# Patient Record
Sex: Female | Born: 1970 | Race: Black or African American | Hispanic: No | Smoking: Former smoker
Health system: Southern US, Community
[De-identification: ages and names within clinical notes are randomized; demographics above are authoritative.]

## PROBLEM LIST (undated history)

## (undated) DIAGNOSIS — R011 Cardiac murmur, unspecified: Secondary | ICD-10-CM

## (undated) DIAGNOSIS — M1990 Unspecified osteoarthritis, unspecified site: Secondary | ICD-10-CM

## (undated) DIAGNOSIS — Z923 Personal history of irradiation: Secondary | ICD-10-CM

## (undated) DIAGNOSIS — Z973 Presence of spectacles and contact lenses: Secondary | ICD-10-CM

## (undated) DIAGNOSIS — D573 Sickle-cell trait: Secondary | ICD-10-CM

## (undated) DIAGNOSIS — M25511 Pain in right shoulder: Secondary | ICD-10-CM

## (undated) HISTORY — PX: NECK SURGERY: SHX720

## (undated) HISTORY — PX: TUBAL LIGATION: SHX77

## (undated) HISTORY — DX: Sickle-cell trait: D57.3

---

## 2001-07-27 ENCOUNTER — Inpatient Hospital Stay (HOSPITAL_COMMUNITY): Admission: AD | Admit: 2001-07-27 | Discharge: 2001-07-27 | Payer: Self-pay | Admitting: *Deleted

## 2001-10-27 HISTORY — PX: TUBAL LIGATION: SHX77

## 2001-12-23 ENCOUNTER — Ambulatory Visit (HOSPITAL_COMMUNITY): Admission: RE | Admit: 2001-12-23 | Discharge: 2001-12-23 | Payer: Self-pay | Admitting: *Deleted

## 2002-03-29 ENCOUNTER — Inpatient Hospital Stay (HOSPITAL_COMMUNITY): Admission: AD | Admit: 2002-03-29 | Discharge: 2002-03-31 | Payer: Self-pay | Admitting: *Deleted

## 2002-05-07 ENCOUNTER — Inpatient Hospital Stay (HOSPITAL_COMMUNITY): Admission: AD | Admit: 2002-05-07 | Discharge: 2002-05-07 | Payer: Self-pay | Admitting: *Deleted

## 2002-05-08 ENCOUNTER — Inpatient Hospital Stay (HOSPITAL_COMMUNITY): Admission: AD | Admit: 2002-05-08 | Discharge: 2002-05-08 | Payer: Self-pay | Admitting: *Deleted

## 2002-05-19 ENCOUNTER — Encounter (INDEPENDENT_AMBULATORY_CARE_PROVIDER_SITE_OTHER): Payer: Self-pay

## 2002-05-19 ENCOUNTER — Inpatient Hospital Stay (HOSPITAL_COMMUNITY): Admission: AD | Admit: 2002-05-19 | Discharge: 2002-05-23 | Payer: Self-pay | Admitting: *Deleted

## 2002-05-26 ENCOUNTER — Inpatient Hospital Stay (HOSPITAL_COMMUNITY): Admission: AD | Admit: 2002-05-26 | Discharge: 2002-05-26 | Payer: Self-pay | Admitting: *Deleted

## 2002-10-11 ENCOUNTER — Encounter: Payer: Self-pay | Admitting: Emergency Medicine

## 2002-10-11 ENCOUNTER — Emergency Department (HOSPITAL_COMMUNITY): Admission: EM | Admit: 2002-10-11 | Discharge: 2002-10-11 | Payer: Self-pay | Admitting: Emergency Medicine

## 2004-11-19 ENCOUNTER — Other Ambulatory Visit: Admission: RE | Admit: 2004-11-19 | Discharge: 2004-11-19 | Payer: Self-pay | Admitting: Obstetrics and Gynecology

## 2004-11-20 ENCOUNTER — Other Ambulatory Visit: Admission: RE | Admit: 2004-11-20 | Discharge: 2004-11-20 | Payer: Self-pay | Admitting: Obstetrics and Gynecology

## 2008-08-27 ENCOUNTER — Emergency Department (HOSPITAL_COMMUNITY): Admission: EM | Admit: 2008-08-27 | Discharge: 2008-08-27 | Payer: Self-pay | Admitting: Emergency Medicine

## 2008-11-17 ENCOUNTER — Emergency Department (HOSPITAL_COMMUNITY): Admission: EM | Admit: 2008-11-17 | Discharge: 2008-11-17 | Payer: Self-pay | Admitting: Emergency Medicine

## 2011-03-14 NOTE — Op Note (Signed)
[facility]  Patient:    Emily Montoya, Emily Montoya Visit Number: [id number] MRN: [id number]          Service Type: OBS Location: 910A 9128 01 Attending Physician:  Enid Cutter Dictated by:   Mary Sella. Orlene Erm, M.D. Admit Date:  05/19/2002                             Operative Report  NO DICTATION Dictated by:   Mary Sella. Orlene Erm, M.D. Attending Physician:  Enid Cutter DD:  05/20/02 TD:  05/24/02 Job: [id number]

## 2011-03-14 NOTE — Discharge Summary (Signed)
[facility]  Patient:    Emily Montoya, Emily Montoya Visit Number: [id number] MRN: [id number]          Service Type: OBS Location: 910A 9109 01 Attending Physician:  Michaelle Copas Dictated by:   Juanell Fairly, M.D. Admit Date:  03/29/2002 Discharge Date: 03/31/2002                             Discharge Summary  DISCHARGE DIAGNOSIS:          Preterm labor.  DISCHARGE INSTRUCTIONS:       Labor precautions, instructions to refrain for sexual activity or any activity which would cause an organism and a discharge appointment for [facility] on Wednesday, June 11th.  HOSPITAL COURSE:              The patient is a 40 year old gravida 9, P2-0-6-2, who presented at 32-5/7 weeks with contractions for approximately 11 hours.  On admission, the patients tocometer showed contractions every 3 to 4 minutes, baseline heart rate of 140, reactive with good variability and no decelerations, and her cervical exam showed her internal os to be closed, her cervix to be thick and high.  The patient was admitted for preterm labor, placed on Unasyn and terbutaline.  The patient received two days of IV Unasyn and terbutaline overnight.  On the 4th, the patient was given ibuprofen 600 mg q.6h. p.r.n. and on the 5th, having made no cervical change, the patient was discharged home. Dictated by:   Juanell Fairly, M.D. Attending Physician:  Michaelle Copas DD:  04/24/02 TD:  04/26/02 Job: [id number]

## 2011-03-14 NOTE — Discharge Summary (Signed)
NAME:  Emily Montoya, Emily Montoya                       ACCOUNT NO.:  [id number]   MEDICAL RECORD NO.:  [id number]                   PATIENT TYPE:  INP   LOCATION:  9128                                 FACILITY:  [facility]   PHYSICIAN:  Enid Cutter, M.D.                  DATE OF BIRTH:  1971/10/12   DATE OF ADMISSION:  05/19/2002  DATE OF DISCHARGE:  05/23/2002                                 DISCHARGE SUMMARY   ADMISSION DIAGNOSIS:  A 40 year old, G9-P2-0-6-2 at 40 weeks with onset of  labor.   DISCHARGE DIAGNOSIS:  A 40 year old G9-P3-0-6-3, postoperative day #3 status  post a primary cesarean section of a viable boy for failure to progress and  nonreassuring fetal heart rate, BTL.   BRIEF HOSPITAL COURSE:  The patient is a 40 year old G9-P2-0-6-2 presenting  at 40 weeks in labor with contractions and bloody show.  Fetal heart rate  was reassuring but not reactive.  She was in latent labor.  She was admitted  to Labor & Delivery.  At 2115 on 05/19/02, an ARO was performed and found to  have meconium fluid and a large amount of dark blood.  Fetal scalp electrode  was applied and late decelerations were noted with each contraction.  By  2215, the fetal heart rate improved and was reassuring.  However, on  05/20/02 at 0030, the patient's fetal heart rate showed some moderate to  deep variable decelerations and an IUPC was placed with a plan of amnio  infusion.  The patient's cervix was 7-8 cm, abdominus anterior and 75%  effaced.  At 0210, the patient was noted to have a fetal heart rate with  mild to deep variables with late components and intermittent late  decelerations and __________ablity.  At that point, terbutaline 0.25 mg  subcutaneously was given.  At 0414 on 05/20/02, a viable female infant was  delivered by primary cesarean section, performed by Dr. Orlene Erm.  Apgars were  3 at 1 minute, 9 at 3 minutes and 9 at 5 minutes.  The patient also  underwent bilateral tubal sterilization at the  same time.  The procedure was  without complications and a procedure note should be dictated in the record.   Following the cesarean section, ampicillin and clindamycin were initiated  for a fever of 101.3.  The patient will continue on antibiotics until she  was afebrile for 24 hours, which was postoperative day #2.   At discharge, the patient was doing well.  Her incision was clean, dry and  intact with staples, with no sign of infection.  The patient was bottle  feeding and circumcision was performed. There were no signs or symptoms of  endometritis.   DISCHARGE INSTRUCTIONS:  The patient was discharged home with a prescription  for Tylenol with codeine elixir 12/120 per 5 mg.  She is to take 2-4  teaspoons q.4h. as needed, dispensed 50 cc.  This  was given due to the fact  that the patient has difficulty in swallowing large pills.  She was also  given a prescription for ferrous sulfate 325 mg tablets, one p.o. b.i.d. for  anemia of 7.7.  She was also written a prescription for Colace 100 mg p.o.  b.i.d. p.r.n. constipation.  The patient was discharged with staples in  place, mostly due to an overlapping panus.  She was instructed to return to  the MAU in 3 days for removal of the staples.  Otherwise, she is to return  to [facility] for routine follow-up care postpartum.  Wound care  instructions, diet instructions, activity and sexual activity were per  routine.  To call for increased bleeding, passing clots, frequent or painful  urination, or foul-smelling vaginal discharge or fever or chills.     Robbi Spells DICTATOR                          Enid Cutter, M.D.    DD/MEDQ  D:  05/23/2002  T:  05/30/2002  Job:  [id number]

## 2011-03-14 NOTE — Op Note (Signed)
NAME:  Emily Montoya, Emily Montoya                       ACCOUNT NO.:  [id number]   MEDICAL RECORD NO.:  [id number]                   PATIENT TYPE:  INP   LOCATION:  9128                                 FACILITY:  [facility]   PHYSICIAN:  Enid Cutter, M.D.                  DATE OF BIRTH:  16-Jan-1971   DATE OF PROCEDURE:  05/20/2002  DATE OF DISCHARGE:  05/23/2002                                 OPERATIVE REPORT   PREOPERATIVE DIAGNOSIS:  A 40 year old gravida 3, para 2 black female at  term with arrest of labor and nonreassuring fetal tracing, patient with  undesired fertility.   POSTOPERATIVE DIAGNOSIS:  A 40 year old gravida 3, para 2 black female at  term with arrest of labor and nonreassuring fetal tracing, patient with  undesired fertility.   OPERATION:  Primary low transverse C-section followed by tubal ligation.   SURGEON:  Enid Cutter, M.D.   ASSISTANTMarland Kitchen  Wiliam Ke, C.N.M.   ANESTHESIA:  Epidural.   COMPLICATIONS:  None.   SPECIMENS:  Cord gases, cord blood, placenta, and bilateral segments of  fallopian tube.   ESTIMATED BLOOD LOSS:  800 cc.   DISPOSITION:  Recovery room.   FINDINGS:  Viable female infant delivered at 4:14 a.m. with Apgars of 3 at one  minute, 9 at three minutes, and 9 at five minutes, weighing 7 pounds 10  ounces.  Arterial cord pH 7.18.   INDICATIONS FOR PROCEDURE:  The patient is a 40 year old gravida 3, para 2  who presented at term in labor.  She progressed to 8 cm spontaneously.  Her  labor progress slowed after that.  She was internalized with IUPC and was  found to have contractions.  The patient continued to progress to anterior  lip.  She developed a fever.  At this point, there was some fetal  tachycardia and some very variable decelerations.  The patient was given  terbutaline to rest in recovery, and then pushing was attempted to see if  she could push the head past the anterior lip of the cervix.  The patient  did not progress in labor and  continued to have nonreassuring fetal tracing  with repetitive deep variable decelerations.   The decision was made to perform a primary low transverse C-section.  The  patient was counseled on the risk of bleeding, infection, and injury to  internal organs.  In addition, she was counseled on the risk of tubal  ligation including risk of failure 1:200, increased risk of ectopic  gestation, and the permanence of the procedure.  In addition, she was  counseled on alternative forms of birth control.  The patient desired to  proceed.   DESCRIPTION OF PROCEDURE:  The patient was in the operating room.  Her  epidural anesthesia was bolused.  She was prepped and draped in sterile  fashion.  A Pfannenstiel incision was performed with a scalpel and carried  down to underlying fascia.  The fascia was then dissected sharply with Mayo  scissors.  The fascia was then separated from the underlying rectus muscle  bellies, and the peritoneum was entered sharply.  The peritoneum was  extended both superiorly and inferiorly, and the bladder blade was placed.  The bladder flap was created with sharp and blunt dissection.   The bladder blade was replaced, and the uterus was scored with a scalpel.  The uterine incision was performed in the midline, and the infant's head was  delivered.  There was a tight nuchal cord at the time of delivery.  The  infant was DeLee suctioned on the operative field secondary to thick  meconium.  The infant was delivered through the operative incision, and the  cord was clamped and cut.  The infant was handed off to awaiting neonatal  team for resuscitation.  The cord gases and cord blood were drawn.  The  placenta was extracted and sent to pathology.  Uterus was curetted with dry  laps and sponge and externalized.  The uterine incision was repaired with  running locking stitch of 0 chromic.  Several figure-of-eight sutures were  used to obtain final hemostasis.   The  patient's left fallopian tube was identified and grasped with a Babcock  clamp.  A 0 plain gut suture was used to ligate a portion of the tube, and 1  cm segment of tube was excised.  The patient's right fallopian tube was  identified in similar fashion.  It was ligated and segment of tube excised  in similar fashion.  The uterus was returned to the abdominal cavity, and  the tubal sites were inspected and noted to be hemostatic.  The pericolic  gutters were emptied of clot and debris.  The uterine incision was inspected  and noted to be hemostatic.  The fascia was then closed with running stitch  of 0 Vicryl.  The subcutaneous tissues were irrigated with copious amounts  of saline.  The skin edges were reapproximated with staples.  The patient  was taken to the recovery room in stable condition.  Sponge, instrument, and  needle counts were correct at the end of the procedure.                                                Enid Cutter, M.D.    EMH/MEDQ  D:  05/20/2002  T:  05/26/2002  Job:  [id number]   cc:   Wiliam Ke, C.N.M.

## 2018-09-07 ENCOUNTER — Other Ambulatory Visit: Payer: Self-pay

## 2018-09-07 ENCOUNTER — Ambulatory Visit (HOSPITAL_COMMUNITY)
Admission: EM | Admit: 2018-09-07 | Discharge: 2018-09-07 | Disposition: A | Discharge status: Home / Self Care | Payer: Self-pay | Attending: Family Medicine | Admitting: Family Medicine

## 2018-09-07 ENCOUNTER — Encounter (HOSPITAL_COMMUNITY): Payer: Self-pay | Admitting: Emergency Medicine

## 2018-09-07 DIAGNOSIS — R21 Rash and other nonspecific skin eruption: Secondary | ICD-10-CM

## 2018-09-07 MED ORDER — CLOTRIMAZOLE 1 % EX CREA: TOPICAL_CREAM | CUTANEOUS | 0 refills | Status: DC

## 2018-09-07 NOTE — ED Triage Notes (Addendum)
Patient reports noticing pain to lateral right foot, now has a large blister to right foot, lateral.  Denies any new shoes. Symptoms started 2 days ago  Patient also concerned for fine rash to face and few areas of discolored skin for 2 days as well

## 2018-09-07 NOTE — Discharge Instructions (Addendum)
It was nice meeting you!!  We believe this may be some type of fungal infection on the foot We will try clotrimazole cream to treat.  Keep covered and avoid friction with that area of the foot.  You can try some hydrocortisone cream OTC for your face to see if this helps. Zyrtec or benadryl for itching.

## 2018-09-07 NOTE — ED Provider Notes (Signed)
[facility]    CSN: [id number] Arrival date & time: 09/07/18  1336     History   Chief Complaint Chief Complaint  Patient presents with  . Blister    HPI Emily Montoya is a 47 y.o. female.   Patient is a 47 year old female presents for right lateral foot tenderness, peeling of skin with  blister.  This is been going on for the past 3 to 4 days.  The problem initially started with a tenderness to the foot where later a lesion ans then blister formed.  The blister popped and drained clear fluid  and the next day another blister appeared.  The area is very itchy and she has peeling of the foot. She did recently get a Financial controller. She has not been putting anything on the rash. She has not worn any shoes that cause friction   Denies any fever, joint pain. Denies any recent changes in lotions, detergents, foods or other possible irritants. No recent travel. Nobody else at home has the rash. Patient has been outside but denies any contact with plants or insects. No new foods or medications.   ROS per HPI      History reviewed. No pertinent past medical history.  There are no active problems to display for this patient.   History reviewed. No pertinent surgical history.  OB History   None      Home Medications    Prior to Admission medications   Medication Sig Start Date End Date Taking? Authorizing Provider  clotrimazole (LOTRIMIN) 1 % cream Apply to affected area 2 times daily 09/07/18   Orvan July, NP    Family History History reviewed. No pertinent family history.  Social History Social History   Tobacco Use  . Smoking status: Current Every Day Smoker  Substance Use Topics  . Alcohol use: Yes  . Drug use: Never     Allergies   Patient has no known allergies.   Review of Systems Review of Systems   Physical Exam Triage Vital Signs ED Triage Vitals [09/07/18 1432]  Enc Vitals Group     BP 114/61     Pulse Rate 66   Resp 18     Temp 98.4 F (36.9 C)     Temp Source Oral     SpO2 100 %     Weight      Height      Head Circumference      Peak Flow      Pain Score      Pain Loc      Pain Edu?      Excl. in [place]?    No data found.  Updated Vital Signs BP 114/61 (BP Location: Left Arm)   Pulse 66   Temp 98.4 F (36.9 C) (Oral)   Resp 18   LMP 09/07/2018   SpO2 100%   Visual Acuity Right Eye Distance:   Left Eye Distance:   Bilateral Distance:    Right Eye Near:   Left Eye Near:    Bilateral Near:     Physical Exam  Constitutional: She is oriented to person, place, and time. She appears well-developed and well-nourished.  Very pleasant. Non toxic or ill appearing.   HENT:  Head: Normocephalic and atraumatic.  Eyes: Conjunctivae are normal.  Neck: Normal range of motion.  Pulmonary/Chest: Effort normal.  Musculoskeletal: Normal range of motion.  Neurological: She is alert and oriented to person, place, and time.  Skin: Skin  is warm and dry. Rash noted.  See picture for detail for right foot rash   fine papular rash to bilateral cheeks with erythema.   Psychiatric: She has a normal mood and affect.  Nursing note and vitals reviewed.       UC Treatments / Results  Labs (all labs ordered are listed, but only abnormal results are displayed) Labs Reviewed - No data to display  EKG None  Radiology No results found.  Procedures Procedures (including critical care time)  Medications Ordered in UC Medications - No data to display  Initial Impression / Assessment and Plan / UC Course  I have reviewed the triage vital signs and the nursing notes.  Pertinent labs & imaging results that were available during my care of the patient were reviewed by me and considered in my medical decision making (see chart for details).     Pt is a 47 year old female with rash to foot.  Differentials include; bullous  impetigo, fungal, friction blister Contact dermatitis. All of these  can have skin peeling and cause blisters. Will go ahead and treat with fungal cream and steroid to see if this helps Follow up as needed for continued or worsening symptoms  Final Clinical Impressions(s) / UC Diagnoses   Final diagnoses:  Rash     Discharge Instructions     It was nice meeting you!!  We believe this may be some type of fungal infection on the foot We will try clotrimazole cream to treat.  Keep covered and avoid friction with that area of the foot.  You can try some hydrocortisone cream OTC for your face to see if this helps. Zyrtec or benadryl for itching.     ED Prescriptions    Medication Sig Dispense Auth. Provider   clotrimazole (LOTRIMIN) 1 % cream Apply to affected area 2 times daily 15 g Loura Halt A, NP     Controlled Substance Prescriptions [place] Controlled Substance Registry consulted? no   Orvan July, NP 09/07/18 2215

## 2019-06-14 ENCOUNTER — Emergency Department (HOSPITAL_COMMUNITY)
Admission: EM | Admit: 2019-06-14 | Discharge: 2019-06-14 | Disposition: A | Discharge status: Home / Self Care | Payer: Self-pay | Attending: Emergency Medicine | Admitting: Emergency Medicine

## 2019-06-14 ENCOUNTER — Emergency Department (HOSPITAL_COMMUNITY): Payer: Self-pay

## 2019-06-14 ENCOUNTER — Encounter (HOSPITAL_COMMUNITY): Payer: Self-pay | Admitting: Emergency Medicine

## 2019-06-14 ENCOUNTER — Other Ambulatory Visit: Payer: Self-pay

## 2019-06-14 DIAGNOSIS — R109 Unspecified abdominal pain: Secondary | ICD-10-CM | POA: Insufficient documentation

## 2019-06-14 DIAGNOSIS — R1110 Vomiting, unspecified: Secondary | ICD-10-CM | POA: Insufficient documentation

## 2019-06-14 DIAGNOSIS — F17200 Nicotine dependence, unspecified, uncomplicated: Secondary | ICD-10-CM | POA: Insufficient documentation

## 2019-06-14 DIAGNOSIS — R8271 Bacteriuria: Secondary | ICD-10-CM | POA: Insufficient documentation

## 2019-06-14 LAB — CBC WITH DIFFERENTIAL/PLATELET
Abs Immature Granulocytes: 0.12 10*3/uL — ABNORMAL HIGH (ref 0.00–0.07)
Basophils Absolute: 0 10*3/uL (ref 0.0–0.1)
Basophils Relative: 0 %
Eosinophils Absolute: 0 10*3/uL (ref 0.0–0.5)
Eosinophils Relative: 0 %
HCT: 43.3 % (ref 36.0–46.0)
Hemoglobin: 14 g/dL (ref 12.0–15.0)
Immature Granulocytes: 1 %
Lymphocytes Relative: 3 %
Lymphs Abs: 0.3 10*3/uL — ABNORMAL LOW (ref 0.7–4.0)
MCH: 27.6 pg (ref 26.0–34.0)
MCHC: 32.3 g/dL (ref 30.0–36.0)
MCV: 85.2 fL (ref 80.0–100.0)
Monocytes Absolute: 0.3 10*3/uL (ref 0.1–1.0)
Monocytes Relative: 3 %
Neutro Abs: 8.2 10*3/uL — ABNORMAL HIGH (ref 1.7–7.7)
Neutrophils Relative %: 93 %
Platelets: 308 10*3/uL (ref 150–400)
RBC: 5.08 MIL/uL (ref 3.87–5.11)
RDW: 13.7 % (ref 11.5–15.5)
WBC: 8.9 10*3/uL (ref 4.0–10.5)
nRBC: 0 % (ref 0.0–0.2)

## 2019-06-14 LAB — COMPREHENSIVE METABOLIC PANEL
ALT: 15 U/L (ref 0–44)
AST: 22 U/L (ref 15–41)
Albumin: 3.8 g/dL (ref 3.5–5.0)
Alkaline Phosphatase: 53 U/L (ref 38–126)
Anion gap: 12 (ref 5–15)
BUN: 10 mg/dL (ref 6–20)
CO2: 20 mmol/L — ABNORMAL LOW (ref 22–32)
Calcium: 8.8 mg/dL — ABNORMAL LOW (ref 8.9–10.3)
Chloride: 108 mmol/L (ref 98–111)
Creatinine, Ser: 0.72 mg/dL (ref 0.44–1.00)
GFR calc Af Amer: >60 mL/min (ref >60)
GFR calc non Af Amer: >60 mL/min (ref >60)
Glucose, Bld: 92 mg/dL (ref 70–99)
Potassium: 3.3 mmol/L — ABNORMAL LOW (ref 3.5–5.1)
Sodium: 140 mmol/L (ref 135–145)
Total Bilirubin: 0.6 mg/dL (ref 0.3–1.2)
Total Protein: 6.8 g/dL (ref 6.5–8.1)

## 2019-06-14 LAB — URINALYSIS, ROUTINE W REFLEX MICROSCOPIC
Bilirubin Urine: NEGATIVE
Glucose, UA: NEGATIVE mg/dL
Hgb urine dipstick: NEGATIVE
Ketones, ur: NEGATIVE mg/dL
Leukocytes,Ua: NEGATIVE
Nitrite: POSITIVE — AB
Protein, ur: NEGATIVE mg/dL
Specific Gravity, Urine: 1.01 (ref 1.005–1.030)
pH: 6 (ref 5.0–8.0)

## 2019-06-14 LAB — PREGNANCY, URINE: Preg Test, Ur: NEGATIVE

## 2019-06-14 LAB — LIPASE, BLOOD: Lipase: 20 U/L (ref 11–51)

## 2019-06-14 MED ORDER — SODIUM CHLORIDE 0.9 % IV BOLUS
1000.0000 mL | Freq: Once | INTRAVENOUS | Status: AC
  Administered 2019-06-14: 1000 mL via INTRAVENOUS

## 2019-06-14 MED ORDER — MORPHINE SULFATE (PF) 4 MG/ML IV SOLN
4.0000 mg | Freq: Once | INTRAVENOUS | Status: AC
  Administered 2019-06-14: 4 mg via INTRAVENOUS
  Filled 2019-06-14: qty 1

## 2019-06-14 MED ORDER — KETOROLAC TROMETHAMINE 30 MG/ML IJ SOLN
15.0000 mg | Freq: Once | INTRAMUSCULAR | Status: AC
  Administered 2019-06-14: 15 mg via INTRAVENOUS
  Filled 2019-06-14: qty 1

## 2019-06-14 MED ORDER — OXYCODONE HCL 5 MG PO TABS
5.0000 mg | ORAL_TABLET | Freq: Once | ORAL | Status: AC
  Administered 2019-06-14: 5 mg via ORAL
  Filled 2019-06-14: qty 1

## 2019-06-14 MED ORDER — ONDANSETRON HCL 4 MG/2ML IJ SOLN
4.0000 mg | Freq: Once | INTRAMUSCULAR | Status: AC
  Administered 2019-06-14: 4 mg via INTRAVENOUS
  Filled 2019-06-14: qty 2

## 2019-06-14 MED ORDER — CEPHALEXIN 500 MG PO CAPS: 500.0000 mg | ORAL_CAPSULE | Freq: Four times a day (QID) | ORAL | 0 refills | Status: DC

## 2019-06-14 NOTE — ED Provider Notes (Signed)
[facility] DEPT Provider Note   CSN: [id number] Arrival date & time: 06/14/19  0404    History   Chief Complaint Chief Complaint  Patient presents with  . Flank Pain    HPI Emily Montoya is a 48 y.o. female.     48 yo F with a cc R flank pain.  Going on for the past couple hours.  Occurred during a sexual encounter.  Having some emesis.  Pointing to the R abdomen just lateral to the umbilicus.   The history is provided by the patient.  Flank Pain This is a new problem. The current episode started less than 1 hour ago. The problem occurs constantly. The problem has not changed since onset.Pertinent negatives include no chest pain, no abdominal pain, no headaches and no shortness of breath. Nothing aggravates the symptoms. Nothing relieves the symptoms. She has tried nothing for the symptoms. The treatment provided no relief.    History reviewed. No pertinent past medical history.  There are no active problems to display for this patient.   History reviewed. No pertinent surgical history.   OB History   No obstetric history on file.      Home Medications    Prior to Admission medications   Medication Sig Start Date End Date Taking? Authorizing Provider  cephALEXin (KEFLEX) 500 MG capsule Take 1 capsule (500 mg total) by mouth 4 (four) times daily. 06/14/19   Deno Etienne, DO  clotrimazole (LOTRIMIN) 1 % cream Apply to affected area 2 times daily 09/07/18   Orvan July, NP    Family History No family history on file.  Social History Social History   Tobacco Use  . Smoking status: Current Every Day Smoker  . Smokeless tobacco: Never Used  Substance Use Topics  . Alcohol use: Yes  . Drug use: Never     Allergies   Patient has no known allergies.   Review of Systems Review of Systems  Constitutional: Negative for chills and fever.  HENT: Negative for congestion and rhinorrhea.   Eyes: Negative for redness and visual  disturbance.  Respiratory: Negative for shortness of breath and wheezing.   Cardiovascular: Negative for chest pain and palpitations.  Gastrointestinal: Negative for abdominal pain, nausea and vomiting.  Genitourinary: Positive for flank pain. Negative for dysuria and urgency.  Musculoskeletal: Negative for arthralgias and myalgias.  Skin: Negative for pallor and wound.  Neurological: Negative for dizziness and headaches.     Physical Exam Updated Vital Signs BP 110/70 (BP Location: Left Arm)   Pulse (!) 102   Temp 98.8 F (37.1 C) (Oral)   Resp 19   SpO2 100%   Physical Exam Vitals signs and nursing note reviewed.  Constitutional:      General: She is not in acute distress.    Appearance: She is well-developed. She is not diaphoretic.  HENT:     Head: Normocephalic and atraumatic.  Eyes:     Pupils: Pupils are equal, round, and reactive to light.  Neck:     Musculoskeletal: Normal range of motion and neck supple.  Cardiovascular:     Rate and Rhythm: Normal rate and regular rhythm.     Heart sounds: No murmur. No friction rub. No gallop.   Pulmonary:     Effort: Pulmonary effort is normal.     Breath sounds: No wheezing or rales.  Abdominal:     General: There is no distension.     Palpations: Abdomen is soft.  Tenderness: There is abdominal tenderness.     Comments: Mild RLQ tenderness, pain worst just below the R costal margin about the mid axillary line.   Musculoskeletal:        General: No tenderness.  Skin:    General: Skin is warm and dry.  Neurological:     Mental Status: She is alert and oriented to person, place, and time.  Psychiatric:        Behavior: Behavior normal.      ED Treatments / Results  Labs (all labs ordered are listed, but only abnormal results are displayed) Labs Reviewed  URINALYSIS, ROUTINE W REFLEX MICROSCOPIC - Abnormal; Notable for the following components:      Result Value   Nitrite POSITIVE (*)    Bacteria, UA MANY (*)     All other components within normal limits  CBC WITH DIFFERENTIAL/PLATELET - Abnormal; Notable for the following components:   Neutro Abs 8.2 (*)    Lymphs Abs 0.3 (*)    Abs Immature Granulocytes 0.12 (*)    All other components within normal limits  COMPREHENSIVE METABOLIC PANEL - Abnormal; Notable for the following components:   Potassium 3.3 (*)    CO2 20 (*)    Calcium 8.8 (*)    All other components within normal limits  PREGNANCY, URINE  LIPASE, BLOOD    EKG None  Radiology Ct Renal Stone Study  Result Date: 06/14/2019 CLINICAL DATA:  Right flank pain EXAM: CT ABDOMEN AND PELVIS WITHOUT CONTRAST TECHNIQUE: Multidetector CT imaging of the abdomen and pelvis was performed following the standard protocol without IV contrast. COMPARISON:  None. FINDINGS: Lower chest:  Negative Hepatobiliary: No focal liver abnormality.No evidence of biliary obstruction or stone. Pancreas: Unremarkable. Spleen: Unremarkable. Adrenals/Urinary Tract: Negative adrenals. Right hydronephrosis, renal expansion, and perinephric edema. No ureteral calculus is seen - right pelvic and flank calcifications are extra ureteral and attributed to phleboliths. There is a stone within the dependent bladder which measures 4 mm, presumably recently passed. Three right renal calculi measuring up to 5 mm in the lower pole. Negative left kidney. Right upper pole cyst. Stomach/Bowel:  No obstruction. No appendicitis. Vascular/Lymphatic: No acute vascular abnormality. No mass or adenopathy. Reproductive:No pathologic findings. Other: No ascites or pneumoperitoneum. Musculoskeletal: No acute abnormalities. IMPRESSION: 1. Right hydroureteronephrosis likely from recently passed calculus that is layering in the urinary bladder. No ureteral calculus currently. 2. Right renal calculi measuring up to 5 mm. Electronically Signed   By: Monte Fantasia M.D.   On: 06/14/2019 06:55    Procedures Procedures (including critical care time)   Medications Ordered in ED Medications  ketorolac (TORADOL) 30 MG/ML injection 15 mg (has no administration in time range)  oxyCODONE (Oxy IR/ROXICODONE) immediate release tablet 5 mg (has no administration in time range)  morphine 4 MG/ML injection 4 mg (4 mg Intravenous Given 06/14/19 0617)  ondansetron (ZOFRAN) injection 4 mg (4 mg Intravenous Given 06/14/19 0617)  sodium chloride 0.9 % bolus 1,000 mL (1,000 mLs Intravenous New Bag/Given 06/14/19 0616)     Initial Impression / Assessment and Plan / ED Course  I have reviewed the triage vital signs and the nursing notes.  Pertinent labs & imaging results that were available during my care of the patient were reviewed by me and considered in my medical decision making (see chart for details).        48 yo F with a chief complaint of sudden onset right flank pain.  This occurred while she was having  a sexual encounter this evening.  On my exam the patient points to the right of the umbilicus that her pain is more to the mid axillary line.  I wonder if this is a muscular strain.  With her significant pain complaints and vomiting and tachycardia will obtain a CT scan to further evaluate.  UA shows too numerous to count bacteria and nitrite positive.  She has no significant urinary symptoms.  CT scan shows hydroureter on the right.  No obvious stone.  She has a stone in the bladder that is more consistent with possible recently passed stone.  On reassessment the patient continues to state that she has having severe pain.  Still reproducible on the right side.  I suspect that this is a muscular strain, though we will treat her with antibiotics for possible pyelonephritis.  We will have her take Tylenol and ibuprofen at home.  PCP follow-up.  7:05 AM:  I have discussed the diagnosis/risks/treatment options with the patient and believe the pt to be eligible for discharge home to follow-up with PCP. We also discussed returning to the ED immediately if  new or worsening sx occur. We discussed the sx which are most concerning (e.g., sudden worsening pain, fever, inability to tolerate by mouth) that necessitate immediate return. Medications administered to the patient during their visit and any new prescriptions provided to the patient are listed below.  Medications given during this visit Medications  ketorolac (TORADOL) 30 MG/ML injection 15 mg (has no administration in time range)  oxyCODONE (Oxy IR/ROXICODONE) immediate release tablet 5 mg (has no administration in time range)  morphine 4 MG/ML injection 4 mg (4 mg Intravenous Given 06/14/19 0617)  ondansetron (ZOFRAN) injection 4 mg (4 mg Intravenous Given 06/14/19 0617)  sodium chloride 0.9 % bolus 1,000 mL (1,000 mLs Intravenous New Bag/Given 06/14/19 0277)     The patient appears reasonably screen and/or stabilized for discharge and I doubt any other medical condition or other [facility] requiring further screening, evaluation, or treatment in the ED at this time prior to discharge.    Final Clinical Impressions(s) / ED Diagnoses   Final diagnoses:  Right flank pain  Bacteriuria    ED Discharge Orders         Ordered    cephALEXin (KEFLEX) 500 MG capsule  4 times daily     06/14/19 0626           Deno Etienne, DO 06/14/19 [phone]

## 2019-06-14 NOTE — Discharge Instructions (Signed)
Take 4 over the counter ibuprofen tablets 3 times a day or 2 over-the-counter naproxen tablets twice a day for pain. Also take tylenol 1000mg(2 extra strength) four times a day.    

## 2019-06-14 NOTE — ED Triage Notes (Signed)
Patient here via EMS with complaints of right upper flank pain after drinking and having sex with friend. Ambulatory. Patient screaming in triage.

## 2019-06-14 NOTE — ED Notes (Signed)
Pt refused blood work and urine specimen "due to pain". Pt informed refusing labs would delay pt care. Pt requesting pain medicine.

## 2020-10-27 DIAGNOSIS — Z87442 Personal history of urinary calculi: Secondary | ICD-10-CM

## 2020-10-27 HISTORY — DX: Personal history of urinary calculi: Z87.442

## 2021-02-27 ENCOUNTER — Emergency Department (HOSPITAL_COMMUNITY): Payer: 59

## 2021-02-27 ENCOUNTER — Other Ambulatory Visit: Payer: Self-pay

## 2021-02-27 ENCOUNTER — Emergency Department (HOSPITAL_COMMUNITY)
Admission: EM | Admit: 2021-02-27 | Discharge: 2021-02-28 | Disposition: A | Discharge status: Home / Self Care | Payer: 59 | Attending: Emergency Medicine | Admitting: Emergency Medicine

## 2021-02-27 ENCOUNTER — Encounter (HOSPITAL_COMMUNITY): Payer: Self-pay

## 2021-02-27 DIAGNOSIS — M25551 Pain in right hip: Secondary | ICD-10-CM

## 2021-02-27 DIAGNOSIS — R109 Unspecified abdominal pain: Secondary | ICD-10-CM | POA: Insufficient documentation

## 2021-02-27 DIAGNOSIS — F17200 Nicotine dependence, unspecified, uncomplicated: Secondary | ICD-10-CM | POA: Insufficient documentation

## 2021-02-27 LAB — URINALYSIS, ROUTINE W REFLEX MICROSCOPIC
Bilirubin Urine: NEGATIVE
Glucose, UA: NEGATIVE mg/dL
Ketones, ur: NEGATIVE mg/dL
Nitrite: NEGATIVE
Protein, ur: NEGATIVE mg/dL
Specific Gravity, Urine: 1.009 (ref 1.005–1.030)
pH: 6 (ref 5.0–8.0)

## 2021-02-27 LAB — BASIC METABOLIC PANEL
Anion gap: 6 (ref 5–15)
BUN: 10 mg/dL (ref 6–20)
CO2: 23 mmol/L (ref 22–32)
Calcium: 8.6 mg/dL — ABNORMAL LOW (ref 8.9–10.3)
Chloride: 110 mmol/L (ref 98–111)
Creatinine, Ser: 0.82 mg/dL (ref 0.44–1.00)
GFR, Estimated: >60 mL/min (ref >60)
Glucose, Bld: 88 mg/dL (ref 70–99)
Potassium: 3.6 mmol/L (ref 3.5–5.1)
Sodium: 139 mmol/L (ref 135–145)

## 2021-02-27 LAB — CBC WITH DIFFERENTIAL/PLATELET
Abs Immature Granulocytes: 0.03 10*3/uL (ref 0.00–0.07)
Basophils Absolute: 0 10*3/uL (ref 0.0–0.1)
Basophils Relative: 0 %
Eosinophils Absolute: 0.1 10*3/uL (ref 0.0–0.5)
Eosinophils Relative: 1 %
HCT: 36.1 % (ref 36.0–46.0)
Hemoglobin: 11.4 g/dL — ABNORMAL LOW (ref 12.0–15.0)
Immature Granulocytes: 0 %
Lymphocytes Relative: 18 %
Lymphs Abs: 1.7 10*3/uL (ref 0.7–4.0)
MCH: 27.2 pg (ref 26.0–34.0)
MCHC: 31.6 g/dL (ref 30.0–36.0)
MCV: 86.2 fL (ref 80.0–100.0)
Monocytes Absolute: 1 10*3/uL (ref 0.1–1.0)
Monocytes Relative: 10 %
Neutro Abs: 6.4 10*3/uL (ref 1.7–7.7)
Neutrophils Relative %: 71 %
Platelets: 319 10*3/uL (ref 150–400)
RBC: 4.19 MIL/uL (ref 3.87–5.11)
RDW: 13.4 % (ref 11.5–15.5)
WBC: 9.2 10*3/uL (ref 4.0–10.5)
nRBC: 0 % (ref 0.0–0.2)

## 2021-02-27 LAB — I-STAT BETA HCG BLOOD, ED (MC, WL, AP ONLY): I-stat hCG, quantitative: <5 m[IU]/mL (ref <5)

## 2021-02-27 MED ORDER — MORPHINE SULFATE (PF) 4 MG/ML IV SOLN
8.0000 mg | Freq: Once | INTRAVENOUS | Status: AC
  Administered 2021-02-27: 8 mg via INTRAMUSCULAR
  Filled 2021-02-27: qty 2

## 2021-02-27 MED ORDER — KETOROLAC TROMETHAMINE 30 MG/ML IJ SOLN
15.0000 mg | Freq: Once | INTRAMUSCULAR | Status: AC
  Administered 2021-02-27: 15 mg via INTRAVENOUS
  Filled 2021-02-27: qty 1

## 2021-02-27 MED ORDER — METHOCARBAMOL 1000 MG/10ML IJ SOLN
500.0000 mg | Freq: Once | INTRAVENOUS | Status: AC
  Administered 2021-02-27: 500 mg via INTRAVENOUS
  Filled 2021-02-27: qty 500

## 2021-02-27 MED ORDER — HYDROCODONE-ACETAMINOPHEN 5-325 MG PO TABS: 2.0000 | ORAL_TABLET | Freq: Four times a day (QID) | ORAL | 0 refills | Status: DC | PRN

## 2021-02-27 MED ORDER — ONDANSETRON 8 MG PO TBDP
8.0000 mg | ORAL_TABLET | Freq: Once | ORAL | Status: AC
  Administered 2021-02-27: 8 mg via ORAL
  Filled 2021-02-27: qty 1

## 2021-02-27 MED ORDER — SODIUM CHLORIDE 0.9 % IV SOLN: INTRAVENOUS | Status: DC

## 2021-02-27 MED ORDER — CEPHALEXIN 500 MG PO CAPS: 500.0000 mg | ORAL_CAPSULE | Freq: Two times a day (BID) | ORAL | 0 refills | Status: DC

## 2021-02-27 NOTE — ED Provider Notes (Signed)
[facility] DEPT Provider Note   CSN: [id number] Arrival date & time: 02/27/21  1549     History Chief Complaint  Patient presents with  . Right Hip Pain    Emily Montoya is a 50 y.o. female.  50 year old female presents with several days of right-sided pain localized to her inguinal region.  States that pain is sharp and worse with applying pressure.  Denies any direct trauma.  No abdominal discomfort.  No fever or chills.  No back pain.  No bowel or bladder dysfunction.  Pain radiates over to her right gluteal region.  Has been using ibuprofen at home without relief.  Denies any foot drop        History reviewed. No pertinent past medical history.  There are no problems to display for this patient.   History reviewed. No pertinent surgical history.   OB History   No obstetric history on file.     History reviewed. No pertinent family history.  Social History   Tobacco Use  . Smoking status: Current Every Day Smoker  . Smokeless tobacco: Never Used  Substance Use Topics  . Alcohol use: Yes  . Drug use: Never    Home Medications Prior to Admission medications   Medication Sig Start Date End Date Taking? Authorizing Provider  cephALEXin (KEFLEX) 500 MG capsule Take 1 capsule (500 mg total) by mouth 4 (four) times daily. 06/14/19   Deno Etienne, DO  clotrimazole (LOTRIMIN) 1 % cream Apply to affected area 2 times daily 09/07/18   Orvan July, NP    Allergies    Patient has no known allergies.  Review of Systems   Review of Systems  All other systems reviewed and are negative.   Physical Exam Updated Vital Signs BP 121/80   Pulse 81   Temp 98.9 F (37.2 C) (Oral)   Resp 18   SpO2 99%   Physical Exam Vitals and nursing note reviewed.  Constitutional:      General: She is not in acute distress.    Appearance: Normal appearance. She is well-developed. She is not toxic-appearing.  HENT:     Head: Normocephalic and  atraumatic.  Eyes:     General: Lids are normal.     Conjunctiva/sclera: Conjunctivae normal.     Pupils: Pupils are equal, round, and reactive to light.  Neck:     Thyroid: No thyroid mass.     Trachea: No tracheal deviation.  Cardiovascular:     Rate and Rhythm: Normal rate and regular rhythm.     Heart sounds: Normal heart sounds. No murmur heard. No gallop.   Pulmonary:     Effort: Pulmonary effort is normal. No respiratory distress.     Breath sounds: Normal breath sounds. No stridor. No decreased breath sounds, wheezing, rhonchi or rales.  Abdominal:     General: Bowel sounds are normal. There is no distension.     Palpations: Abdomen is soft.     Tenderness: There is no abdominal tenderness. There is no rebound.    Musculoskeletal:        General: No tenderness. Normal range of motion.     Cervical back: Normal range of motion and neck supple.  Skin:    General: Skin is warm and dry.     Findings: No abrasion or rash.  Neurological:     Mental Status: She is alert and oriented to person, place, and time.     GCS: GCS eye subscore is  4. GCS verbal subscore is 5. GCS motor subscore is 6.     Cranial Nerves: No cranial nerve deficit.     Sensory: No sensory deficit.     Comments: Neurovascular intact at right foot.  No joint swelling of the right knee or right ankle.  No rashes to the skin  Psychiatric:        Speech: Speech normal.        Behavior: Behavior normal.     ED Results / Procedures / Treatments   Labs (all labs ordered are listed, but only abnormal results are displayed) Labs Reviewed - No data to display  EKG None  Radiology No results found.  Procedures Procedures   Medications Ordered in ED Medications  morphine 4 MG/ML injection 8 mg (has no administration in time range)  ondansetron (ZOFRAN-ODT) disintegrating tablet 8 mg (has no administration in time range)    ED Course  I have reviewed the triage vital signs and the nursing  notes.  Pertinent labs & imaging results that were available during my care of the patient were reviewed by me and considered in my medical decision making (see chart for details).    MDM Rules/Calculators/A&P                          Patient medicated for pain here.  Plain films negative.  Will order CT of hip.  Signed out to Dr. Tamera Punt Final Clinical Impression(s) / ED Diagnoses Final diagnoses:  None    Rx / DC Orders ED Discharge Orders    None       Lacretia Leigh, MD 02/27/21 1907

## 2021-02-27 NOTE — ED Provider Notes (Addendum)
Patient care was taken over from Dr. Zenia Resides.  Patient presented with right hip pain.  It is worse with movement of the hip and ambulation.  She had a CT of her hip which shows no acute abnormality.  She is tender on palpation over the inguinal area and a little bit in the lower right pelvic area.  Given this, pelvic ultrasound was performed to rule out torsion.  This was unremarkable other than some fibroids.  She says she has had a history of kidney stones.  Her urine was evaluated which showed some hematuria.  Given her significant pain, I will go ahead and perform a CT to make sure that there is no kidney stone.   CT scan shows no acute abnormality.  Her urine has some suggestions of infection.  We will treat with Keflex.  She was discharged home in good condition.  She was given a prescription for a short course of pain medication.  She was encouraged to follow-up with her PCP.  Return precautions were given.   Malvin Johns, MD 02/27/21 [id number]    Malvin Johns, MD 02/27/21 2351

## 2021-02-27 NOTE — ED Triage Notes (Signed)
Pt BIB EMS home. Pt c/o right hip and right leg pain since Monday night. Pt asking for pain meds upon arrival.  HR 88 152/90 98% RA

## 2021-06-06 ENCOUNTER — Other Ambulatory Visit: Payer: Self-pay | Admitting: Internal Medicine

## 2021-06-06 ENCOUNTER — Ambulatory Visit
Admission: RE | Admit: 2021-06-06 | Discharge: 2021-06-06 | Disposition: A | Discharge status: Home / Self Care | Payer: 59 | Source: Ambulatory Visit | Attending: Internal Medicine | Admitting: Internal Medicine

## 2021-06-06 ENCOUNTER — Other Ambulatory Visit: Payer: Self-pay

## 2021-06-06 DIAGNOSIS — M549 Dorsalgia, unspecified: Secondary | ICD-10-CM

## 2021-07-25 ENCOUNTER — Other Ambulatory Visit: Payer: Self-pay | Admitting: Obstetrics and Gynecology

## 2021-07-25 DIAGNOSIS — N63 Unspecified lump in unspecified breast: Secondary | ICD-10-CM

## 2021-07-27 HISTORY — PX: BREAST BIOPSY: SHX20

## 2021-08-14 ENCOUNTER — Ambulatory Visit
Admission: RE | Admit: 2021-08-14 | Discharge: 2021-08-14 | Disposition: A | Discharge status: Home / Self Care | Payer: 59 | Source: Ambulatory Visit | Attending: Obstetrics and Gynecology | Admitting: Obstetrics and Gynecology

## 2021-08-14 ENCOUNTER — Other Ambulatory Visit: Payer: Self-pay

## 2021-08-14 ENCOUNTER — Other Ambulatory Visit: Payer: Self-pay | Admitting: Obstetrics and Gynecology

## 2021-08-14 DIAGNOSIS — N63 Unspecified lump in unspecified breast: Secondary | ICD-10-CM

## 2021-08-20 ENCOUNTER — Other Ambulatory Visit: Payer: Self-pay

## 2021-08-20 ENCOUNTER — Ambulatory Visit
Admission: RE | Admit: 2021-08-20 | Discharge: 2021-08-20 | Disposition: A | Discharge status: Home / Self Care | Payer: 59 | Source: Ambulatory Visit | Attending: Obstetrics and Gynecology | Admitting: Obstetrics and Gynecology

## 2021-08-20 DIAGNOSIS — N63 Unspecified lump in unspecified breast: Secondary | ICD-10-CM

## 2021-08-20 DIAGNOSIS — C50919 Malignant neoplasm of unspecified site of unspecified female breast: Secondary | ICD-10-CM

## 2021-08-20 HISTORY — DX: Malignant neoplasm of unspecified site of unspecified female breast: C50.919

## 2021-08-29 ENCOUNTER — Telehealth: Payer: Self-pay | Admitting: Hematology

## 2021-08-29 ENCOUNTER — Other Ambulatory Visit: Payer: Self-pay

## 2021-08-29 ENCOUNTER — Ambulatory Visit
Admission: RE | Admit: 2021-08-29 | Discharge: 2021-08-29 | Disposition: A | Discharge status: Home / Self Care | Payer: 59 | Source: Ambulatory Visit | Attending: Radiation Oncology | Admitting: Radiation Oncology

## 2021-08-29 ENCOUNTER — Encounter: Payer: Self-pay | Admitting: Radiation Oncology

## 2021-08-29 DIAGNOSIS — Z170 Estrogen receptor positive status [ER+]: Secondary | ICD-10-CM

## 2021-08-29 DIAGNOSIS — C50211 Malignant neoplasm of upper-inner quadrant of right female breast: Secondary | ICD-10-CM

## 2021-08-29 NOTE — Telephone Encounter (Signed)
Scheduled appt per 1/12 referral. Pt is aware of appt date and time.  °

## 2021-08-29 NOTE — Progress Notes (Signed)
New Breast Cancer Diagnosis: Right Breast UIQ  Did patient present with symptoms (if so, please note symptoms) or screening mammography?:Palpable mass noted for about 6 months or so.  She notes her breast was feeling really heavy, thought it was in association to her period.  Location and Extent of disease :right breast. Located at the 1 o'clock position, measured  1.2 cm in greatest dimension. Adenopathy no.  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 Marlou Starks 08/27/2021 -I have discussed with her in detail the different options for treatment and at this point she has not completely decided between breast conservation and mastectomy. -She will let us know what she decides and then we will move forward with scheduling. -I will refer her to medical and radiation oncology to discuss adjuvant therapy. -I will also refer her to physical therapy for preoperative lymphedema testing.    Medical oncologist, treatment if any:   Dr. Burr Medico 08/30/2021   Family History of Breast/Ovarian/Prostate Cancer: None  Lymphedema issues, if any: No     Pain issues, if any: None    SAFETY ISSUES: Prior radiation? no Pacemaker/ICD? No Possible current pregnancy? Having cycles, No birth control Is the patient on methotrexate? No  Current Complaints / other details:   Patient has decided to have lumpectomy, will reach out to Dr. Ethlyn Gallery office with update.

## 2021-08-29 NOTE — Progress Notes (Addendum)
Radiation Oncology         [phone] ________________________________  Initial Outpatient Consultation - Conducted via telephone due to current COVID-19 concerns for limiting patient exposure  I spoke with the patient to conduct this consult visit via telephone to spare the patient unnecessary potential exposure in the healthcare setting during the current COVID-19 pandemic. The patient was notified in advance and was offered a [facility] meeting to allow for face to face communication but unfortunately reported that they did not have the appropriate resources/technology to support such a visit and instead preferred to proceed with a telephone consult.     Name: Emily Montoya        MRN: [id number]  Date of Service: 08/29/2021 DOB: 1971-09-10  WF:UXNA, Emily Flattery, MD  Emily Kussmaul, MD     REFERRING PHYSICIAN: [name], MD   DIAGNOSIS: The encounter diagnosis was Malignant neoplasm of upper-inner quadrant of right breast in female, estrogen receptor positive ([place]).   HISTORY OF PRESENT ILLNESS: Emily Montoya is a 50 y.o. female seen at the request of Dr. Marlou Montoya for a new diagnosis of right breast cancer. The patient was noted to have a   palpable mass of the right diagnostic imaging identified a mass in the 1 o'clock position of the right breast measuring up to 1.2 cm in greatest dimension, no evidence of axillary adenopathy was noted.  She underwent a biopsy on 08/20/2021 that showed a grade 2 invasive ductal carcinoma that was ER/PR positive, HER2 negative with a Ki 67 of 15%. She met with Dr. Marlou Montoya and has been offered breast conservation.  She was also considering mastectomy.  She is still considering her options and is seen today to discuss adjuvant radiotherapy.  She is to meet with Dr. Burr Montoya tomorrow.    PREVIOUS RADIATION THERAPY: No   PAST MEDICAL HISTORY:  Past Medical History:  Diagnosis Date   Breast cancer ([place]) 08/20/2021       PAST SURGICAL HISTORY: Past  Surgical History:  Procedure Laterality Date   TUBAL LIGATION       FAMILY HISTORY: History reviewed. No pertinent family history.   SOCIAL HISTORY:  reports that she quit smoking 7 days ago. Her smoking use included cigarettes. She has never used smokeless tobacco. She reports current alcohol use. She reports that she does not use drugs. The patient is single  and lives in [place]. She works for [employer] remotely from home. She has three children ages 19-32.    ALLERGIES: Meloxicam   MEDICATIONS:  Current Outpatient Medications  Medication Sig Dispense Refill   clotrimazole (LOTRIMIN) 1 % cream Apply to affected area 2 times daily (Patient not taking: No sig reported) 15 g 0   HYDROcodone-acetaminophen (NORCO/VICODIN) 5-325 MG tablet Take 2 tablets by mouth every 6 (six) hours as needed. (Patient not taking: Reported on 08/29/2021) 12 tablet 0   No current facility-administered medications for this encounter.     REVIEW OF SYSTEMS: On review of systems, the patient reports that she is doing okay in processing her new diagnosis. No breast specific complaints are noted.     PHYSICAL EXAM:  Unable to assess due to encounter type    ECOG = 0  0 - Asymptomatic (Fully active, able to carry on all predisease activities without restriction)  1 - Symptomatic but completely ambulatory (Restricted in physically strenuous activity but ambulatory and able to carry out work of a light or sedentary nature. For example, light housework, office  work)  2 - Symptomatic, <50% in bed during the day (Ambulatory and capable of all self care but unable to carry out any work activities. Up and about more than 50% of waking hours)  3 - Symptomatic, >50% in bed, but not bedbound (Capable of only limited self-care, confined to bed or chair 50% or more of waking hours)  4 - Bedbound (Completely disabled. Cannot carry on any self-care. Totally confined to bed or chair)  5 -  Death   Eustace Pen MM, Creech RH, Tormey DC, et al. [phone]). "Toxicity and response criteria of the [facility] Group". [place] Oncol. 5 (6): 649-55    LABORATORY DATA:  Lab Results  Component Value Date   WBC 9.2 02/27/2021   HGB 11.4 (L) 02/27/2021   HCT 36.1 02/27/2021   MCV 86.2 02/27/2021   PLT 319 02/27/2021   Lab Results  Component Value Date   NA 139 02/27/2021   K 3.6 02/27/2021   CL 110 02/27/2021   CO2 23 02/27/2021   Lab Results  Component Value Date   ALT 15 06/14/2019   AST 22 06/14/2019   ALKPHOS 53 06/14/2019   BILITOT 0.6 06/14/2019      RADIOGRAPHY: US BREAST LTD UNI RIGHT INC AXILLA  Result Date: 08/14/2021 CLINICAL DATA:  50 year old female with a palpable right breast lump for approximately 6 months. EXAM: DIGITAL DIAGNOSTIC BILATERAL MAMMOGRAM WITH TOMOSYNTHESIS AND CAD; ULTRASOUND RIGHT BREAST LIMITED TECHNIQUE: Bilateral digital diagnostic mammography and breast tomosynthesis was performed. The images were evaluated with computer-aided detection.; Targeted ultrasound examination of the right breast was performed COMPARISON:  None. ACR Breast Density Category c: The breast tissue is heterogeneously dense, which may obscure small masses. FINDINGS: A radiopaque BB was placed at the site of the patient's palpable lump in the far posterior upper inner right breast. There is a partially visualized spiculated, hyperdense mass deep to the radiopaque BB. Otherwise no suspicious findings in the remainder of either breast. Targeted ultrasound is performed, showing an irregular, hypoechoic mass at the 1 o'clock position 15 cm from the nipple on the right. It measures 1.1 x 1.3 x 0.9 cm. There is associated vascularity. Evaluation of the right axilla demonstrates no suspicious lymphadenopathy. IMPRESSION: 1. Suspicious right breast mass. Recommendation is for ultrasound-guided biopsy. 2. No suspicious right axillary lymphadenopathy. 3. No mammographic  evidence of malignancy on the left. RECOMMENDATION: Ultrasound-guided biopsy of the right breast. I have discussed the findings and recommendations with the patient. If applicable, a reminder letter will be sent to the patient regarding the next appointment. BI-RADS CATEGORY  4: Suspicious. Electronically Signed   By: Kristopher Oppenheim M.D.   On: 08/14/2021 14:23  MM DIAG BREAST TOMO BILATERAL  Result Date: 08/14/2021 CLINICAL DATA:  50 year old female with a palpable right breast lump for approximately 6 months. EXAM: DIGITAL DIAGNOSTIC BILATERAL MAMMOGRAM WITH TOMOSYNTHESIS AND CAD; ULTRASOUND RIGHT BREAST LIMITED TECHNIQUE: Bilateral digital diagnostic mammography and breast tomosynthesis was performed. The images were evaluated with computer-aided detection.; Targeted ultrasound examination of the right breast was performed COMPARISON:  None. ACR Breast Density Category c: The breast tissue is heterogeneously dense, which may obscure small masses. FINDINGS: A radiopaque BB was placed at the site of the patient's palpable lump in the far posterior upper inner right breast. There is a partially visualized spiculated, hyperdense mass deep to the radiopaque BB. Otherwise no suspicious findings in the remainder of either breast. Targeted ultrasound is performed, showing an irregular, hypoechoic mass at the  1 o'clock position 15 cm from the nipple on the right. It measures 1.1 x 1.3 x 0.9 cm. There is associated vascularity. Evaluation of the right axilla demonstrates no suspicious lymphadenopathy. IMPRESSION: 1. Suspicious right breast mass. Recommendation is for ultrasound-guided biopsy. 2. No suspicious right axillary lymphadenopathy. 3. No mammographic evidence of malignancy on the left. RECOMMENDATION: Ultrasound-guided biopsy of the right breast. I have discussed the findings and recommendations with the patient. If applicable, a reminder letter will be sent to the patient regarding the next appointment.  BI-RADS CATEGORY  4: Suspicious. Electronically Signed   By: Kristopher Oppenheim M.D.   On: 08/14/2021 14:23  MM CLIP PLACEMENT RIGHT  Result Date: 08/20/2021 CLINICAL DATA:  Post biopsy mammogram of the right breast for clip placement. EXAM: 3D DIAGNOSTIC RIGHT MAMMOGRAM POST ULTRASOUND BIOPSY COMPARISON:  Previous exam(s). FINDINGS: 3D Mammographic images were obtained following ultrasound guided biopsy of a right breast mass at 1 o'clock. The biopsy marking clip is in expected position at the site of biopsy. Due to the far superior posterior location of the mass, it can only be seen on the MLO view. IMPRESSION: Appropriate positioning of the ribbon shaped biopsy marking clip at the site of biopsy in the upper inner right breast. Final Assessment: Post Procedure Mammograms for Marker Placement Electronically Signed   By: Ammie Ferrier M.D.   On: 08/20/2021 13:51  [name] RT BREAST BX W LOC DEV 1ST LESION IMG BX SPEC US GUIDE  Addendum Date: 08/22/2021   ADDENDUM REPORT: 08/22/2021 11:14 ADDENDUM: Pathology revealed GRADE II INVASIVE DUCTAL CARCINOMA of the RIGHT breast, 1:00 o'clock, 15cmfn, ribbon clip. This was found to be concordant by Dr. Ammie Ferrier. Pathology results were discussed with the patient by telephone. The patient reported doing well after the biopsy with tenderness at the site. Post biopsy instructions and care were reviewed and questions were answered. The patient was encouraged to call [facility] for any additional concerns. Surgical consultation has been arranged with Dr. Autumn Messing at [facility] on August 27, 2021. Pathology results reported by Stacie Acres RN on 08/22/2021. Electronically Signed   By: Ammie Ferrier M.D.   On: 08/22/2021 11:14   Result Date: 08/22/2021 CLINICAL DATA:  50 year old female presenting for ultrasound-guided biopsy of a right breast mass. EXAM: ULTRASOUND GUIDED RIGHT BREAST CORE NEEDLE BIOPSY COMPARISON:   Previous exam(s). PROCEDURE: I met with the patient and we discussed the procedure of ultrasound-guided biopsy, including benefits and alternatives. We discussed the high likelihood of a successful procedure. We discussed the risks of the procedure, including infection, bleeding, tissue injury, clip migration, and inadequate sampling. Informed written consent was given. The usual time-out protocol was performed immediately prior to the procedure. Lesion quadrant: Upper inner quadrant Using sterile technique and 1% Lidocaine as local anesthetic, under direct ultrasound visualization, a 14 gauge spring-loaded device was used to perform biopsy of a mass in the right breast at 1 o'clock, 15 cm from the nipple using an inferior approach. At the conclusion of the procedure a ribbon shaped tissue marker clip was deployed into the biopsy cavity. Follow up 2 view mammogram was performed and dictated separately. IMPRESSION: Ultrasound guided biopsy of a right breast mass at 1 o'clock. No apparent complications. Electronically Signed: By: Ammie Ferrier M.D. On: 08/20/2021 13:30      IMPRESSION/PLAN: 1. Stage IA, [id number] grade 2, ER/PR positive invasive ductal carcinoma of the right breast. Dr. Lisbeth Renshaw discusses the pathology findings and reviews the  nature of early stage breast disease.  She is a good candidate for breast conservation with lumpectomy with sentinel node biopsy. She is also considering her options for mastectomy but is leaning more toward lumpectomy. Depending on the size of the final tumor measurements rendered by pathology, the tumor may be tested for Oncotype Dx score to determine a role for systemic therapy. Provided that chemotherapy is not indicated, the patient's course would then be followed by external radiotherapy to the breast  to reduce risks of local recurrence followed by antiestrogen therapy. We discussed the risks, benefits, short, and long term effects of radiotherapy, as well as the  curative intent, and the patient is interested in proceeding. Dr. Lisbeth Renshaw discusses the delivery and logistics of radiotherapy and anticipates a course of 6 1/2 weeks of radiotherapy to the right breast. We will see her back a few weeks after surgery to discuss the simulation process and anticipate we starting radiotherapy about 4-6 weeks after surgery.  2. Contraceptive Counseling. The patient is having cycles but has had a tubal ligation after her last pregnancy 19 years ago. No pregnancy testing is needed prior to proceeding.   3. Possible genetic predisposition to malignancy. The patient is a candidate for genetic testing given her personal history. She was offered referral and she will be referred urgently as her genetic testing may help determine her surgical decision making.   Given current concerns for patient exposure during the COVID-19 pandemic, this encounter was conducted via telephone.  The patient has provided two factor identification and has given verbal consent for this type of encounter and has been advised to only accept a meeting of this type in a secure network environment. The time spent during this encounter was 60 minutes including preparation, discussion, and coordination of the patient's care. The attendants for this meeting include Blenda Nicely, RN, Dr. Lisbeth Renshaw, Hayden Pedro  and Altamese Iago.  During the encounter,  Blenda Nicely, RN, Dr. Lisbeth Renshaw, and Hayden Pedro were located at [facility] Radiation Oncology Department.  Emily Montoya was located at home.    The above documentation reflects my direct findings during this shared patient visit. Please see the separate note by Dr. Lisbeth Renshaw on this date for the remainder of the patient's plan of care.    Carola Rhine, [facility]    **Disclaimer: This note was dictated with voice recognition software. Similar sounding words can inadvertently be transcribed and this note may contain  transcription errors which may not have been corrected upon publication of note.**

## 2021-08-30 ENCOUNTER — Ambulatory Visit: Payer: 59 | Admitting: Hematology

## 2021-08-30 ENCOUNTER — Telehealth: Payer: Self-pay

## 2021-08-30 NOTE — Telephone Encounter (Signed)
Spoke w/pt regarding appt scheduled for today.  Dr. Burr Medico would like to reschedule the pt's appt for today at 0900 or Monday at 1540.  Dr Burr Medico can also see pt on Tuesday at 1200.  Pt stated she could not come today at 0900 and would prefer to come in on Monday @1540 .  Rescheduled pt's appt to Monday.

## 2021-09-02 ENCOUNTER — Inpatient Hospital Stay: Payer: 59 | Attending: Hematology | Admitting: Hematology

## 2021-09-02 ENCOUNTER — Encounter: Payer: Self-pay | Admitting: Hematology

## 2021-09-02 ENCOUNTER — Other Ambulatory Visit: Payer: Self-pay

## 2021-09-02 VITALS — BP 117/71 | HR 63 | Temp 99.4°F | Resp 17 | Ht 62.5 in | Wt 162.3 lb

## 2021-09-02 DIAGNOSIS — M549 Dorsalgia, unspecified: Secondary | ICD-10-CM | POA: Insufficient documentation

## 2021-09-02 DIAGNOSIS — Z170 Estrogen receptor positive status [ER+]: Secondary | ICD-10-CM | POA: Diagnosis not present

## 2021-09-02 DIAGNOSIS — Z87891 Personal history of nicotine dependence: Secondary | ICD-10-CM | POA: Insufficient documentation

## 2021-09-02 DIAGNOSIS — Z79899 Other long term (current) drug therapy: Secondary | ICD-10-CM | POA: Diagnosis not present

## 2021-09-02 DIAGNOSIS — C50211 Malignant neoplasm of upper-inner quadrant of right female breast: Secondary | ICD-10-CM | POA: Diagnosis present

## 2021-09-02 NOTE — Progress Notes (Addendum)
Emily Montoya   [phone] Fax:([phone]) [place] Note   Patient Care Team: Christophe Louis, MD as PCP - General (Obstetrics and Gynecology)  Date of Service:  09/02/2021   CHIEF COMPLAINTS/PURPOSE OF CONSULTATION:  Right Breast Cancer, ER+  REFERRING PHYSICIAN:  Dr. Marlou Starks   ASSESSMENT & PLAN:  Emily Montoya is a 50 y.o. premenopausal female with   1. Malignant neoplasm of upper-inner quadrant of right breast, Stage IA, c(T1c, N0), ER+/PR+/HER2-, Grade 2  -presented with palpable right breast lump x6 months. B/l diagnostic MM and right breast US 08/14/21 showed 1.3 cm right breast mass at 1 o'clock. No lymphadenopathy or left breast malignancy. Biopsy 08/20/21 showed IDC, grade 2, Ki67 of 15%. --We discussed her imaging findings and the biopsy results in great details. -Given the low grade disease, she likely need a lumpectomy. She is agreeable with that. She was seen by Dr. Marlou Starks on 08/27/21 and likely will proceed with surgery soon.  -I recommend a Oncotype Dx test on the surgical sample and we'll make a decision about adjuvant chemotherapy based on the Oncotype result. Written material of this test was given to her. She is young and fit, would be a good candidate for chemotherapy if her Oncotype recurrence score is high. -If she has positive node, will do Mammaprint  -Giving the strong ER and PR expression in her premenopausal status, I recommend adjuvant endocrine therapy with Tamoxifen for a total of 10 years to reduce the risk of cancer recurrence. Potential benefits and side effects were discussed with patient in detail today and she is interested. -We also discussed the benefit of ovarian suppression and aromatase inhibitor after surgery, especially if she has high risk disease.  Potential side effects were reviewed with her today. -She was also seen by radiation oncologist Dr. Lisbeth Renshaw on 08/29/21. She will likely benefit from breast radiation  if she undergo lumpectomy to decrease the risk of breast cancer.  -We also discussed the breast cancer surveillance after her surgery. She will continue annual screening mammogram, self exam, and a routine office visit with lab and exam with [name]. -I encouraged her to have healthy diet and exercise regularly.   2. Genetic testing -given her young age, she qualifies for genetic testing. She is scheduled for counseling on 09/05/21.  3. Right hip/Back pain -she has had pain since 02/2021. She has undergone CT's and x-rays with no explanation. -I discussed that breast cancer does spread to bones. I will order MRI to rule out metastatic disease. -If negative, I will refer her to orthopedics for further work up.   PLAN:  -proceed with lumpectomy and SLN biopsy per Dr. Marlou Starks soon, to be scheduled -will order Oncotype Dx on her surgical sample -I ordered lumbar MRI to be done soon due to her back pain. Will call her with results  -I will see her 2-3 weeks after surgery to discuss her pathology and oncotype results if needed, or after her RT  -will check lab CBC, CMP and iron study when she has lab draw for genetics    Oncology History  Malignant neoplasm of upper-inner quadrant of right breast in female, estrogen receptor positive ([place])  08/14/2021 Mammogram   EXAM: DIGITAL DIAGNOSTIC BILATERAL MAMMOGRAM WITH TOMOSYNTHESIS AND CAD; ULTRASOUND RIGHT BREAST LIMITED  IMPRESSION: 1. Suspicious right breast mass at the 1 o'clock position 15 cm from the nipple on the right. It measures 1.1 x 1.3 x 0.9 cm. Recommendation is for ultrasound-guided  biopsy. 2. No suspicious right axillary lymphadenopathy. 3. No mammographic evidence of malignancy on the left.   08/20/2021 Pathology Results   Diagnosis Breast, right, needle core biopsy, 1 o'clock, 15 cmfn, ribbon clip - INVASIVE DUCTAL CARCINOMA - SEE COMMENT  Microscopic Comment Based on the biopsy, the carcinoma appears Nottingham grade 2 of 3 and  measures 1.2 cm in greatest linear extent.  PROGNOSTIC INDICATORS Results: The tumor cells are NEGATIVE for Her2 (1+). Estrogen Receptor: 90%, POSITIVE, MODERATE STAINNG INTENSITY Progesterone Receptor: 90%, POSITIVE, STRONG STAINING INTENSITY Proliferation Marker Ki67: 15%   08/20/2021 Cancer Staging   Staging form: Breast, AJCC 8th Edition - Clinical stage from 08/20/2021: Stage IA (cT1c, cN0, cM0, G2, ER+, PR+, HER2-) - Signed by Truitt Merle, MD on 09/02/2021 Method of lymph node assessment: Clinical Histologic grading system: 3 grade system    08/29/2021 Initial Diagnosis   Malignant neoplasm of upper-inner quadrant of right breast in female, estrogen receptor positive ([place])      HISTORY OF PRESENTING ILLNESS:  Emily Montoya 50 y.o. female is a here because of breast cancer. The patient was referred by Dr. Marlou Starks. The patient presents to the clinic today accompanied by her sister and her daughter.   She presented with palpable right breast lump. She underwent bilateral diagnostic mammography and right breast ultrasonography on 08/14/21 showing: suspicious 1.3 cm right breast mass at 1 o'clock; no lymphadenopathy or left breast malignancy. She notes this was her first mammogram.  Biopsy on 08/20/21 showed: invasive ductal carcinoma, grade 2. Prognostic indicators significant for: estrogen receptor, 90% positive and progesterone receptor, 90% positive. Proliferation marker Ki67 at 15%. HER2 negative.   Today the patient notes they felt/feeling prior/after... -she initially noticed the right breast lump about 8 months ago (~12/2020). She reports she brought it to her doctor's attention when she developed pain and tenderness to the area. -she reports she still has some post-biopsy bruising and pain. She notes she is still using ice for pain.   She has a PMHx of.... -sickle cell trait -heart murmur -pain in her right hip/low back for 6 months. She reports the pain is constant,  rated 5-6/10. She initially presented to ED 02/27/21 with intense pain. She notes she was told it's arthritis.   Socially... -she lives at home with her daughter, her son, and several grandchildren. -she works from home -no family history of cancer to her knowledge -she began smoking cigarettes around age [age], ~1/3 ppd. She stopped with her cancer diagnosis.   GYN HISTORY  Menarchal: 42-56 years old LMP: having regular, heavy periods* Contraceptive: s/p tubal ligation, discussed IUD with GYN for periods HRT: n/a GP: 3 *she was supposed to undergo hysterectomy, but this has been postponed due to cancer diagnosis.   REVIEW OF SYSTEMS:    Constitutional: Denies fevers, chills or abnormal night sweats Eyes: Denies blurriness of vision, double vision or watery eyes Ears, nose, mouth, throat, and face: Denies mucositis or sore throat Respiratory: Denies cough, dyspnea or wheezes Cardiovascular: Denies palpitation, chest discomfort or lower extremity swelling Gastrointestinal:  Denies nausea, heartburn or change in bowel habits Skin: Denies abnormal skin rashes Lymphatics: Denies new lymphadenopathy or easy bruising Neurological:Denies numbness, tingling or new weaknesses Behavioral/Psych: Mood is stable, no new changes  All other systems were reviewed with the patient and are negative.   MEDICAL HISTORY:  Past Medical History:  Diagnosis Date   Breast cancer ([place]) 08/20/2021   Sickle cell trait ([place])     SURGICAL HISTORY: Past  Surgical History:  Procedure Laterality Date   TUBAL LIGATION      SOCIAL HISTORY: Social History   Socioeconomic History   Marital status: Legally Separated    Spouse name: Not on file   Number of children: 3   Years of education: Not on file   Highest education level: Not on file  Occupational History   Not on file  Tobacco Use   Smoking status: Former    Packs/day: 0.25    Years: 30.00    Pack years: 7.50    Types: Cigarettes    Quit  date: 08/22/2021    Years since quitting: 0.0   Smokeless tobacco: Never  Substance and Sexual Activity   Alcohol use: Yes    Comment: Occasional Beers   Drug use: Never   Sexual activity: Not on file    Comment: s/p tubal ligation  Other Topics Concern   Not on file  Social History Narrative   Not on file   Social Determinants of Health   Financial Resource Strain: Not on file  Food Insecurity: Not on file  Transportation Needs: Not on file  Physical Activity: Not on file  Stress: Not on file  Social Connections: Not on file  Intimate Partner Violence: Not At Risk   Fear of Current or Ex-Partner: No   Emotionally Abused: No   Physically Abused: No   Sexually Abused: No    FAMILY HISTORY: History reviewed. No pertinent family history.  ALLERGIES:  is allergic to meloxicam.  MEDICATIONS:  Current Outpatient Medications  Medication Sig Dispense Refill   clotrimazole (LOTRIMIN) 1 % cream Apply to affected area 2 times daily (Patient not taking: No sig reported) 15 g 0   No current facility-administered medications for this visit.    PHYSICAL EXAMINATION: ECOG PERFORMANCE STATUS: 0 - Asymptomatic  Vitals:   09/02/21 1559  BP: 117/71  Pulse: 63  Resp: 17  Temp: 99.4 F (37.4 C)  SpO2: 100%   Filed Weights   09/02/21 1559  Weight: 162 lb 4.8 oz (73.6 kg)    GENERAL:alert, no distress and comfortable SKIN: skin color, texture, turgor are normal, no rashes or significant lesions EYES: normal, Conjunctiva are pink and non-injected, sclera clear  NECK: supple, thyroid normal size, non-tender, without nodularity LYMPH:  no palpable lymphadenopathy in the cervical, axillary  LUNGS: clear to auscultation and percussion with normal breathing effort HEART: regular rate & rhythm and no murmurs and no lower extremity edema ABDOMEN:abdomen soft, non-tender and normal bowel sounds Musculoskeletal:no cyanosis of digits and no clubbing  NEURO: alert & oriented x 3 with  fluent speech, no focal motor/sensory deficits BREAST: 1 cm lump in right breast. No palpable adenopathy bilaterally.  LABORATORY DATA:  I have reviewed the data as listed CBC Latest Ref Rng & Units 02/27/2021 06/14/2019  WBC 4.0 - 10.5 K/uL 9.2 8.9  Hemoglobin 12.0 - 15.0 g/dL 11.4(L) 14.0  Hematocrit 36.0 - 46.0 % 36.1 43.3  Platelets 150 - 400 K/uL 319 308    CMP Latest Ref Rng & Units 02/27/2021 06/14/2019  Glucose 70 - 99 mg/dL 88 92  BUN 6 - 20 mg/dL 10 10  Creatinine 0.44 - 1.00 mg/dL 0.82 0.72  Sodium 135 - 145 mmol/L 139 140  Potassium 3.5 - 5.1 mmol/L 3.6 3.3(L)  Chloride 98 - 111 mmol/L 110 108  CO2 22 - 32 mmol/L 23 20(L)  Calcium 8.9 - 10.3 mg/dL 8.6(L) 8.8(L)  Total Protein 6.5 - 8.1 g/dL - 6.8  Total Bilirubin 0.3 - 1.2 mg/dL - 0.6  Alkaline Phos 38 - 126 U/L - 53  AST 15 - 41 U/L - 22  ALT 0 - 44 U/L - 15     RADIOGRAPHIC STUDIES: I have personally reviewed the radiological images as listed and agreed with the findings in the report. US BREAST LTD UNI RIGHT INC AXILLA  Result Date: 08/14/2021 CLINICAL DATA:  50 year old female with a palpable right breast lump for approximately 6 months. EXAM: DIGITAL DIAGNOSTIC BILATERAL MAMMOGRAM WITH TOMOSYNTHESIS AND CAD; ULTRASOUND RIGHT BREAST LIMITED TECHNIQUE: Bilateral digital diagnostic mammography and breast tomosynthesis was performed. The images were evaluated with computer-aided detection.; Targeted ultrasound examination of the right breast was performed COMPARISON:  None. ACR Breast Density Category c: The breast tissue is heterogeneously dense, which may obscure small masses. FINDINGS: A radiopaque BB was placed at the site of the patient's palpable lump in the far posterior upper inner right breast. There is a partially visualized spiculated, hyperdense mass deep to the radiopaque BB. Otherwise no suspicious findings in the remainder of either breast. Targeted ultrasound is performed, showing an irregular, hypoechoic mass  at the 1 o'clock position 15 cm from the nipple on the right. It measures 1.1 x 1.3 x 0.9 cm. There is associated vascularity. Evaluation of the right axilla demonstrates no suspicious lymphadenopathy. IMPRESSION: 1. Suspicious right breast mass. Recommendation is for ultrasound-guided biopsy. 2. No suspicious right axillary lymphadenopathy. 3. No mammographic evidence of malignancy on the left. RECOMMENDATION: Ultrasound-guided biopsy of the right breast. I have discussed the findings and recommendations with the patient. If applicable, a reminder letter will be sent to the patient regarding the next appointment. BI-RADS CATEGORY  4: Suspicious. Electronically Signed   By: Kristopher Oppenheim M.D.   On: 08/14/2021 14:23  MM DIAG BREAST TOMO BILATERAL  Result Date: 08/14/2021 CLINICAL DATA:  50 year old female with a palpable right breast lump for approximately 6 months. EXAM: DIGITAL DIAGNOSTIC BILATERAL MAMMOGRAM WITH TOMOSYNTHESIS AND CAD; ULTRASOUND RIGHT BREAST LIMITED TECHNIQUE: Bilateral digital diagnostic mammography and breast tomosynthesis was performed. The images were evaluated with computer-aided detection.; Targeted ultrasound examination of the right breast was performed COMPARISON:  None. ACR Breast Density Category c: The breast tissue is heterogeneously dense, which may obscure small masses. FINDINGS: A radiopaque BB was placed at the site of the patient's palpable lump in the far posterior upper inner right breast. There is a partially visualized spiculated, hyperdense mass deep to the radiopaque BB. Otherwise no suspicious findings in the remainder of either breast. Targeted ultrasound is performed, showing an irregular, hypoechoic mass at the 1 o'clock position 15 cm from the nipple on the right. It measures 1.1 x 1.3 x 0.9 cm. There is associated vascularity. Evaluation of the right axilla demonstrates no suspicious lymphadenopathy. IMPRESSION: 1. Suspicious right breast mass. Recommendation is  for ultrasound-guided biopsy. 2. No suspicious right axillary lymphadenopathy. 3. No mammographic evidence of malignancy on the left. RECOMMENDATION: Ultrasound-guided biopsy of the right breast. I have discussed the findings and recommendations with the patient. If applicable, a reminder letter will be sent to the patient regarding the next appointment. BI-RADS CATEGORY  4: Suspicious. Electronically Signed   By: Kristopher Oppenheim M.D.   On: 08/14/2021 14:23  MM CLIP PLACEMENT RIGHT  Result Date: 08/20/2021 CLINICAL DATA:  Post biopsy mammogram of the right breast for clip placement. EXAM: 3D DIAGNOSTIC RIGHT MAMMOGRAM POST ULTRASOUND BIOPSY COMPARISON:  Previous exam(s). FINDINGS: 3D Mammographic images were obtained following ultrasound guided biopsy of  a right breast mass at 1 o'clock. The biopsy marking clip is in expected position at the site of biopsy. Due to the far superior posterior location of the mass, it can only be seen on the MLO view. IMPRESSION: Appropriate positioning of the ribbon shaped biopsy marking clip at the site of biopsy in the upper inner right breast. Final Assessment: Post Procedure Mammograms for Marker Placement Electronically Signed   By: Ammie Ferrier M.D.   On: 08/20/2021 13:51  [name] RT BREAST BX W LOC DEV 1ST LESION IMG BX SPEC US GUIDE  Addendum Date: 08/22/2021   ADDENDUM REPORT: 08/22/2021 11:14 ADDENDUM: Pathology revealed GRADE II INVASIVE DUCTAL CARCINOMA of the RIGHT breast, 1:00 o'clock, 15cmfn, ribbon clip. This was found to be concordant by Dr. Ammie Ferrier. Pathology results were discussed with the patient by telephone. The patient reported doing well after the biopsy with tenderness at the site. Post biopsy instructions and care were reviewed and questions were answered. The patient was encouraged to call [facility] for any additional concerns. Surgical consultation has been arranged with Dr. Autumn Messing at [facility] on August 27, 2021. Pathology results reported by Stacie Acres RN on 08/22/2021. Electronically Signed   By: Ammie Ferrier M.D.   On: 08/22/2021 11:14   Result Date: 08/22/2021 CLINICAL DATA:  50 year old female presenting for ultrasound-guided biopsy of a right breast mass. EXAM: ULTRASOUND GUIDED RIGHT BREAST CORE NEEDLE BIOPSY COMPARISON:  Previous exam(s). PROCEDURE: I met with the patient and we discussed the procedure of ultrasound-guided biopsy, including benefits and alternatives. We discussed the high likelihood of a successful procedure. We discussed the risks of the procedure, including infection, bleeding, tissue injury, clip migration, and inadequate sampling. Informed written consent was given. The usual time-out protocol was performed immediately prior to the procedure. Lesion quadrant: Upper inner quadrant Using sterile technique and 1% Lidocaine as local anesthetic, under direct ultrasound visualization, a 14 gauge spring-loaded device was used to perform biopsy of a mass in the right breast at 1 o'clock, 15 cm from the nipple using an inferior approach. At the conclusion of the procedure a ribbon shaped tissue marker clip was deployed into the biopsy cavity. Follow up 2 view mammogram was performed and dictated separately. IMPRESSION: Ultrasound guided biopsy of a right breast mass at 1 o'clock. No apparent complications. Electronically Signed: By: Ammie Ferrier M.D. On: 08/20/2021 13:30    Orders Placed This Encounter  Procedures   MR Lumbar Spine W Wo Contrast    Standing Status:   Future    Standing Expiration Date:   09/02/2022    Order Specific Question:   If indicated for the ordered procedure, I authorize the administration of contrast media per Radiology protocol    Answer:   Yes    Order Specific Question:   What is the patient's sedation requirement?    Answer:   No Sedation    Order Specific Question:   Does the patient have a pacemaker or implanted devices?     Answer:   No    Order Specific Question:   Use SRS Protocol?    Answer:   No    Order Specific Question:   Preferred imaging location?    Answer:   [facility] (table limit - 550 lbs)     All questions were answered. The patient knows to call the clinic with any problems, questions or concerns. The total time spent in the appointment was 47  minutes.     Truitt Merle, MD 09/02/2021 11:15 PM  I, Wilburn Mylar, am acting as scribe for Truitt Merle, MD.   I have reviewed the above documentation for accuracy and completeness, and I agree with the above.

## 2021-09-03 ENCOUNTER — Encounter: Payer: Self-pay | Admitting: *Deleted

## 2021-09-04 ENCOUNTER — Telehealth: Payer: Self-pay | Admitting: *Deleted

## 2021-09-04 NOTE — Telephone Encounter (Signed)
Spoke to pt regarding dx and treatment care plan. Denies questions or concerns.  Pt did ask about starting Tamoxifen prior to sx d/t heavy menses and cramps. Dr. Burr Medico notified. Provided navigation resources and contact information.

## 2021-09-05 ENCOUNTER — Other Ambulatory Visit: Payer: Self-pay

## 2021-09-05 ENCOUNTER — Encounter: Payer: Self-pay | Admitting: Genetic Counselor

## 2021-09-05 ENCOUNTER — Inpatient Hospital Stay (HOSPITAL_BASED_OUTPATIENT_CLINIC_OR_DEPARTMENT_OTHER): Payer: 59 | Admitting: Genetic Counselor

## 2021-09-05 ENCOUNTER — Other Ambulatory Visit: Payer: Self-pay | Admitting: Genetic Counselor

## 2021-09-05 ENCOUNTER — Inpatient Hospital Stay: Payer: 59

## 2021-09-05 DIAGNOSIS — C50211 Malignant neoplasm of upper-inner quadrant of right female breast: Secondary | ICD-10-CM | POA: Diagnosis not present

## 2021-09-05 DIAGNOSIS — Z170 Estrogen receptor positive status [ER+]: Secondary | ICD-10-CM

## 2021-09-05 LAB — GENETIC SCREENING ORDER

## 2021-09-05 NOTE — Progress Notes (Signed)
REFERRING PROVIDER: Truitt Merle, MD [address] [place],  [place] [address]  PRIMARY PROVIDER:  Christophe Louis, MD  PRIMARY REASON FOR VISIT:  1. Malignant neoplasm of upper-inner quadrant of right breast in female, estrogen receptor positive ([place])      HISTORY OF PRESENT ILLNESS:   Emily Montoya, a 50 y.o. female, was seen for a [place] cancer genetics consultation at the request of Dr. Burr Medico due to a personal history of breast cancer.  Emily Montoya presents to clinic today to discuss the possibility of a hereditary predisposition to cancer, genetic testing, and to further clarify her future cancer risks, as well as potential cancer risks for family members.   In October 2022, at the age of 72, Emily Montoya was diagnosed with ductal carcinoma of the right breast. The treatment plan includes lumpectomy, radiation and tamoxifen.     CANCER HISTORY:  Oncology History  Malignant neoplasm of upper-inner quadrant of right breast in female, estrogen receptor positive ([place])  08/14/2021 Mammogram   EXAM: DIGITAL DIAGNOSTIC BILATERAL MAMMOGRAM WITH TOMOSYNTHESIS AND CAD; ULTRASOUND RIGHT BREAST LIMITED  IMPRESSION: 1. Suspicious right breast mass at the 1 o'clock position 15 cm from the nipple on the right. It measures 1.1 x 1.3 x 0.9 cm. Recommendation is for ultrasound-guided biopsy. 2. No suspicious right axillary lymphadenopathy. 3. No mammographic evidence of malignancy on the left.   08/20/2021 Pathology Results   Diagnosis Breast, right, needle core biopsy, 1 o'clock, 15 cmfn, ribbon clip - INVASIVE DUCTAL CARCINOMA - SEE COMMENT  Microscopic Comment Based on the biopsy, the carcinoma appears Nottingham grade 2 of 3 and measures 1.2 cm in greatest linear extent.  PROGNOSTIC INDICATORS Results: The tumor cells are NEGATIVE for Her2 (1+). Estrogen Receptor: 90%, POSITIVE, MODERATE STAINNG INTENSITY Progesterone Receptor: 90%, POSITIVE, STRONG STAINING  INTENSITY Proliferation Marker Ki67: 15%   08/20/2021 Cancer Staging   Staging form: Breast, AJCC 8th Edition - Clinical stage from 08/20/2021: Stage IA (cT1c, cN0, cM0, G2, ER+, PR+, HER2-) - Signed by Truitt Merle, MD on 09/02/2021 Method of lymph node assessment: Clinical Histologic grading system: 3 grade system    08/29/2021 Initial Diagnosis   Malignant neoplasm of upper-inner quadrant of right breast in female, estrogen receptor positive ([place])      RISK FACTORS:  Menarche was at age 50.  First live birth at age 47.  OCP use for approximately  <1  years.  Ovaries intact: yes.  Hysterectomy: no.  Menopausal status: premenopausal.  HRT use: 0 years. Colonoscopy: no; not examined. Mammogram within the last year: yes. Number of breast biopsies: 1. Up to date with pelvic exams: yes. Any excessive radiation exposure in the past: no  Past Medical History:  Diagnosis Date   Breast cancer ([place]) 08/20/2021   Sickle cell trait ([place])     Past Surgical History:  Procedure Laterality Date   TUBAL LIGATION      Social History   Socioeconomic History   Marital status: Legally Separated    Spouse name: Not on file   Number of children: 3   Years of education: Not on file   Highest education level: Not on file  Occupational History   Not on file  Tobacco Use   Smoking status: Former    Packs/day: 0.25    Years: 30.00    Pack years: 7.50    Types: Cigarettes    Quit date: 08/22/2021    Years since quitting: 0.0   Smokeless tobacco: Never  Substance and Sexual  Activity   Alcohol use: Yes    Comment: Occasional Beers   Drug use: Never   Sexual activity: Not on file    Comment: s/p tubal ligation  Other Topics Concern   Not on file  Social History Narrative   Not on file   Social Determinants of Health   Financial Resource Strain: Not on file  Food Insecurity: Not on file  Transportation Needs: Not on file  Physical Activity: Not on file  Stress: Not on file   Social Connections: Not on file     FAMILY HISTORY:  We obtained a detailed, 4-generation family history.  Significant diagnoses are listed below: Family History  Problem Relation Age of Onset   HIV/AIDS Mother      The patient has two boys and a girl who are cancer free.  She has four maternal half brothers and a maternal half sister who are cancer free.  Both parents are deceased.  The patient does not have any information on her paternal side of the family.  The patient's mother died of AIDS at 5.  She has three brothers and two sisters who are cancer free.  Her parents are deceased.  Ms. Salatino is unaware of previous family history of genetic testing for hereditary cancer risks. Patient's maternal ancestors are of African American descent, and paternal ancestors are of African American descent. There is no reported Ashkenazi Jewish ancestry. There is no known consanguinity.  GENETIC COUNSELING ASSESSMENT: Emily Montoya is a 50 y.o. female with a personal history of breast cancer which is somewhat suggestive of a hereditary cancer syndrome and predisposition to cancer given her age of onset and the limited paternal history. We, therefore, discussed and recommended the following at today's visit.   DISCUSSION: We discussed that, in general, most cancer is not inherited in families, but instead is sporadic or familial. Sporadic cancers occur by chance and typically happen at older ages (>50 years) as this type of cancer is caused by genetic changes acquired during an individual's lifetime. Some families have more cancers than would be expected by chance; however, the ages or types of cancer are not consistent with a known genetic mutation or known genetic mutations have been ruled out. This type of familial cancer is thought to be due to a combination of multiple genetic, environmental, hormonal, and lifestyle factors. While this combination of factors likely increases the risk of cancer, the  exact source of this risk is not currently identifiable or testable.  We discussed that 5 - 10% of breast cancer is hereditary, with most cases associated with BRCA mutations.  There are other genes that can be associated with hereditary breast cancer syndromes.  These include ATM, CHEK2 and PALB2.  We discussed that testing is beneficial for several reasons including knowing how to follow individuals after completing their treatment, identifying whether potential treatment options such as PARP inhibitors would be beneficial, and understand if other family members could be at risk for cancer and allow them to undergo genetic testing.   We reviewed the characteristics, features and inheritance patterns of hereditary cancer syndromes. We also discussed genetic testing, including the appropriate family members to test, the process of testing, insurance coverage and turn-around-time for results. We discussed the implications of a negative, positive, carrier and/or variant of uncertain significant result. We recommended Emily Montoya pursue genetic testing for the CancerNext-Expanded+RNAinsight gene panel.   The CancerNext-Expanded gene panel offered by [facility] and includes sequencing and rearrangement analysis for the following  77 genes: AIP, ALK, APC*, ATM*, AXIN2, BAP1, BARD1, BLM, BMPR1A, BRCA1*, BRCA2*, BRIP1*, CDC73, CDH1*, CDK4, CDKN1B, CDKN2A, CHEK2*, CTNNA1, DICER1, FANCC, FH, FLCN, GALNT12, KIF1B, LZTR1, MAX, MEN1, MET, MLH1*, MSH2*, MSH3, MSH6*, MUTYH*, NBN, NF1*, NF2, NTHL1, PALB2*, PHOX2B, PMS2*, POT1, PRKAR1A, PTCH1, PTEN*, RAD51C*, RAD51D*, RB1, RECQL, RET, SDHA, SDHAF2, SDHB, SDHC, SDHD, SMAD4, SMARCA4, SMARCB1, SMARCE1, STK11, SUFU, TMEM127, TP53*, TSC1, TSC2, VHL and XRCC2 (sequencing and deletion/duplication); EGFR, EGLN1, HOXB13, KIT, MITF, PDGFRA, POLD1, and POLE (sequencing only); EPCAM and GREM1 (deletion/duplication only). DNA and RNA analyses performed for * genes.   Based on Ms.  Montoya personal history of cancer, she meets medical criteria for genetic testing. Despite that she meets criteria, she may still have an out of pocket cost. We discussed that if her out of pocket cost for testing is over $100, the laboratory will call and confirm whether she wants to proceed with testing.  If the out of pocket cost of testing is less than $100 she will be billed by the genetic testing laboratory.   PLAN: After considering the risks, benefits, and limitations, Emily Montoya provided informed consent to pursue genetic testing and the blood sample was sent to [facility] for analysis of the CancerNext-Expanded+RNAinsight. Results should be available within approximately 2-3 weeks' time, at which point they will be disclosed by telephone to Emily Montoya, as will any additional recommendations warranted by these results. Emily Montoya will receive a summary of her genetic counseling visit and a copy of her results once available. This information will also be available in Epic.   Lastly, we encouraged Ms. Solan to remain in contact with cancer genetics annually so that we can continuously update the family history and inform her of any changes in cancer genetics and testing that may be of benefit for this family.   Ms. Crotwell questions were answered to her satisfaction today. Our contact information was provided should additional questions or concerns arise. Thank you for the referral and allowing [name] to share in the care of your patient.   Gil Ingwersen P. Florene Glen, [place], [facility] Licensed, Insurance risk surveyor [email or web address] phone: [phone]  The patient was seen for a total of 35 minutes in face-to-face genetic counseling.  The patient brought her sister in law. This patient was discussed with Drs. Magrinat, Lindi Adie and/or Burr Medico who agrees with the above.    _______________________________________________________________________ For Office Staff:  Number of  people involved in session: 2 Was an Intern/ student involved with case: no

## 2021-09-09 ENCOUNTER — Other Ambulatory Visit: Payer: Self-pay | Admitting: *Deleted

## 2021-09-09 ENCOUNTER — Ambulatory Visit (HOSPITAL_COMMUNITY)
Admission: RE | Admit: 2021-09-09 | Discharge: 2021-09-09 | Disposition: A | Discharge status: Home / Self Care | Payer: 59 | Source: Ambulatory Visit | Attending: Hematology | Admitting: Hematology

## 2021-09-09 ENCOUNTER — Telehealth: Payer: Self-pay | Admitting: *Deleted

## 2021-09-09 DIAGNOSIS — C50211 Malignant neoplasm of upper-inner quadrant of right female breast: Secondary | ICD-10-CM | POA: Insufficient documentation

## 2021-09-09 DIAGNOSIS — Z170 Estrogen receptor positive status [ER+]: Secondary | ICD-10-CM | POA: Diagnosis present

## 2021-09-09 MED ORDER — GADOBUTROL 1 MMOL/ML IV SOLN
8.0000 mL | Freq: Once | INTRAVENOUS | Status: AC | PRN
  Administered 2021-09-09: 8 mL via INTRAVENOUS

## 2021-09-09 MED ORDER — TAMOXIFEN CITRATE 20 MG PO TABS: 20.0000 mg | ORAL_TABLET | Freq: Every day | ORAL | 0 refills | Status: DC

## 2021-09-09 NOTE — Telephone Encounter (Signed)
Pt called regarding tamoxifen prior to surgery d/t heavy menses. Pt was scheduled to have a hysterectomy but was cx d/t dx. Per Dr. Burr Medico prescription sent for Tamoxifen. Pt to stop taking 1 wk prior to sx. Called pt to provide instructions. No answer unable to leave vm. Mychart msg sent with instructions.

## 2021-09-11 ENCOUNTER — Ambulatory Visit: Payer: Self-pay | Admitting: General Surgery

## 2021-09-11 ENCOUNTER — Encounter: Payer: Self-pay | Admitting: *Deleted

## 2021-09-11 DIAGNOSIS — Z170 Estrogen receptor positive status [ER+]: Secondary | ICD-10-CM

## 2021-09-11 DIAGNOSIS — C50211 Malignant neoplasm of upper-inner quadrant of right female breast: Secondary | ICD-10-CM

## 2021-09-12 ENCOUNTER — Other Ambulatory Visit: Payer: Self-pay | Admitting: General Surgery

## 2021-09-12 ENCOUNTER — Encounter: Payer: Self-pay | Admitting: *Deleted

## 2021-09-12 DIAGNOSIS — C50211 Malignant neoplasm of upper-inner quadrant of right female breast: Secondary | ICD-10-CM

## 2021-09-16 ENCOUNTER — Telehealth: Payer: Self-pay | Admitting: *Deleted

## 2021-09-16 ENCOUNTER — Ambulatory Visit: Payer: 59 | Admitting: Physical Therapy

## 2021-09-16 NOTE — Telephone Encounter (Signed)
-----   Message from Truitt Merle, MD sent at 09/15/2021 12:13 PM EST ----- Please let pt know her MRI results, it looks good except mild degenerative arthritis. She can f/u with her PCP for her back pain if needed.   Truitt Merle  09/15/2021

## 2021-09-16 NOTE — Telephone Encounter (Signed)
Per Dr.Feng, called pt with message below. Pt verbalized understanding. Advised to call office with concerns if needed.

## 2021-09-16 NOTE — Telephone Encounter (Signed)
Awaiting Leave form from patient.  Connected to request type of leave, start date of two part leave application and form received blank for either FMLA or Non-FMLA.  Provided fax number [phone]) and e-mail [email or web address].  "Leave for all medical appointments I've had and pending surgery.  Dr. Burr Medico said to bring form to her.  Plan to work through 10/09/2021 and do not know how long I'll need to be out of work."

## 2021-09-23 ENCOUNTER — Telehealth: Payer: Self-pay | Admitting: Genetic Counselor

## 2021-09-23 ENCOUNTER — Encounter: Payer: Self-pay | Admitting: Genetic Counselor

## 2021-09-23 ENCOUNTER — Ambulatory Visit: Payer: Self-pay | Admitting: Genetic Counselor

## 2021-09-23 DIAGNOSIS — Z1379 Encounter for other screening for genetic and chromosomal anomalies: Secondary | ICD-10-CM

## 2021-09-23 NOTE — Progress Notes (Signed)
HPI:  Ms. Shannon was previously seen in the [place] clinic due to a personal history of breast cancer and concerns regarding a hereditary predisposition to cancer. Please refer to our prior cancer genetics clinic note for more information regarding our discussion, assessment and recommendations, at the time. Ms. Litaker recent genetic test results were disclosed to her, as were recommendations warranted by these results. These results and recommendations are discussed in more detail below.  CANCER HISTORY:  Oncology History  Malignant neoplasm of upper-inner quadrant of right breast in female, estrogen receptor positive ([place])  08/14/2021 Mammogram   EXAM: DIGITAL DIAGNOSTIC BILATERAL MAMMOGRAM WITH TOMOSYNTHESIS AND CAD; ULTRASOUND RIGHT BREAST LIMITED  IMPRESSION: 1. Suspicious right breast mass at the 1 o'clock position 15 cm from the nipple on the right. It measures 1.1 x 1.3 x 0.9 cm. Recommendation is for ultrasound-guided biopsy. 2. No suspicious right axillary lymphadenopathy. 3. No mammographic evidence of malignancy on the left.   08/20/2021 Pathology Results   Diagnosis Breast, right, needle core biopsy, 1 o'clock, 15 cmfn, ribbon clip - INVASIVE DUCTAL CARCINOMA - SEE COMMENT  Microscopic Comment Based on the biopsy, the carcinoma appears Nottingham grade 2 of 3 and measures 1.2 cm in greatest linear extent.  PROGNOSTIC INDICATORS Results: The tumor cells are NEGATIVE for Her2 (1+). Estrogen Receptor: 90%, POSITIVE, MODERATE STAINNG INTENSITY Progesterone Receptor: 90%, POSITIVE, STRONG STAINING INTENSITY Proliferation Marker Ki67: 15%   08/20/2021 Cancer Staging   Staging form: Breast, AJCC 8th Edition - Clinical stage from 08/20/2021: Stage IA (cT1c, cN0, cM0, G2, ER+, PR+, HER2-) - Signed by Truitt Merle, MD on 09/02/2021 Method of lymph node assessment: Clinical Histologic grading system: 3 grade system    08/29/2021 Initial Diagnosis    Malignant neoplasm of upper-inner quadrant of right breast in female, estrogen receptor positive ([place])   09/22/2021 Genetic Testing   Negative genetic testing on the CancerNext-Expanded+RNAinsight panel.  The report date is September 20, 2021.  The CancerNext-Expanded gene panel offered by [facility] and includes sequencing and rearrangement analysis for the following 77 genes: AIP, ALK, APC*, ATM*, AXIN2, BAP1, BARD1, BLM, BMPR1A, BRCA1*, BRCA2*, BRIP1*, CDC73, CDH1*, CDK4, CDKN1B, CDKN2A, CHEK2*, CTNNA1, DICER1, FANCC, FH, FLCN, GALNT12, KIF1B, LZTR1, MAX, MEN1, MET, MLH1*, MSH2*, MSH3, MSH6*, MUTYH*, NBN, NF1*, NF2, NTHL1, PALB2*, PHOX2B, PMS2*, POT1, PRKAR1A, PTCH1, PTEN*, RAD51C*, RAD51D*, RB1, RECQL, RET, SDHA, SDHAF2, SDHB, SDHC, SDHD, SMAD4, SMARCA4, SMARCB1, SMARCE1, STK11, SUFU, TMEM127, TP53*, TSC1, TSC2, VHL and XRCC2 (sequencing and deletion/duplication); EGFR, EGLN1, HOXB13, KIT, MITF, PDGFRA, POLD1, and POLE (sequencing only); EPCAM and GREM1 (deletion/duplication only). DNA and RNA analyses performed for * genes.     FAMILY HISTORY:  We obtained a detailed, 4-generation family history.  Significant diagnoses are listed below: Family History  Problem Relation Age of Onset   HIV/AIDS Mother       The patient has two boys and a girl who are cancer free.  She has four maternal half brothers and a maternal half sister who are cancer free.  Both parents are deceased.   The patient does not have any information on her paternal side of the family.   The patient's mother died of AIDS at 36.  She has three brothers and two sisters who are cancer free.  Her parents are deceased.   Ms. Bunker is unaware of previous family history of genetic testing for hereditary cancer risks. Patient's maternal ancestors are of African American descent, and paternal ancestors are of African American descent. There is  no reported Ashkenazi Jewish ancestry. There is no known consanguinity.  GENETIC  TEST RESULTS: Genetic testing reported out on September 22, 2021 through the CancerNext-Expanded+RNAinsight cancer panel found no pathogenic mutations. The CancerNext-Expanded gene panel offered by [facility] and includes sequencing and rearrangement analysis for the following 77 genes: AIP, ALK, APC*, ATM*, AXIN2, BAP1, BARD1, BLM, BMPR1A, BRCA1*, BRCA2*, BRIP1*, CDC73, CDH1*, CDK4, CDKN1B, CDKN2A, CHEK2*, CTNNA1, DICER1, FANCC, FH, FLCN, GALNT12, KIF1B, LZTR1, MAX, MEN1, MET, MLH1*, MSH2*, MSH3, MSH6*, MUTYH*, NBN, NF1*, NF2, NTHL1, PALB2*, PHOX2B, PMS2*, POT1, PRKAR1A, PTCH1, PTEN*, RAD51C*, RAD51D*, RB1, RECQL, RET, SDHA, SDHAF2, SDHB, SDHC, SDHD, SMAD4, SMARCA4, SMARCB1, SMARCE1, STK11, SUFU, TMEM127, TP53*, TSC1, TSC2, VHL and XRCC2 (sequencing and deletion/duplication); EGFR, EGLN1, HOXB13, KIT, MITF, PDGFRA, POLD1, and POLE (sequencing only); EPCAM and GREM1 (deletion/duplication only). DNA and RNA analyses performed for * genes. The test report has been scanned into EPIC and is located under the Molecular Pathology section of the Results Review tab.  A portion of the result report is included below for reference.     We discussed with Ms. Tuckerman that because current genetic testing is not perfect, it is possible there may be a gene mutation in one of these genes that current testing cannot detect, but that chance is small.  We also discussed, that there could be another gene that has not yet been discovered, or that we have not yet tested, that is responsible for the cancer diagnoses in the family. It is also possible there is a hereditary cause for the cancer in the family that Ms. Kauffman did not inherit and therefore was not identified in her testing.  Therefore, it is important to remain in touch with cancer genetics in the future so that we can continue to offer Ms. Mcferran the most up to date genetic testing.   ADDITIONAL GENETIC TESTING: We discussed with Ms. Spragg that her genetic  testing was fairly extensive.  If there are genes identified to increase cancer risk that can be analyzed in the future, we would be happy to discuss and coordinate this testing at that time.    CANCER SCREENING RECOMMENDATIONS: Ms. Nordmann test result is considered negative (normal).  This means that we have not identified a hereditary cause for her personal history of breast cancer at this time. Most cancers happen by chance and this negative test suggests that her cancer may fall into this category.    While reassuring, this does not definitively rule out a hereditary predisposition to cancer. It is still possible that there could be genetic mutations that are undetectable by current technology. There could be genetic mutations in genes that have not been tested or identified to increase cancer risk.  Therefore, it is recommended she continue to follow the cancer management and screening guidelines provided by her oncology and primary healthcare provider.   An individual's cancer risk and medical management are not determined by genetic test results alone. Overall cancer risk assessment incorporates additional factors, including personal medical history, family history, and any available genetic information that may result in a personalized plan for cancer prevention and surveillance  RECOMMENDATIONS FOR FAMILY MEMBERS:  Individuals in this family might be at some increased risk of developing cancer, over the general population risk, simply due to the family history of cancer.  We recommended women in this family have a yearly mammogram beginning at age 18, or 28 years younger than the earliest onset of cancer, an annual clinical breast exam, and perform monthly breast  self-exams. Women in this family should also have a gynecological exam as recommended by their primary provider. All family members should be referred for colonoscopy starting at age 32.  FOLLOW-UP: Lastly, we discussed with Ms. Lomba  that cancer genetics is a rapidly advancing field and it is possible that new genetic tests will be appropriate for her and/or her family members in the future. We encouraged her to remain in contact with cancer genetics on an annual basis so we can update her personal and family histories and let her know of advances in cancer genetics that may benefit this family.   Our contact number was provided. Ms. Rader questions were answered to her satisfaction, and she knows she is welcome to call us at anytime with additional questions or concerns.   Roma Kayser, [place], [facility] Licensed, Certified Genetic Counselor [email or web address]

## 2021-09-23 NOTE — Telephone Encounter (Signed)
Revealed negative genetic testing.  Discussed that we do not know why she has breast cancer. It could be due to a different gene that we are not testing, or maybe our current technology may not be able to pick something up.  It will be important for her to keep in contact with genetics to keep up with whether additional testing may be needed.    

## 2021-09-26 DIAGNOSIS — D649 Anemia, unspecified: Secondary | ICD-10-CM

## 2021-09-26 HISTORY — DX: Anemia, unspecified: D64.9

## 2021-09-26 HISTORY — PX: BREAST LUMPECTOMY: SHX2

## 2021-10-01 ENCOUNTER — Encounter: Payer: Self-pay | Admitting: Physical Therapy

## 2021-10-01 ENCOUNTER — Ambulatory Visit: Payer: 59 | Attending: General Surgery | Admitting: Physical Therapy

## 2021-10-01 ENCOUNTER — Other Ambulatory Visit: Payer: Self-pay

## 2021-10-01 DIAGNOSIS — R293 Abnormal posture: Secondary | ICD-10-CM | POA: Diagnosis not present

## 2021-10-01 DIAGNOSIS — M79604 Pain in right leg: Secondary | ICD-10-CM | POA: Diagnosis present

## 2021-10-01 DIAGNOSIS — C50211 Malignant neoplasm of upper-inner quadrant of right female breast: Secondary | ICD-10-CM | POA: Diagnosis present

## 2021-10-01 DIAGNOSIS — Z170 Estrogen receptor positive status [ER+]: Secondary | ICD-10-CM | POA: Insufficient documentation

## 2021-10-01 NOTE — Therapy (Signed)
[facility] @ [facility] [place], [place], [address] Phone: [phone]   Fax:  [phone]  Physical Therapy Evaluation  Patient Details  Name: Emily Montoya MRN: [id number] Date of Birth: 1971/10/03 Referring Provider (PT): Joaquin Courts Date: 10/01/2021   PT End of Session - 10/01/21 1000     Visit Number 1    Number of Visits 2    Date for PT Re-Evaluation 10/29/21    PT Start Time 0906    PT Stop Time 1000    PT Time Calculation (min) 54 min    Activity Tolerance Patient tolerated treatment well    Behavior During Therapy [facility] for tasks assessed/performed             Past Medical History:  Diagnosis Date   Breast cancer ([place]) 08/20/2021   Sickle cell trait ([place])     Past Surgical History:  Procedure Laterality Date   TUBAL LIGATION      There were no vitals filed for this visit.    Subjective Assessment - 10/01/21 0907     Subjective I feel ok. I don't have any shoulder issues. I have been having pain in my right side that began last year in May. I couldn't walk. I could not get out of the chair and I had to call the ambulance. Lab work and x rays were clear. I have pain in my lower back and down my right leg. It feels like it is in the front of my leg. I have numbness and tingling in my right leg and foot.    Pertinent History R breast cancer ER+PR+, plan is to undergo a R breast lumpectomy and SLNB on 10/11/21    Patient Stated Goals to gain info from provider    Currently in Pain? No/denies                [facility] PT Assessment - 10/01/21 0001       Assessment   Medical Diagnosis R breast cancer    Referring Provider (PT) Marlou Starks    Onset Date/Surgical Date 10/11/21    Hand Dominance Right    Prior Therapy none      Precautions   Precautions Other (comment)    Precaution Comments active cancer      Balance Screen   Has the patient fallen in the past 6 months No    Has the patient  had a decrease in activity level because of a fear of falling?  No    Is the patient reluctant to leave their home because of a fear of falling?  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, art rep    Leisure pt does not exercise      Cognition   Overall Cognitive Status Within Functional Limits for tasks assessed      Functional Tests   Functional tests Sit to Stand      Sit to Stand   Comments 30 sec sit to stand: 15 reps with no use of UEs      Posture/Postural Control   Posture/Postural Control Postural limitations    Postural  Limitations Rounded Shoulders;Forward head      ROM / Strength   AROM / PROM / Strength AROM      AROM   AROM Assessment Site Shoulder    Right Shoulder Extension 58 Degrees    Right Shoulder Flexion 160 Degrees    Right Shoulder ABduction 177 Degrees    Right Shoulder Internal Rotation 68 Degrees    Right Shoulder External Rotation 87 Degrees    Left Shoulder Extension 68 Degrees    Left Shoulder Flexion 165 Degrees    Left Shoulder ABduction 175 Degrees    Left Shoulder Internal Rotation 51 Degrees    Left Shoulder External Rotation 90 Degrees               LYMPHEDEMA/ONCOLOGY QUESTIONNAIRE - 10/01/21 0001       Type   Cancer Type R breast cancer      Lymphedema Assessments   Lymphedema Assessments Upper extremities      Right Upper Extremity Lymphedema   15 cm Proximal to Olecranon Process 30.2 cm    Olecranon Process 26 cm    15 cm Proximal to Ulnar Styloid Process 24.2 cm    Just Proximal to Ulnar Styloid Process 15.4 cm    Across Hand at [employer] 19 cm    At [place] of 2nd Digit 5.5 cm      Left Upper Extremity Lymphedema   15 cm Proximal to Olecranon  Process 30.5 cm    Olecranon Process 25.5 cm    15 cm Proximal to Ulnar Styloid Process 23.6 cm    Just Proximal to Ulnar Styloid Process 15.2 cm    Across Hand at [employer] 19 cm    At [place] of 2nd Digit 5.5 cm             L-DEX FLOWSHEETS - 10/01/21 1000       L-DEX LYMPHEDEMA SCREENING   Measurement Type Unilateral    L-DEX MEASUREMENT EXTREMITY Upper Extremity    POSITION  Standing    DOMINANT SIDE Right    At Risk Side Right    BASELINE SCORE (UNILATERAL) -0.3             The patient was assessed using the L-Dex machine today to produce a lymphedema index baseline score. The patient will be reassessed on a regular basis (typically every 3 months) to obtain new L-Dex scores. If the score is > 6.5 points away from his/her baseline score indicating onset of subclinical lymphedema, it will be recommended to wear a compression garment for 4 weeks, 12 hours per day and then be reassessed. If the score continues to be > 6.5 points from baseline at reassessment, we will initiate lymphedema treatment. Assessing in this manner has a 95% rate of preventing clinically significant lymphedema.        Objective measurements completed on examination: See above findings.                PT Education - 10/01/21 1003     Education Details ABC class, post op shoulder ROM exercises, anatomy and physiology of lymphatic system, signs and symptoms of lymphedema, SOZO, lymphedema risk reduction practices    Person(s) Educated Patient;Other (comment)   sister   Methods Explanation;Handout    Comprehension Verbalized understanding                 PT Long Term Goals - 10/01/21 1002       PT LONG TERM GOAL #1  Title Pt will demonstrate a return to baseline shoulder ROM and not demonstrate any signs or symptoms of lymphedema.    Time 4    Period Weeks    Status New    Target Date 10/29/21             Breast Clinic Goals - 10/01/21 1002       Patient  will be able to verbalize understanding of pertinent lymphedema risk reduction practices relevant to her diagnosis specifically related to skin care.   Time 1    Period Days    Status Achieved      Patient will be able to return demonstrate and/or verbalize understanding of the post-op home exercise program related to regaining shoulder range of motion.   Time 1    Period Days    Status Achieved      Patient will be able to verbalize understanding of the importance of attending the postoperative After Breast Cancer Class for further lymphedema risk reduction education and therapeutic exercise.   Time 1    Period Days    Status Achieved                   Plan - 10/01/21 0941     Clinical Impression Statement Pt was diagnosed with R breast cancer that is ER+, PR-. She is beginning radiation and should be having a lumpectomy and SLNB on 10/11/21  and then will undergo radiation. Pt was instructed in post op exercises and educated that if she has a mastectomy then not to begin exercises until a week after the last drain is removed. Baseline ROM and SOZO measurements taken today. Pt reports she is having R LE pain and R low back pain. She has parathesias in her R leg and foot. She reports she had to call an ambulance because she was unable to walk one day and they did not find anything wrong. Educated pt to follow up with her oncologist regarding this. Will try some piriformis stretching when she returns to see if that helps. She will benefit from a post op PT reassessment to determine needs and in every 3 months for additional L dex screening to detect subclinical lymphedema.    Stability/Clinical Decision Making Stable/Uncomplicated    Clinical Decision Making Low    Rehab Potential Good    PT Frequency --   eval and 1 f/u visit   PT Duration 4 weeks    PT Treatment/Interventions ADLs/Self Care Home Management;Patient/family education;Therapeutic exercise    PT Next Visit Plan give  piriformis stretches, hamstring stretches, ITB stretches and see if that helps back pain, reassess baselines    PT Home Exercise Plan post op breast exercises    Consulted and Agree with Plan of Care Patient;Family member/caregiver    Family Member Consulted sister             Patient will benefit from skilled therapeutic intervention in order to improve the following deficits and impairments:  Pain, Postural dysfunction, Decreased knowledge of precautions  Visit Diagnosis: Abnormal posture  Pain in right leg  Malignant neoplasm of upper-inner quadrant of right breast in female, estrogen receptor positive ([place])   Patient will follow up at outpatient cancer rehab 3-4 weeks following surgery.  If the patient requires physical therapy at that time, a specific plan will be dictated and sent to the referring physician for approval. The patient was educated today on appropriate basic range of motion exercises to begin post operatively and the  importance of attending the After Breast Cancer class following surgery.  Patient was educated today on lymphedema risk reduction practices as it pertains to recommendations that will benefit the patient immediately following surgery.  She verbalized good understanding.     Problem List Patient Active Problem List   Diagnosis Date Noted   Genetic testing 09/23/2021   Malignant neoplasm of upper-inner quadrant of right breast in female, estrogen receptor positive ([place]) 08/29/2021    Allyson Sabal [place], PT 10/01/2021, 11:13 AM  [facility] @ [facility] [place], [place], [address] Phone: [phone]   Fax:  [phone]  Name: Emily Montoya MRN: [id number] Date of Birth: [birth year]

## 2021-10-02 NOTE — Telephone Encounter (Signed)
Connected with [employer] MCDONALD [phone]) to check status of leave form.  Lumpectomy scheduled 10/10/2021. "Delivered form to Dr. Ethlyn Gallery office, paid $25.00 waiting for return.  Not sure why my employer instructed me to 'back date' form, I have not missed work days.  Worked different or less hours for appointments.  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  Leave time is reported per hours not work days.  Provided [email or web address] to resend form [place] if needed.  All appointment information is usually requested with these forms helps employer code time away as FMLA.

## 2021-10-03 ENCOUNTER — Encounter (HOSPITAL_BASED_OUTPATIENT_CLINIC_OR_DEPARTMENT_OTHER): Payer: Self-pay | Admitting: General Surgery

## 2021-10-03 ENCOUNTER — Other Ambulatory Visit: Payer: Self-pay

## 2021-10-06 ENCOUNTER — Other Ambulatory Visit: Payer: Self-pay | Admitting: Hematology

## 2021-10-10 ENCOUNTER — Ambulatory Visit
Admission: RE | Admit: 2021-10-10 | Discharge: 2021-10-10 | Disposition: A | Discharge status: Home / Self Care | Payer: 59 | Source: Ambulatory Visit | Attending: General Surgery | Admitting: General Surgery

## 2021-10-10 ENCOUNTER — Other Ambulatory Visit: Payer: Self-pay | Admitting: General Surgery

## 2021-10-10 DIAGNOSIS — C50211 Malignant neoplasm of upper-inner quadrant of right female breast: Secondary | ICD-10-CM

## 2021-10-10 DIAGNOSIS — Z170 Estrogen receptor positive status [ER+]: Secondary | ICD-10-CM

## 2021-10-10 NOTE — Progress Notes (Signed)
° ° ° ° °  Enhanced Recovery after Surgery for Orthopedics Enhanced Recovery after Surgery is a protocol used to improve the stress on your body and your recovery after surgery.  Patient Instructions  The night before surgery:  No food after midnight. ONLY clear liquids after midnight  The day of surgery (if you do NOT have diabetes):  Drink ONE (1) Pre-Surgery Clear Ensure as directed.   This drink was given to you during your hospital    pre-op appointment visit. The pre-op nurse will instruct you on the time to drink the  Pre-Surgery Ensure depending on your surgery time. Finish the drink at the designated time by the pre-op nurse.  Nothing else to drink after completing the  Pre-Surgery Clear Ensure.  The day of surgery (if you have diabetes): Drink ONE (1) Gatorade 2 (G2) as directed. This drink was given to you during your hospital  pre-op appointment visit.  The pre-op nurse will instruct you on the time to drink the   Gatorade 2 (G2) depending on your surgery time. Color of the Gatorade may vary. Red is not allowed. Nothing else to drink after completing the  Gatorade 2 (G2).         If you have questions, please contact your surgeons office.  Surgical soap given with instructions. Patient verbalized understanding.

## 2021-10-10 NOTE — Progress Notes (Signed)
Text sent reminding to come to [facility] to pick up a drink/soap today before surgery.

## 2021-10-11 ENCOUNTER — Other Ambulatory Visit: Payer: Self-pay

## 2021-10-11 ENCOUNTER — Ambulatory Visit
Admission: RE | Admit: 2021-10-11 | Discharge: 2021-10-11 | Disposition: A | Discharge status: Home / Self Care | Payer: 59 | Source: Ambulatory Visit | Attending: General Surgery | Admitting: General Surgery

## 2021-10-11 ENCOUNTER — Ambulatory Visit (HOSPITAL_BASED_OUTPATIENT_CLINIC_OR_DEPARTMENT_OTHER)
Admission: RE | Admit: 2021-10-11 | Discharge: 2021-10-11 | Disposition: A | Discharge status: Home / Self Care | Payer: 59 | Attending: General Surgery | Admitting: General Surgery

## 2021-10-11 ENCOUNTER — Ambulatory Visit (HOSPITAL_BASED_OUTPATIENT_CLINIC_OR_DEPARTMENT_OTHER): Payer: 59 | Admitting: Anesthesiology

## 2021-10-11 ENCOUNTER — Encounter (HOSPITAL_BASED_OUTPATIENT_CLINIC_OR_DEPARTMENT_OTHER): Payer: Self-pay | Admitting: General Surgery

## 2021-10-11 ENCOUNTER — Encounter (HOSPITAL_BASED_OUTPATIENT_CLINIC_OR_DEPARTMENT_OTHER)
Admission: RE | Disposition: A | Discharge status: Home / Self Care | Payer: Self-pay | Source: Home / Self Care | Attending: General Surgery

## 2021-10-11 DIAGNOSIS — Z170 Estrogen receptor positive status [ER+]: Secondary | ICD-10-CM

## 2021-10-11 DIAGNOSIS — C50211 Malignant neoplasm of upper-inner quadrant of right female breast: Secondary | ICD-10-CM | POA: Diagnosis not present

## 2021-10-11 DIAGNOSIS — F17290 Nicotine dependence, other tobacco product, uncomplicated: Secondary | ICD-10-CM | POA: Insufficient documentation

## 2021-10-11 DIAGNOSIS — D573 Sickle-cell trait: Secondary | ICD-10-CM | POA: Diagnosis not present

## 2021-10-11 HISTORY — PX: BREAST LUMPECTOMY WITH RADIOACTIVE SEED AND SENTINEL LYMPH NODE BIOPSY: SHX6550

## 2021-10-11 HISTORY — PX: BREAST LUMPECTOMY: SHX2

## 2021-10-11 LAB — POCT PREGNANCY, URINE: Preg Test, Ur: NEGATIVE

## 2021-10-11 SURGERY — BREAST LUMPECTOMY WITH RADIOACTIVE SEED AND SENTINEL LYMPH NODE BIOPSY
Anesthesia: General | Site: Breast | Laterality: Right

## 2021-10-11 MED ORDER — PHENYLEPHRINE 40 MCG/ML (10ML) SYRINGE FOR IV PUSH (FOR BLOOD PRESSURE SUPPORT)
PREFILLED_SYRINGE | INTRAVENOUS | Status: AC
  Filled 2021-10-11: qty 10

## 2021-10-11 MED ORDER — PROPOFOL 10 MG/ML IV BOLUS
INTRAVENOUS | Status: AC
  Filled 2021-10-11: qty 20

## 2021-10-11 MED ORDER — MEPERIDINE HCL 25 MG/ML IJ SOLN: 6.2500 mg | INTRAMUSCULAR | Status: DC | PRN

## 2021-10-11 MED ORDER — FENTANYL CITRATE (PF) 100 MCG/2ML IJ SOLN
INTRAMUSCULAR | Status: AC
  Filled 2021-10-11: qty 2

## 2021-10-11 MED ORDER — DEXAMETHASONE SODIUM PHOSPHATE 10 MG/ML IJ SOLN
INTRAMUSCULAR | Status: AC
  Filled 2021-10-11: qty 1

## 2021-10-11 MED ORDER — HYDROMORPHONE HCL 1 MG/ML IJ SOLN
INTRAMUSCULAR | Status: AC
  Filled 2021-10-11: qty 0.5

## 2021-10-11 MED ORDER — FENTANYL CITRATE (PF) 100 MCG/2ML IJ SOLN
100.0000 ug | Freq: Once | INTRAMUSCULAR | Status: AC
  Administered 2021-10-11: 100 ug via INTRAVENOUS

## 2021-10-11 MED ORDER — MAGTRACE LYMPHATIC TRACER
INTRAMUSCULAR | Status: DC | PRN
  Administered 2021-10-11: 2 mL via INTRAMUSCULAR

## 2021-10-11 MED ORDER — CHLORHEXIDINE GLUCONATE CLOTH 2 % EX PADS: 6.0000 | MEDICATED_PAD | Freq: Once | CUTANEOUS | Status: DC

## 2021-10-11 MED ORDER — OXYCODONE HCL 5 MG PO TABS: 5.0000 mg | ORAL_TABLET | Freq: Once | ORAL | Status: DC | PRN

## 2021-10-11 MED ORDER — FENTANYL CITRATE (PF) 100 MCG/2ML IJ SOLN
INTRAMUSCULAR | Status: DC | PRN
  Administered 2021-10-11 (×4): 25 ug via INTRAVENOUS

## 2021-10-11 MED ORDER — ACETAMINOPHEN 500 MG PO TABS
1000.0000 mg | ORAL_TABLET | ORAL | Status: AC
  Administered 2021-10-11: 1000 mg via ORAL

## 2021-10-11 MED ORDER — EPHEDRINE 5 MG/ML INJ
INTRAVENOUS | Status: AC
  Filled 2021-10-11: qty 5

## 2021-10-11 MED ORDER — AMISULPRIDE (ANTIEMETIC) 5 MG/2ML IV SOLN
INTRAVENOUS | Status: AC
  Filled 2021-10-11: qty 2

## 2021-10-11 MED ORDER — AMISULPRIDE (ANTIEMETIC) 5 MG/2ML IV SOLN
10.0000 mg | Freq: Once | INTRAVENOUS | Status: AC | PRN
  Administered 2021-10-11: 10 mg via INTRAVENOUS

## 2021-10-11 MED ORDER — MIDAZOLAM HCL 2 MG/2ML IJ SOLN
INTRAMUSCULAR | Status: AC
  Filled 2021-10-11: qty 2

## 2021-10-11 MED ORDER — PROPOFOL 10 MG/ML IV BOLUS
INTRAVENOUS | Status: DC | PRN
  Administered 2021-10-11: 200 mg via INTRAVENOUS

## 2021-10-11 MED ORDER — BUPIVACAINE-EPINEPHRINE (PF) 0.25% -1:200000 IJ SOLN
INTRAMUSCULAR | Status: DC | PRN
  Administered 2021-10-11: 24 mL

## 2021-10-11 MED ORDER — MIDAZOLAM HCL 2 MG/2ML IJ SOLN
2.0000 mg | Freq: Once | INTRAMUSCULAR | Status: AC
  Administered 2021-10-11: 2 mg via INTRAVENOUS

## 2021-10-11 MED ORDER — LIDOCAINE HCL (CARDIAC) PF 100 MG/5ML IV SOSY
PREFILLED_SYRINGE | INTRAVENOUS | Status: DC | PRN
  Administered 2021-10-11: 40 mg via INTRAVENOUS

## 2021-10-11 MED ORDER — ONDANSETRON HCL 4 MG/2ML IJ SOLN
INTRAMUSCULAR | Status: DC | PRN
  Administered 2021-10-11: 4 mg via INTRAVENOUS

## 2021-10-11 MED ORDER — OXYCODONE HCL 5 MG/5ML PO SOLN: 5.0000 mg | Freq: Once | ORAL | Status: DC | PRN

## 2021-10-11 MED ORDER — LIDOCAINE 2% (20 MG/ML) 5 ML SYRINGE
INTRAMUSCULAR | Status: AC
  Filled 2021-10-11: qty 5

## 2021-10-11 MED ORDER — TRAMADOL HCL 50 MG PO TABS: 50.0000 mg | ORAL_TABLET | Freq: Four times a day (QID) | ORAL | 0 refills | Status: DC | PRN

## 2021-10-11 MED ORDER — LACTATED RINGERS IV SOLN: INTRAVENOUS | Status: DC

## 2021-10-11 MED ORDER — DEXAMETHASONE SODIUM PHOSPHATE 10 MG/ML IJ SOLN
INTRAMUSCULAR | Status: DC | PRN
  Administered 2021-10-11: 10 mg via INTRAVENOUS

## 2021-10-11 MED ORDER — GABAPENTIN 300 MG PO CAPS
300.0000 mg | ORAL_CAPSULE | ORAL | Status: AC
  Administered 2021-10-11: 300 mg via ORAL

## 2021-10-11 MED ORDER — ONDANSETRON HCL 4 MG/2ML IJ SOLN
INTRAMUSCULAR | Status: AC
  Filled 2021-10-11: qty 2

## 2021-10-11 MED ORDER — ACETAMINOPHEN 500 MG PO TABS
ORAL_TABLET | ORAL | Status: AC
  Filled 2021-10-11: qty 2

## 2021-10-11 MED ORDER — PROMETHAZINE HCL 25 MG/ML IJ SOLN: 6.2500 mg | INTRAMUSCULAR | Status: DC | PRN

## 2021-10-11 MED ORDER — GABAPENTIN 300 MG PO CAPS
ORAL_CAPSULE | ORAL | Status: AC
  Filled 2021-10-11: qty 1

## 2021-10-11 MED ORDER — ROPIVACAINE HCL 5 MG/ML IJ SOLN
INTRAMUSCULAR | Status: DC | PRN
  Administered 2021-10-11: 30 mL via PERINEURAL

## 2021-10-11 MED ORDER — HYDROMORPHONE HCL 1 MG/ML IJ SOLN
0.2500 mg | INTRAMUSCULAR | Status: DC | PRN
  Administered 2021-10-11 (×2): 0.5 mg via INTRAVENOUS

## 2021-10-11 MED ORDER — PHENYLEPHRINE HCL (PRESSORS) 10 MG/ML IV SOLN
INTRAVENOUS | Status: DC | PRN
  Administered 2021-10-11: 80 ug via INTRAVENOUS
  Administered 2021-10-11: 40 ug via INTRAVENOUS

## 2021-10-11 MED ORDER — CEFAZOLIN SODIUM-DEXTROSE 2-4 GM/100ML-% IV SOLN
2.0000 g | INTRAVENOUS | Status: AC
  Administered 2021-10-11: 2 g via INTRAVENOUS

## 2021-10-11 MED ORDER — EPHEDRINE SULFATE 50 MG/ML IJ SOLN
INTRAMUSCULAR | Status: DC | PRN
Start: 2021-10-11 — End: 2021-10-11
  Administered 2021-10-11 (×2): 10 mg via INTRAVENOUS

## 2021-10-11 MED ORDER — CEFAZOLIN SODIUM-DEXTROSE 2-4 GM/100ML-% IV SOLN
INTRAVENOUS | Status: AC
  Filled 2021-10-11: qty 100

## 2021-10-11 SURGICAL SUPPLY — 47 items
ADH SKN CLS APL DERMABOND .7 (GAUZE/BANDAGES/DRESSINGS) ×2
APL PRP STRL LF DISP 70% ISPRP (MISCELLANEOUS) ×2
APPLIER CLIP 9.375 MED OPEN (MISCELLANEOUS) ×8
APR CLP MED 9.3 20 MLT OPN (MISCELLANEOUS) ×4
BLADE SURG 15 STRL LF DISP TIS (BLADE) ×2 IMPLANT
BLADE SURG 15 STRL SS (BLADE) ×4
CANISTER SUC SOCK COL 7IN (MISCELLANEOUS) ×1 IMPLANT
CANISTER SUCT 1200ML W/VALVE (MISCELLANEOUS) ×4 IMPLANT
CHLORAPREP W/TINT 26 (MISCELLANEOUS) ×4 IMPLANT
CLIP APPLIE 9.375 MED OPEN (MISCELLANEOUS) ×2 IMPLANT
COVER BACK TABLE 60X90IN (DRAPES) ×4 IMPLANT
COVER MAYO STAND STRL (DRAPES) ×4 IMPLANT
COVER PROBE W GEL 5X96 (DRAPES) ×4 IMPLANT
DECANTER SPIKE VIAL GLASS SM (MISCELLANEOUS) IMPLANT
DERMABOND ADVANCED (GAUZE/BANDAGES/DRESSINGS) ×2
DERMABOND ADVANCED .7 DNX12 (GAUZE/BANDAGES/DRESSINGS) ×2 IMPLANT
DRAPE LAPAROSCOPIC ABDOMINAL (DRAPES) ×4 IMPLANT
DRAPE UTILITY XL STRL (DRAPES) ×4 IMPLANT
ELECT COATED BLADE 2.86 ST (ELECTRODE) ×4 IMPLANT
ELECT REM PT RETURN 9FT ADLT (ELECTROSURGICAL) ×4
ELECTRODE REM PT RTRN 9FT ADLT (ELECTROSURGICAL) ×2 IMPLANT
GLOVE SURG ENC MOIS LTX SZ7.5 (GLOVE) ×8 IMPLANT
GLOVE SURG POLYISO LF SZ6.5 (GLOVE) ×3 IMPLANT
GLOVE SURG POLYISO LF SZ7 (GLOVE) ×3 IMPLANT
GLOVE SURG UNDER POLY LF SZ7 (GLOVE) ×6 IMPLANT
GOWN STRL REUS W/ TWL LRG LVL3 (GOWN DISPOSABLE) ×5 IMPLANT
GOWN STRL REUS W/TWL LRG LVL3 (GOWN DISPOSABLE) ×12
ILLUMINATOR WAVEGUIDE N/F (MISCELLANEOUS) IMPLANT
KIT MARKER MARGIN INK (KITS) ×4 IMPLANT
LIGHT WAVEGUIDE WIDE FLAT (MISCELLANEOUS) IMPLANT
NDL HYPO 25X1 1.5 SAFETY (NEEDLE) IMPLANT
NEEDLE HYPO 25X1 1.5 SAFETY (NEEDLE) ×4 IMPLANT
NS IRRIG 1000ML POUR BTL (IV SOLUTION) IMPLANT
PACK BASIN DAY SURGERY FS (CUSTOM PROCEDURE TRAY) ×4 IMPLANT
PENCIL SMOKE EVACUATOR (MISCELLANEOUS) ×4 IMPLANT
SLEEVE SCD COMPRESS KNEE MED (STOCKING) ×4 IMPLANT
SPONGE T-LAP 18X18 ~~LOC~~+RFID (SPONGE) ×4 IMPLANT
SUT MON AB 4-0 PC3 18 (SUTURE) ×7 IMPLANT
SUT SILK 2 0 SH (SUTURE) IMPLANT
SUT VICRYL 3-0 CR8 SH (SUTURE) ×4 IMPLANT
SYR CONTROL 10ML LL (SYRINGE) ×3 IMPLANT
TOWEL GREEN STERILE FF (TOWEL DISPOSABLE) ×4 IMPLANT
TRACER MAGTRACE VIAL (MISCELLANEOUS) ×3 IMPLANT
TRAY FAXITRON CT DISP (TRAY / TRAY PROCEDURE) ×4 IMPLANT
TUBE CONNECTING 20'X1/4 (TUBING) ×1
TUBE CONNECTING 20X1/4 (TUBING) ×3 IMPLANT
YANKAUER SUCT BULB TIP NO VENT (SUCTIONS) ×3 IMPLANT

## 2021-10-11 NOTE — Op Note (Signed)
10/11/2021  1:01 PM  PATIENT:  Emily Montoya   50 y.o. female  PRE-OPERATIVE DIAGNOSIS:  RIGHT BREAST CANCER  POST-OPERATIVE DIAGNOSIS:  RIGHT BREAST CANCER  PROCEDURE:  Procedure(s): RIGHT BREAST LUMPECTOMY WITH RADIOACTIVE SEED LOCALIZATION AND DEEP RIGHT AXILLARY SENTINEL LYMPH NODE BIOPSY (Right)  SURGEON:  Surgeon(s) and Role:    * [name], MD - Primary  PHYSICIAN ASSISTANT:   ASSISTANTS: none   ANESTHESIA:   local and general  EBL:  10 mL   BLOOD ADMINISTERED:none  DRAINS: none   LOCAL MEDICATIONS USED:  MARCAINE     SPECIMEN:  Source of Specimen:  RIGHT BREAST TISSUE AND SENTINEL NODES X 2  DISPOSITION OF SPECIMEN:  PATHOLOGY  COUNTS:  YES  TOURNIQUET:  * No tourniquets in log *  DICTATION: .Dragon Dictation  After informed consent was obtained the patient was brought to the operating room and placed in the supine position on the operating table.  After adequate induction of general anesthesia the patient's right chest, breast, and axillary area were prepped with ChloraPrep, allowed to dry, and draped in usual sterile manner.  An appropriate timeout was performed.  At this point 2 cc of iron oxide were injected in the subareolar plexus on the right.  The breast was massaged for 5 minutes.  Previously an I-125 seed was placed in the far upper inner quadrant of the right breast to mark an area of invasive breast cancer.  Attention was first turned to the right axilla.  The mag trace was used to identify a signal in the right axilla.  The area overlying this was infiltrated with quarter percent Marcaine.  A small transversely oriented incision was made with a 15 blade knife overlying the area of signal.  The incision was carried through the skin and subcutaneous tissue sharply with electrocautery until the deep right axillary space was entered.  Using the mag trace I was able to identify 2 areas of signal.  These areas were excised sharply with the  electrocautery and the surrounding small vessels and lymphatics were controlled with clips.  I suspect that there was 2 or 3 nodes potentially in each section.  No other hot or palpable nodes were identified in the right axilla at this point.  Hemostasis was achieved using the Bovie electrocautery.  The deep layer of the incision was then closed with interrupted 3-0 Vicryl stitches.  The skin was then closed with a running 4-0 Monocryl subcuticular stitch.  Attention was then turned to the right breast.  The neoprobe was set to I-125 in the area of radioactivity was readily identified at the very top of the breast tissue in the upper inner quadrant of the right breast.  Because of its location I elected to make an incision overlying this area with a 15 blade knife.  The incision was carried through the skin and subcutaneous tissue sharply with the electrocautery.  Dissection was then carried out superiorly between the breast tissue and the subcutaneous fat and skin until we were well beyond the area of the cancer.  I then removed a circular portion of breast tissue sharply with the electrocautery around the radioactive seed while checking the area of radioactivity frequently.  This dissection was carried all the way to the muscle of the chest wall.  Once the specimen was removed it was oriented with the appropriate paint colors.  A specimen radiograph was obtained that showed the clip and seed to be near the center of the specimen.  The specimen was then sent to pathology for further evaluation.  Hemostasis was achieved using the Bovie electrocautery.  The wound was irrigated with saline and infiltrated with more quarter percent Marcaine.  The deep layer of the wound was closed with interrupted 3-0 Vicryl stitches.  The skin was then closed with a running 4-0 Monocryl subcuticular stitch.  Dermabond dressings were applied.  The patient tolerated the procedure well.  At the end of the case all needle sponge and  instrument counts were correct.  The patient was then awakened and taken recovery in stable condition.  PLAN OF CARE: Discharge to home after PACU  PATIENT DISPOSITION:  PACU - hemodynamically stable.   Delay start of Pharmacological VTE agent (>24hrs) due to surgical blood loss or risk of bleeding: not applicable

## 2021-10-11 NOTE — Interval H&P Note (Signed)
History and Physical Interval Note:  10/11/2021 11:21 AM  Emily Montoya  has presented today for surgery, with the diagnosis of RIGHT BREAST CANCER.  The various methods of treatment have been discussed with the patient and family. After consideration of risks, benefits and other options for treatment, the patient has consented to  Procedure(s): RIGHT BREAST LUMPECTOMY WITH RADIOACTIVE SEED LOCALIZATION AND SENTINEL LYMPH NODE BIOPSY (Right) as a surgical intervention.  The patient's history has been reviewed, patient examined, no change in status, stable for surgery.  I have reviewed the patient's chart and labs.  Questions were answered to the patient's satisfaction.     [name]

## 2021-10-11 NOTE — Anesthesia Procedure Notes (Signed)
Procedure Name: LMA Insertion Date/Time: 10/11/2021 11:42 AM Performed by: Lavonia Dana, CRNA Pre-anesthesia Checklist: Patient identified, Emergency Drugs available, Suction available and Patient being monitored Patient Re-evaluated:Patient Re-evaluated prior to induction Oxygen Delivery Method: Circle system utilized Preoxygenation: Pre-oxygenation with 100% oxygen Induction Type: IV induction Ventilation: Mask ventilation without difficulty LMA: LMA inserted LMA Size: 4.0 Number of attempts: 1 Airway Equipment and Method: Bite block Placement Confirmation: positive ETCO2 Tube secured with: Tape Dental Injury: Teeth and Oropharynx as per pre-operative assessment

## 2021-10-11 NOTE — Anesthesia Preprocedure Evaluation (Signed)
Anesthesia Evaluation  Patient identified by MRN, date of birth, ID band Patient awake    Reviewed: Allergy & Precautions, NPO status , Patient's Chart, lab work & pertinent test results  Airway Mallampati: II  TM Distance: >3 FB Neck ROM: Full    Dental no notable dental hx.    Pulmonary neg pulmonary ROS, former smoker,    Pulmonary exam normal breath sounds clear to auscultation       Cardiovascular negative cardio ROS Normal cardiovascular exam Rhythm:Regular Rate:Normal     Neuro/Psych negative neurological ROS  negative psych ROS   GI/Hepatic negative GI ROS, Neg liver ROS,   Endo/Other  negative endocrine ROS  Renal/GU negative Renal ROS  negative genitourinary   Musculoskeletal negative musculoskeletal ROS (+)   Abdominal   Peds negative pediatric ROS (+)  Hematology negative hematology ROS (+)   Anesthesia Other Findings Breast Cancer  Reproductive/Obstetrics negative OB ROS                             Anesthesia Physical Anesthesia Plan  ASA: 3  Anesthesia Plan: General   Post-op Pain Management: Regional block   Induction: Intravenous  PONV Risk Score and Plan: 3 and Ondansetron, Dexamethasone, Midazolam and Treatment may vary due to age or medical condition  Airway Management Planned: LMA  Additional Equipment:   Intra-op Plan:   Post-operative Plan: Extubation in OR  Informed Consent: I have reviewed the patients History and Physical, chart, labs and discussed the procedure including the risks, benefits and alternatives for the proposed anesthesia with the patient or authorized representative who has indicated his/her understanding and acceptance.     Dental advisory given  Plan Discussed with: CRNA  Anesthesia Plan Comments:         Anesthesia Quick Evaluation

## 2021-10-11 NOTE — H&P (Signed)
REFERRING PHYSICIAN: Jarome Matin, MD  PROVIDER: Landry Corporal, MD  MRN: [id number] DOB: 04/12/1971 Subjective  Chief Complaint: No chief complaint on file.   History of Present Illness: Emily Montoya is a 50 y.o. female who is seen today as an office consultation at the request of Dr. Landry Mellow for evaluation of No chief complaint on file. .   We are asked to see the patient in consultation by Dr. Christophe Louis to evaluate her for a new right breast cancer. The patient is a 50 year old black female who has felt a mass in the upper portion of the right breast for about 6 months or so. She finally underwent evaluation with mammogram and ultrasound. She was found to have a 1.1 cm mass in the upper inner quadrant of the right breast with clinically normal looking lymph nodes. The mass was biopsied and came back as an invasive ductal cancer that was ER and PR positive and HER2 negative with a Ki-67 of 15%. She has been smoking 3-4 black and milds a day but quit after hearing about the cancer. She otherwise has some sickle cell trait. All of this work-up started when she had an episode of pain all over that she could not define  Review of Systems: A complete review of systems was obtained from the patient. I have reviewed this information and discussed as appropriate with the patient. See HPI as well for other ROS.  ROS   Medical History: Past Medical History:  Diagnosis Date   Anemia   Patient Active Problem List  Diagnosis   Malignant neoplasm of upper-inner quadrant of right breast in female, estrogen receptor positive (CMS-HCC)   Past Surgical History:  Procedure Laterality Date   INCISIONAL BIOPSY BREAST    No Known Allergies  No current outpatient medications on file prior to visit.   No current facility-administered medications on file prior to visit.   History reviewed. No pertinent family history.   Social History   Tobacco Use  Smoking Status Never Smoker   Smokeless Tobacco Never Used    Social History   Socioeconomic History   Marital status: Legally Separated  Tobacco Use   Smoking status: Never Smoker   Smokeless tobacco: Never Used  Scientific laboratory technician Use: Never used  Substance and Sexual Activity   Alcohol use: Yes   Drug use: Never   Objective:   Vitals:  BP: 110/80  Pulse: 52  Temp: 37.1 C (98.7 F)  SpO2: 97%  Weight: 72.9 kg (160 lb 12.8 oz)  Height: 158.8 cm (5' 2.5")   Body mass index is 28.94 kg/m.  Physical Exam Vitals reviewed.  Constitutional:  General: She is not in acute distress. Appearance: Normal appearance.  HENT:  Head: Normocephalic and atraumatic.  Right Ear: External ear normal.  Left Ear: External ear normal.  Nose: Nose normal.  Mouth/Throat:  Mouth: Mucous membranes are moist.  Pharynx: Oropharynx is clear.  Eyes:  General: No scleral icterus. Extraocular Movements: Extraocular movements intact.  Conjunctiva/sclera: Conjunctivae normal.  Pupils: Pupils are equal, round, and reactive to light.  Cardiovascular:  Rate and Rhythm: Normal rate and regular rhythm.  Pulses: Normal pulses.  Heart sounds: Normal heart sounds.  Pulmonary:  Effort: Pulmonary effort is normal. No respiratory distress.  Breath sounds: Normal breath sounds.  Abdominal:  General: Bowel sounds are normal.  Palpations: Abdomen is soft.  Tenderness: There is no abdominal tenderness.  Musculoskeletal:  General: No swelling, tenderness or deformity. Normal range of  motion.  Cervical back: Normal range of motion and neck supple.  Skin: General: Skin is warm and dry.  Coloration: Skin is not jaundiced.  Neurological:  General: No focal deficit present.  Mental Status: She is alert and oriented to person, place, and time.  Psychiatric:  Mood and Affect: Mood normal.  Behavior: Behavior normal.     Breast: There is no definite palpable mass in either breast. There is no palpable axillary, supraclavicular,  or cervical lymphadenopathy. She does have accessory nipples on each side both above and below the breast  Labs, Imaging and Diagnostic Testing:  Assessment and Plan:  Diagnoses and all orders for this visit:  Malignant neoplasm of upper-inner quadrant of right breast in female, estrogen receptor positive (CMS-HCC) - Ambulatory Referral to Oncology-Medical - Ambulatory Referral to Physical Therapy - Ambulatory Referral to Radiation Oncology - [facility] Case Posting Request; Future    The patient appears to have a small stage I cancer in the upper inner quadrant of the right breast with clinically negative nodes. I have discussed with her in detail the different options for treatment and at this point she has not completely decided between breast conservation and mastectomy. She will let us know what she decides and then we will move forward with scheduling. I will refer her to medical and radiation oncology to discuss adjuvant therapy. I will also refer her to physical therapy for preoperative lymphedema testing   She has decided on breast conservation with sentinel node biopsy.

## 2021-10-11 NOTE — Transfer of Care (Signed)
Immediate Anesthesia Transfer of Care Note  Patient: Emily Montoya  Procedure(s) Performed: RIGHT BREAST LUMPECTOMY WITH RADIOACTIVE SEED LOCALIZATION AND SENTINEL LYMPH NODE BIOPSY (Right: Breast)  Patient Location: PACU  Anesthesia Type:GA combined with regional for post-op pain  Level of Consciousness: drowsy  Airway & Oxygen Therapy: Patient Spontanous Breathing and Patient connected to face mask oxygen  Post-op Assessment: Report given to RN and Post -op Vital signs reviewed and stable  Post vital signs: Reviewed and stable  Last Vitals:  Vitals Value Taken Time  BP 112/72 10/11/21 1310  Temp    Pulse 77 10/11/21 1312  Resp 15 10/11/21 1312  SpO2 99 % 10/11/21 1312  Vitals shown include unvalidated device data.  Last Pain:  Vitals:   10/11/21 1049  TempSrc: Oral  PainSc: 3          Complications: No notable events documented.

## 2021-10-11 NOTE — Anesthesia Procedure Notes (Signed)
Anesthesia Regional Block: Pectoralis block   Pre-Anesthetic Checklist: , timeout performed,  Correct Patient, Correct Site, Correct Laterality,  Correct Procedure, Correct Position, site marked,  Risks and benefits discussed,  Surgical consent,  Pre-op evaluation,  At surgeon's request and post-op pain management  Laterality: Right  Prep: chloraprep       Needles:   Needle Type: Stimiplex     Needle Length: 9cm  Needle Gauge: 21     Additional Needles:   Procedures:,,,, ultrasound used (permanent image in chart),,    Narrative:  Start time: 10/11/2021 11:27 AM End time: 10/11/2021 11:32 AM Injection made incrementally with aspirations every 5 mL.  Performed by: Personally  Anesthesiologist: Lynda Rainwater, MD

## 2021-10-11 NOTE — Discharge Instructions (Addendum)
°  Post Anesthesia Home Care Instructions  Activity: Get plenty of rest for the remainder of the day. A responsible individual must stay with you for 24 hours following the procedure.  For the next 24 hours, DO NOT: -Drive a car -Paediatric nurse -Drink alcoholic beverages -Take any medication unless instructed by your physician -Make any legal decisions or sign important papers.  Meals: Start with liquid foods such as gelatin or soup. Progress to regular foods as tolerated. Avoid greasy, spicy, heavy foods. If nausea and/or vomiting occur, drink only clear liquids until the nausea and/or vomiting subsides. Call your physician if vomiting continues.  Special Instructions/Symptoms: Your throat may feel dry or sore from the anesthesia or the breathing tube placed in your throat during surgery. If this causes discomfort, gargle with warm salt water. The discomfort should disappear within 24 hours.  If you had a scopolamine patch placed behind your ear for the management of post- operative nausea and/or vomiting:  1. The medication in the patch is effective for 72 hours, after which it should be removed.  Wrap patch in a tissue and discard in the trash. Wash hands thoroughly with soap and water. 2. You may remove the patch earlier than 72 hours if you experience unpleasant side effects which may include dry mouth, dizziness or visual disturbances. 3. Avoid touching the patch. Wash your hands with soap and water after contact with the patch.      No tylenol until after 5:00pm if needed.

## 2021-10-11 NOTE — Progress Notes (Signed)
Assisted Dr. Miller with right, ultrasound guided, pectoralis block. Side rails up, monitors on throughout procedure. See vital signs in flow sheet. Tolerated Procedure well. 

## 2021-10-14 ENCOUNTER — Encounter (HOSPITAL_BASED_OUTPATIENT_CLINIC_OR_DEPARTMENT_OTHER): Payer: Self-pay | Admitting: General Surgery

## 2021-10-14 LAB — SURGICAL PATHOLOGY

## 2021-10-20 NOTE — Anesthesia Postprocedure Evaluation (Signed)
Anesthesia Post Note  Patient: LOREE SHEHATA  Procedure(s) Performed: RIGHT BREAST LUMPECTOMY WITH RADIOACTIVE SEED LOCALIZATION AND SENTINEL LYMPH NODE BIOPSY (Right: Breast)     Patient location during evaluation: PACU Anesthesia Type: General and Regional Level of consciousness: awake and alert Pain management: pain level controlled Vital Signs Assessment: post-procedure vital signs reviewed and stable Respiratory status: spontaneous breathing, nonlabored ventilation, respiratory function stable and patient connected to nasal cannula oxygen Cardiovascular status: blood pressure returned to baseline and stable Postop Assessment: no apparent nausea or vomiting Anesthetic complications: no   No notable events documented.  Last Vitals:  Vitals:   10/11/21 1415 10/11/21 1430  BP: 108/64 113/73  Pulse: 71 66  Resp: 17 18  Temp:  36.8 C  SpO2: 95% 94%    Last Pain:  Vitals:   10/11/21 1049  TempSrc: Oral                 Belenda Cruise P Adyen Bifulco

## 2021-10-25 ENCOUNTER — Encounter: Payer: Self-pay | Admitting: Hematology

## 2021-10-27 DIAGNOSIS — I890 Lymphedema, not elsewhere classified: Secondary | ICD-10-CM

## 2021-10-27 DIAGNOSIS — F419 Anxiety disorder, unspecified: Secondary | ICD-10-CM

## 2021-10-27 HISTORY — DX: Anxiety disorder, unspecified: F41.9

## 2021-10-27 HISTORY — DX: Lymphedema, not elsewhere classified: I89.0

## 2021-10-29 ENCOUNTER — Telehealth: Payer: Self-pay | Admitting: *Deleted

## 2021-10-29 ENCOUNTER — Encounter: Payer: Self-pay | Admitting: *Deleted

## 2021-10-29 NOTE — Telephone Encounter (Signed)
Ordered oncotype per Dr. Feng.  Faxed requisition to pathology.   

## 2021-10-30 ENCOUNTER — Encounter: Payer: Self-pay | Admitting: Physical Therapy

## 2021-10-30 ENCOUNTER — Telehealth: Payer: Self-pay

## 2021-10-30 ENCOUNTER — Ambulatory Visit: Payer: BC Managed Care – PPO | Attending: General Surgery | Admitting: Physical Therapy

## 2021-10-30 ENCOUNTER — Other Ambulatory Visit: Payer: Self-pay

## 2021-10-30 DIAGNOSIS — C50211 Malignant neoplasm of upper-inner quadrant of right female breast: Secondary | ICD-10-CM

## 2021-10-30 DIAGNOSIS — R293 Abnormal posture: Secondary | ICD-10-CM | POA: Diagnosis not present

## 2021-10-30 DIAGNOSIS — M25511 Pain in right shoulder: Secondary | ICD-10-CM

## 2021-10-30 DIAGNOSIS — M62838 Other muscle spasm: Secondary | ICD-10-CM

## 2021-10-30 DIAGNOSIS — M25611 Stiffness of right shoulder, not elsewhere classified: Secondary | ICD-10-CM

## 2021-10-30 DIAGNOSIS — Z170 Estrogen receptor positive status [ER+]: Secondary | ICD-10-CM | POA: Insufficient documentation

## 2021-10-30 DIAGNOSIS — M79604 Pain in right leg: Secondary | ICD-10-CM

## 2021-10-30 DIAGNOSIS — M25621 Stiffness of right elbow, not elsewhere classified: Secondary | ICD-10-CM

## 2021-10-30 NOTE — Therapy (Signed)
[facility] @ [address] [place], [place], [address] Phone: [phone]   Fax:  [phone]  Physical Therapy Treatment  Patient Details  Name: Emily Montoya MRN: [id number] Date of Birth: May 21, 1971 Referring Provider (PT): Joaquin Courts Date: 10/30/2021   PT End of Session - 10/30/21 1058     Visit Number 2    Number of Visits 10    Date for PT Re-Evaluation 11/27/21    PT Start Time 1006    PT Stop Time 1050    PT Time Calculation (min) 44 min    Activity Tolerance Patient tolerated treatment well    Behavior During Therapy [facility] for tasks assessed/performed             Past Medical History:  Diagnosis Date   Breast cancer ([place]) 08/20/2021   Sickle cell trait ([place])     Past Surgical History:  Procedure Laterality Date   BREAST LUMPECTOMY WITH RADIOACTIVE SEED AND SENTINEL LYMPH NODE BIOPSY Right 10/11/2021   Procedure: RIGHT BREAST LUMPECTOMY WITH RADIOACTIVE SEED LOCALIZATION AND SENTINEL LYMPH NODE BIOPSY;  Surgeon: [name], MD;  Location: [facility];  Service: General;  Laterality: Right;   TUBAL LIGATION      There were no vitals filed for this visit.   Subjective Assessment - 10/30/21 1006     Subjective I started gabapentin yesterday. I have been holding my arm close to me like its in a sling.    Pertinent History R breast cancer ER+PR+, R breast lumpectomy and SLNB on 10/11/21    Patient Stated Goals to gain info from provider    Currently in Pain? Yes    Pain Score 6     Pain Location Arm    Pain Orientation Right    Pain Descriptors / Indicators Tightness    Pain Type Surgical pain    Pain Onset 1 to 4 weeks ago    Pain Frequency Constant    Aggravating Factors  touching    Pain Relieving Factors putting a pillow under arm    Effect of Pain on Daily Activities difficult to move arm                [facility] PT Assessment - 10/30/21 0001       Balance Screen    Has the patient fallen in the past 6 months No    Has the patient had a decrease in activity level because of a fear of falling?  No    Is the patient reluctant to leave their home because of a fear of falling?  No      AROM   Right Shoulder Flexion 61 Degrees    Right Shoulder ABduction 65 Degrees    Right Shoulder Internal Rotation --   unable to measure due to pain   Right Shoulder External Rotation --   unable to measure due to pain                          [facility] Adult PT Treatment/Exercise - 10/30/21 0001       Manual Therapy   Manual Therapy Passive ROM;Soft tissue mobilization    Soft tissue mobilization gently to R upper arm to help decrease guarding and to briefly to R pec with numerous areas of trigger points noted    Passive ROM attempted to R shoulder in to flexion and abduction and R elbow into  extension but pt extremely guarded and unable to relax to allow for PROM                          PT Long Term Goals - 10/30/21 1209       PT LONG TERM GOAL #1   Title Pt will demonstrate a return to baseline shoulder ROM and not demonstrate any signs or symptoms of lymphedema.    Time 4    Period Weeks    Status On-going      PT LONG TERM GOAL #2   Title Pt will demonstrate 160 degrees of R shoulder flexion to allow her to reach overhead.    Baseline 61    Time 4    Period Weeks    Status New    Target Date 11/27/21      PT LONG TERM GOAL #3   Title Pt will demonstrate 165 degrees of R shoulder abduction to allow her to reach out to the side.    Baseline 65    Time 4    Period Weeks    Status New    Target Date 11/27/21      PT LONG TERM GOAL #4   Title Pt will be able to stand without guarded positioning and with elbow extended.    Baseline Pt stands with elbow flexed to 120 and arm held across chest    Time 4    Period Weeks    Status New    Target Date 11/27/21      PT LONG TERM GOAL #5   Title Pt will report a 50%  improvement in pain throughout R upper quadrant to allow improved comfort    Time 4    Period Weeks    Status New    Target Date 11/27/21      Additional Long Term Goals   Additional Long Term Goals Yes      PT LONG TERM GOAL #6   Title Pt will be independent with self MLD to decrease swelling in R breast.    Time 4    Period Weeks    Status New    Target Date 11/27/21      PT LONG TERM GOAL #7   Title Pt will be independent in a home exercise program for continued stretching and strengthening    Time 4    Period Weeks    Status New    Target Date 11/27/21                   Plan - 10/30/21 1059     Clinical Impression Statement Pt returns to PT after underoing a R lumpectomy and SLNB (0/5) on 10/11/21. She will require radiation. Pt's ROM on the R side is extrememely limited. She reports after surgery she was unable to move her fingers on her R hand but that has improved. She recently saw her surgeon who prescribed her gabapentin and she just started taking this yesterday. She is very guarded and was unable to tolerate any PROM today. All of her scapular muscles and her pecs are tight with trigger points noted. Her elbow is bent to about 120 degrees and pt is unable to straighten it and she was unable to tolerate PROM to straighten it. Pt's surgeon told her she may need to hold off a little bit for PT until her pain is more controlled. She has extreme sensistivity in her axilla to touch  due to nerve sensitivity. Educated pt and sister about importance of nerve desensitization techniques. Pt also has some swelling present in her R breast and feels best with her compression bra on. Pt would benefit from skilled PT services to improve R shoulder and elbow ROM, decrease pain and muscle spasms, decrease R breast lymphedema and educated pt in a home exercise program for contiued stretching and strengthening.    Rehab Potential Good    PT Frequency 2x / week    PT Duration 4 weeks     PT Treatment/Interventions ADLs/Self Care Home Management;Patient/family education;Therapeutic exercise;Manual techniques;Manual lymph drainage;Compression bandaging;Orthotic Fit/Training;Scar mobilization;Passive range of motion;Taping;Vasopneumatic Device    PT Next Visit Plan gentle PROM to R shoulder and elbow, cording? give piriformis stretches, hamstring stretches, ITB stretches and see if that helps back pain (if pt is still having back pain), reassess baselines    PT Home Exercise Plan post op breast exercises    Consulted and Agree with Plan of Care Patient;Family member/caregiver    Family Member Consulted sister             Patient will benefit from skilled therapeutic intervention in order to improve the following deficits and impairments:  Pain, Postural dysfunction, Decreased knowledge of precautions, Impaired UE functional use, Impaired flexibility, Increased fascial restricitons, Decreased strength, Decreased range of motion, Decreased scar mobility, Increased edema  Visit Diagnosis: Stiffness of right shoulder, not elsewhere classified  Acute pain of right shoulder  Other muscle spasm  Stiffness of right elbow, not elsewhere classified  Pain in right leg  Abnormal posture  Malignant neoplasm of upper-inner quadrant of right breast in female, estrogen receptor positive [facility])     Problem List Patient Active Problem List   Diagnosis Date Noted   Genetic testing 09/23/2021   Malignant neoplasm of upper-inner quadrant of right breast in female, estrogen receptor positive ([place]) 08/29/2021    Allyson Sabal [place], PT 10/30/2021, 12:14 PM  [facility] @ [address] [place], [place], [address] Phone: [phone]   Fax:  [phone]  Name: MONTINA DORRANCE MRN: [id number] Date of Birth: 04-10-1971

## 2021-10-30 NOTE — Telephone Encounter (Signed)
Pt called asking when she can restart her Tamoxifen.  Pt stated she had her surgery on 10/11/2021.  Notified Dr. Burr Medico to further direct pt on when to restart Tamoxifen.  Spoke with Dr. Burr Medico regarding when to restart Tamoxifen.  Dr. Burr Medico stated that can restart as long as the pt has been cleared by the surgeon to restart everyday activities.

## 2021-11-07 ENCOUNTER — Ambulatory Visit: Payer: BC Managed Care – PPO | Admitting: Physical Therapy

## 2021-11-10 ENCOUNTER — Encounter: Payer: Self-pay | Admitting: Hematology

## 2021-11-11 ENCOUNTER — Emergency Department (HOSPITAL_COMMUNITY)
Admission: EM | Admit: 2021-11-11 | Discharge: 2021-11-11 | Disposition: A | Discharge status: Home / Self Care | Payer: BC Managed Care – PPO | Attending: Emergency Medicine | Admitting: Emergency Medicine

## 2021-11-11 ENCOUNTER — Encounter (HOSPITAL_COMMUNITY): Payer: Self-pay

## 2021-11-11 ENCOUNTER — Other Ambulatory Visit: Payer: Self-pay

## 2021-11-11 ENCOUNTER — Emergency Department (HOSPITAL_BASED_OUTPATIENT_CLINIC_OR_DEPARTMENT_OTHER): Payer: BC Managed Care – PPO

## 2021-11-11 ENCOUNTER — Emergency Department (HOSPITAL_COMMUNITY): Payer: BC Managed Care – PPO

## 2021-11-11 DIAGNOSIS — R200 Anesthesia of skin: Secondary | ICD-10-CM | POA: Diagnosis not present

## 2021-11-11 DIAGNOSIS — Z853 Personal history of malignant neoplasm of breast: Secondary | ICD-10-CM | POA: Insufficient documentation

## 2021-11-11 DIAGNOSIS — M5412 Radiculopathy, cervical region: Secondary | ICD-10-CM

## 2021-11-11 DIAGNOSIS — M79602 Pain in left arm: Secondary | ICD-10-CM

## 2021-11-11 DIAGNOSIS — M47812 Spondylosis without myelopathy or radiculopathy, cervical region: Secondary | ICD-10-CM | POA: Diagnosis not present

## 2021-11-11 DIAGNOSIS — M4602 Spinal enthesopathy, cervical region: Secondary | ICD-10-CM | POA: Diagnosis not present

## 2021-11-11 DIAGNOSIS — M2578 Osteophyte, vertebrae: Secondary | ICD-10-CM | POA: Diagnosis not present

## 2021-11-11 HISTORY — DX: Radiculopathy, cervical region: M54.12

## 2021-11-11 LAB — CBC WITH DIFFERENTIAL/PLATELET
Abs Immature Granulocytes: 0.02 10*3/uL (ref 0.00–0.07)
Basophils Absolute: 0 10*3/uL (ref 0.0–0.1)
Basophils Relative: 0 %
Eosinophils Absolute: 0.2 10*3/uL (ref 0.0–0.5)
Eosinophils Relative: 3 %
HCT: 36.7 % (ref 36.0–46.0)
Hemoglobin: 11.6 g/dL — ABNORMAL LOW (ref 12.0–15.0)
Immature Granulocytes: 0 %
Lymphocytes Relative: 35 %
Lymphs Abs: 2.3 10*3/uL (ref 0.7–4.0)
MCH: 26.1 pg (ref 26.0–34.0)
MCHC: 31.6 g/dL (ref 30.0–36.0)
MCV: 82.5 fL (ref 80.0–100.0)
Monocytes Absolute: 0.5 10*3/uL (ref 0.1–1.0)
Monocytes Relative: 7 %
Neutro Abs: 3.6 10*3/uL (ref 1.7–7.7)
Neutrophils Relative %: 55 %
Platelets: 344 10*3/uL (ref 150–400)
RBC: 4.45 MIL/uL (ref 3.87–5.11)
RDW: 13.6 % (ref 11.5–15.5)
WBC: 6.5 10*3/uL (ref 4.0–10.5)
nRBC: 0 % (ref 0.0–0.2)

## 2021-11-11 LAB — BASIC METABOLIC PANEL
Anion gap: 8 (ref 5–15)
BUN: 10 mg/dL (ref 6–20)
CO2: 22 mmol/L (ref 22–32)
Calcium: 9.2 mg/dL (ref 8.9–10.3)
Chloride: 111 mmol/L (ref 98–111)
Creatinine, Ser: 0.84 mg/dL (ref 0.44–1.00)
GFR, Estimated: >60 mL/min (ref >60)
Glucose, Bld: 94 mg/dL (ref 70–99)
Potassium: 3.8 mmol/L (ref 3.5–5.1)
Sodium: 141 mmol/L (ref 135–145)

## 2021-11-11 LAB — PROTIME-INR
INR: 1 (ref 0.8–1.2)
Prothrombin Time: 13.3 seconds (ref 11.4–15.2)

## 2021-11-11 LAB — APTT: aPTT: 27 seconds (ref 24–36)

## 2021-11-11 MED ORDER — PREDNISONE 50 MG PO TABS: 50.0000 mg | ORAL_TABLET | Freq: Every day | ORAL | 0 refills | Status: DC

## 2021-11-11 MED ORDER — TRAMADOL HCL 50 MG PO TABS: 50.0000 mg | ORAL_TABLET | Freq: Four times a day (QID) | ORAL | 0 refills | Status: DC | PRN

## 2021-11-11 MED ORDER — HYDROCODONE-ACETAMINOPHEN 5-325 MG PO TABS
1.0000 | ORAL_TABLET | Freq: Once | ORAL | Status: AC
Start: 2021-11-11 — End: 2021-11-11
  Administered 2021-11-11: 1 via ORAL
  Filled 2021-11-11: qty 1

## 2021-11-11 NOTE — Discharge Instructions (Signed)
Take the medications as prescribed.  Follow-up with a primary care doctor to be rechecked.  Return as needed for worsening symptoms`

## 2021-11-11 NOTE — ED Provider Notes (Signed)
[facility] EMERGENCY DEPARTMENT Provider Note   CSN: [id number] Arrival date & time: 11/11/21  1616     History  Chief Complaint  Patient presents with   Numbness    Emily Montoya is a 51 y.o. female.  HPI Does have a history of breast cancer.  She had surgery last month. Patient presents to the ED with complaints of left arm pain.  Patient states she has been having pain in the left arm and left neck for about a week.  Patient also is having tingling in that left arm.  She feels like her arm is asleep.  She denies any recent injuries.  She is not having any trouble with weakness.  No difficulty with speech or balance.  Home Medications Prior to Admission medications   Medication Sig Start Date End Date Taking? Authorizing Provider  predniSONE (DELTASONE) 50 MG tablet Take 1 tablet (50 mg total) by mouth daily. 11/11/21  Yes Dorie Rank, MD  methocarbamol (ROBAXIN) 500 MG tablet Take 500 mg by mouth 4 (four) times daily.    [provider]  tamoxifen (NOLVADEX) 20 MG tablet TAKE 1 TABLET(20 MG) BY MOUTH DAILY. STOP TAKING 1 WEEK BEFORE SURGERY 10/07/21   Truitt Merle, MD  traMADol (ULTRAM) 50 MG tablet Take 1 tablet (50 mg total) by mouth every 6 (six) hours as needed. 11/11/21 11/11/22  Dorie Rank, MD      Allergies    Meloxicam    Review of Systems   Review of Systems  All other systems reviewed and are negative.  Physical Exam Updated Vital Signs BP 123/78    Pulse 71    Temp 98.7 F (37.1 C) (Oral)    Resp 20    Ht 1.575 m (5\' 2" )    Wt 72.6 kg    SpO2 100%    BMI 29.26 kg/m  Physical Exam Vitals and nursing note reviewed.  Constitutional:      General: She is not in acute distress.    Appearance: She is well-developed.  HENT:     Head: Normocephalic and atraumatic.     Right Ear: External ear normal.     Left Ear: External ear normal.  Eyes:     General: No scleral icterus.       Right eye: No discharge.        Left eye: No  discharge.     Conjunctiva/sclera: Conjunctivae normal.  Neck:     Trachea: No tracheal deviation.     Comments: Tenderness palpation left paraspinal muscles, strong radial pulse, equal grip strength bilaterally, sensation intact left upper extremity although objectively altered compared to right Cardiovascular:     Rate and Rhythm: Normal rate and regular rhythm.  Pulmonary:     Effort: Pulmonary effort is normal. No respiratory distress.     Breath sounds: Normal breath sounds. No stridor. No wheezing or rales.  Abdominal:     General: Bowel sounds are normal. There is no distension.     Palpations: Abdomen is soft.     Tenderness: There is no abdominal tenderness. There is no guarding or rebound.  Musculoskeletal:        General: No tenderness or deformity.     Cervical back: Neck supple. No rigidity. Pain with movement and muscular tenderness present.  Skin:    General: Skin is warm and dry.     Findings: No rash.  Neurological:     General: No focal deficit present.  Mental Status: She is alert.     Cranial Nerves: No cranial nerve deficit (no facial droop, extraocular movements intact, no slurred speech).     Sensory: No sensory deficit.     Motor: No abnormal muscle tone or seizure activity.     Coordination: Coordination normal.  Psychiatric:        Mood and Affect: Mood normal.    ED Results / Procedures / Treatments   Labs (all labs ordered are listed, but only abnormal results are displayed) Labs Reviewed  CBC WITH DIFFERENTIAL/PLATELET - Abnormal; Notable for the following components:      Result Value   Hemoglobin 11.6 (*)    All other components within normal limits  BASIC METABOLIC PANEL  APTT  PROTIME-INR    EKG None  Radiology DG Cervical Spine Complete  Result Date: 11/11/2021 CLINICAL DATA:  LEFT arm pain. EXAM: CERVICAL SPINE - COMPLETE 4+ VIEW COMPARISON:  None. FINDINGS: There is normal alignment of the cervical spine. Disc height loss and  uncovertebral spurring identified at C5-6 and C6-7. No lytic or blastic lesions. Prevertebral soft tissues are unremarkable. No acute fracture. Lung apices are unremarkable. IMPRESSION: Mild degenerative changes.  No evidence for acute  abnormality. Electronically Signed   By: Nolon Nations M.D.   On: 11/11/2021 21:24   UE VENOUS DUPLEX (7am - 7pm)  Result Date: 11/11/2021 UPPER VENOUS STUDY  Patient Name:  KIMBALL MANSKE  Date of Exam:   11/11/2021 Medical Rec #: [id number]           Accession #:    [id number] Date of Birth: 1971-09-02            Patient Gender: F Patient Age:   33 years Exam Location:  [facility] Procedure:      VAS [name] UPPER EXTREMITY VENOUS DUPLEX Referring Phys: Norman --------------------------------------------------------------------------------  Indications: Pain Comparison Study: no prior Performing Technologist: Archie Patten RVS  Examination Guidelines: A complete evaluation includes B-mode imaging, spectral Doppler, color Doppler, and power Doppler as needed of all accessible portions of each vessel. Bilateral testing is considered an integral part of a complete examination. Limited examinations for reoccurring indications may be performed as noted.  Right Findings: +----------+------------+---------+-----------+----------+-------+  RIGHT      Compressible Phasicity Spontaneous Properties Summary  +----------+------------+---------+-----------+----------+-------+  Subclavian                 Yes        Yes                         +----------+------------+---------+-----------+----------+-------+  Left Findings: +----------+------------+---------+-----------+----------+-------+  LEFT       Compressible Phasicity Spontaneous Properties Summary  +----------+------------+---------+-----------+----------+-------+  IJV            Full        Yes        Yes                         +----------+------------+---------+-----------+----------+-------+  Subclavian     Full         Yes        Yes                         +----------+------------+---------+-----------+----------+-------+  Axillary       Full        Yes        Yes                         +----------+------------+---------+-----------+----------+-------+  Brachial       Full        Yes        Yes                         +----------+------------+---------+-----------+----------+-------+  Radial         Full                                               +----------+------------+---------+-----------+----------+-------+  Ulnar          Full                                               +----------+------------+---------+-----------+----------+-------+  Cephalic       Full                                               +----------+------------+---------+-----------+----------+-------+  Basilic        Full                                               +----------+------------+---------+-----------+----------+-------+  Summary:  Right: No evidence of thrombosis in the subclavian.  Left: No evidence of deep vein thrombosis in the upper extremity. No evidence of superficial vein thrombosis in the upper extremity.  *See table(s) above for measurements and observations.    Preliminary     Procedures Procedures    Medications Ordered in ED Medications  HYDROcodone-acetaminophen (NORCO/VICODIN) 5-325 MG per tablet 1 tablet (1 tablet Oral Given 11/11/21 2103)    ED Course/ Medical Decision Making/ A&P Clinical Course as of 11/11/21 2148  Mon Nov 11, 2021  2042 CBC normal.  Metabolic panel normal.  Coags normal. [JK]  2042 Doppler study without signs of DVT [JK]    Clinical Course User Index [JK] Dorie Rank, MD                           Medical Decision Making  Patient presented to the ED with complaints of left arm pain and numbness.  No signs of acute infection.  No signs of any vascular compromise.  No signs of DVT on her ultrasound.  Patient does have paraspinal muscle tenderness of the neck and symptoms are  suggestive of cervical radiculopathy.  Patient has good strength.  I do not feel that emergent MRI is necessary at this time.  I doubt stroke or TIA based on her arm pain and discomfort.  Will discharge home with medications for pain.  Discussed outpatient follow-up warning signs precautions.        Final Clinical Impression(s) / ED Diagnoses Final diagnoses:  Cervical radiculopathy    Rx / DC Orders ED Discharge Orders          Ordered    predniSONE (DELTASONE) 50 MG tablet  Daily        11/11/21 2148    traMADol (ULTRAM) 50 MG tablet  Every  6 hours PRN        11/11/21 2148              Dorie Rank, MD 11/11/21 2149

## 2021-11-11 NOTE — ED Triage Notes (Signed)
PT arrived POV c/o left arm numbness and tingling x 1 week. Pt had surgery related to breast cancer on 12/16 and they took lymph nodes out of her right arm. Pt also endorses CP when she takes a deep breath.

## 2021-11-11 NOTE — Progress Notes (Signed)
Upper extremity venous has been completed.   Preliminary results in CV Proc.   Emily Montoya 11/11/2021 7:30 PM

## 2021-11-11 NOTE — ED Notes (Signed)
Pt to xray

## 2021-11-11 NOTE — ED Provider Triage Note (Signed)
Emergency Medicine Provider Triage Evaluation Note  Emily Montoya , a 51 y.o. female  was evaluated in triage.  Pt complains of left arm numbness and tingling x1 week.  She has associated pain to the posterior left upper arm.  Patient had breast lumpectomy with sentinel lymph node biopsy on 10/11/2021.  Denies OCP, leg swelling.  Patient notes that she has been laying around a lot more since the surgery.  She currently takes tamoxifen.  Denies anticoagulant use.  Review of Systems  Positive: As per HPI above. Negative: Shortness of breath, fever, chills  Physical Exam  BP 122/70 (BP Location: Left Arm)    Pulse 64    Temp 98.5 F (36.9 C) (Oral)    Resp 18    Ht 5\' 2"  (1.575 m)    Wt 72.6 kg    SpO2 99%    BMI 29.26 kg/m  Gen:   Awake, no distress   Resp:  Normal effort  MSK:   Moves extremities without difficulty  Other:  Healing surgical site to right chest wall.  No tenderness to palpation to the left clavicle, shoulder.  Radial pulses intact bilaterally.  Mild tenderness to palpation to left posterior upper arm.  Medical Decision Making  Medically screening exam initiated at 6:31 PM.  Appropriate orders placed.  KYUNG MUTO was informed that the remainder of the evaluation will be completed by another provider, this initial triage assessment does not replace that evaluation, and the importance of remaining in the ED until their evaluation is complete.   Odysseus Cada A, PA-C 11/11/21 1850

## 2021-11-11 NOTE — ED Notes (Signed)
Patient verbalizes understanding of discharge instructions. Opportunity for questioning and answers were provided. Armband removed by staff, pt discharged from ED ambulatory.   

## 2021-11-12 ENCOUNTER — Ambulatory Visit: Payer: BC Managed Care – PPO | Admitting: Physical Therapy

## 2021-11-13 ENCOUNTER — Encounter: Payer: Self-pay | Admitting: *Deleted

## 2021-11-14 ENCOUNTER — Other Ambulatory Visit: Payer: Self-pay | Admitting: Hematology

## 2021-11-15 MED ORDER — TAMOXIFEN CITRATE 20 MG PO TABS: 20.0000 mg | ORAL_TABLET | Freq: Every day | ORAL | 1 refills | Status: DC

## 2021-11-18 ENCOUNTER — Encounter: Payer: Self-pay | Admitting: *Deleted

## 2021-11-18 ENCOUNTER — Telehealth: Payer: Self-pay | Admitting: *Deleted

## 2021-11-18 NOTE — Telephone Encounter (Signed)
Called and spoke with [facility] for an update on patient's oncotype results.  They are waiting on her insurance approval before they release the results.

## 2021-11-19 ENCOUNTER — Other Ambulatory Visit: Payer: Self-pay

## 2021-11-19 ENCOUNTER — Ambulatory Visit: Payer: BC Managed Care – PPO | Admitting: Physical Therapy

## 2021-11-19 ENCOUNTER — Encounter: Payer: Self-pay | Admitting: Physical Therapy

## 2021-11-19 DIAGNOSIS — M25611 Stiffness of right shoulder, not elsewhere classified: Secondary | ICD-10-CM | POA: Diagnosis not present

## 2021-11-19 DIAGNOSIS — C50211 Malignant neoplasm of upper-inner quadrant of right female breast: Secondary | ICD-10-CM

## 2021-11-19 DIAGNOSIS — Z170 Estrogen receptor positive status [ER+]: Secondary | ICD-10-CM | POA: Diagnosis not present

## 2021-11-19 DIAGNOSIS — M79604 Pain in right leg: Secondary | ICD-10-CM

## 2021-11-19 DIAGNOSIS — M25621 Stiffness of right elbow, not elsewhere classified: Secondary | ICD-10-CM

## 2021-11-19 DIAGNOSIS — M25511 Pain in right shoulder: Secondary | ICD-10-CM | POA: Diagnosis not present

## 2021-11-19 DIAGNOSIS — R293 Abnormal posture: Secondary | ICD-10-CM | POA: Diagnosis not present

## 2021-11-19 DIAGNOSIS — M62838 Other muscle spasm: Secondary | ICD-10-CM

## 2021-11-19 NOTE — Therapy (Signed)
[facility] @ [address] [place], [place], [address] Phone: [phone]   Fax:  [phone]  Physical Therapy Treatment  Patient Details  Name: LINNET BOTTARI MRN: [id number] Date of Birth: April 15, 1971 Referring Provider (PT): Joaquin Courts Date: 11/19/2021   PT End of Session - 11/19/21 1048     Visit Number 3    Number of Visits 10    Date for PT Re-Evaluation 11/27/21    PT Start Time 1008    PT Stop Time 1040   pt had to leave early due to having another appt   PT Time Calculation (min) 32 min    Activity Tolerance Patient limited by pain    Behavior During Therapy [facility] for tasks assessed/performed             Past Medical History:  Diagnosis Date   Breast cancer ([place]) 08/20/2021   Sickle cell trait ([place])     Past Surgical History:  Procedure Laterality Date   BREAST LUMPECTOMY WITH RADIOACTIVE SEED AND SENTINEL LYMPH NODE BIOPSY Right 10/11/2021   Procedure: RIGHT BREAST LUMPECTOMY WITH RADIOACTIVE SEED LOCALIZATION AND SENTINEL LYMPH NODE BIOPSY;  Surgeon: [name], MD;  Location: [facility];  Service: General;  Laterality: Right;   TUBAL LIGATION      There were no vitals filed for this visit.   Subjective Assessment - 11/19/21 1008     Subjective I think the ROM has increased. She has started gabapentin.    Pertinent History R breast cancer ER+PR+, R breast lumpectomy and SLNB on 10/11/21    Patient Stated Goals to gain info from provider    Currently in Pain? No/denies   reports L arm tingling   Pain Score 0-No pain                [facility] PT Assessment - 11/19/21 0001       AROM   Right Shoulder Flexion 106 Degrees    Right Shoulder ABduction 64 Degrees                           [facility] Adult PT Treatment/Exercise - 11/19/21 0001       Exercises   Exercises Other Exercises    Other Exercises  educated pt in [facility] for R elbow and pendulum for  R shoulder but pt unable to perform today due to increased pain and guarding      Manual Therapy   Passive ROM attempted to R shoulder in to flexion and abduction and R elbow into extension but pt extremely guarded and unable to relax to allow for PROM                          PT Long Term Goals - 10/30/21 1209       PT LONG TERM GOAL #1   Title Pt will demonstrate a return to baseline shoulder ROM and not demonstrate any signs or symptoms of lymphedema.    Time 4    Period Weeks    Status On-going      PT LONG TERM GOAL #2   Title Pt will demonstrate 160 degrees of R shoulder flexion to allow her to reach overhead.    Baseline 61    Time 4    Period Weeks    Status New    Target Date 11/27/21  PT LONG TERM GOAL #3   Title Pt will demonstrate 165 degrees of R shoulder abduction to allow her to reach out to the side.    Baseline 65    Time 4    Period Weeks    Status New    Target Date 11/27/21      PT LONG TERM GOAL #4   Title Pt will be able to stand without guarded positioning and with elbow extended.    Baseline Pt stands with elbow flexed to 120 and arm held across chest    Time 4    Period Weeks    Status New    Target Date 11/27/21      PT LONG TERM GOAL #5   Title Pt will report a 50% improvement in pain throughout R upper quadrant to allow improved comfort    Time 4    Period Weeks    Status New    Target Date 11/27/21      Additional Long Term Goals   Additional Long Term Goals Yes      PT LONG TERM GOAL #6   Title Pt will be independent with self MLD to decrease swelling in R breast.    Time 4    Period Weeks    Status New    Target Date 11/27/21      PT LONG TERM GOAL #7   Title Pt will be independent in a home exercise program for continued stretching and strengthening    Time 4    Period Weeks    Status New    Target Date 11/27/21                   Plan - 11/19/21 1048     Clinical Impression Statement  Reassessed pt's shoulder ROM today. Her flexion ROM has improved but her abduction ROM has remained the same. Pt has not been seen in two weeks. She had to cancel several appointments due to pain in her L arm that required an ER visit and R shoulder pain. She has cervical radiculopathy type pain in her LUE with numbness and tingling throughout her LUE. She also reports she has pain in area of R pec with deep breaths and yawns. This is most likely due to muscle guarding in the area. Pt is still holding her arm in a protected position. Her elbow is flexed and she is unable to straighten it. Therapist was also unable to straighten due to pt's increased pain and guarding. Educated pt in pendulum exercise but pt unable to perform due to pain and tightness. Pt has an appointment with her surgery following this appointment.    PT Frequency 2x / week    PT Duration 4 weeks    PT Treatment/Interventions ADLs/Self Care Home Management;Patient/family education;Therapeutic exercise;Manual techniques;Manual lymph drainage;Compression bandaging;Orthotic Fit/Training;Scar mobilization;Passive range of motion;Taping;Vasopneumatic Device    PT Next Visit Plan gentle PROM to R shoulder and elbow, cording, what did surgeon say at last appt    PT Home Exercise Plan post op breast exercises, pendulum    Consulted and Agree with Plan of Care Patient;Family member/caregiver    Family Member Consulted sister             Patient will benefit from skilled therapeutic intervention in order to improve the following deficits and impairments:  Pain, Postural dysfunction, Decreased knowledge of precautions, Impaired UE functional use, Impaired flexibility, Increased fascial restricitons, Decreased strength, Decreased range of motion, Decreased scar mobility, Increased  edema  Visit Diagnosis: Stiffness of right shoulder, not elsewhere classified  Acute pain of right shoulder  Other muscle spasm  Stiffness of right elbow,  not elsewhere classified  Pain in right leg  Abnormal posture  Malignant neoplasm of upper-inner quadrant of right breast in female, estrogen receptor positive [facility])     Problem List Patient Active Problem List   Diagnosis Date Noted   Genetic testing 09/23/2021   Malignant neoplasm of upper-inner quadrant of right breast in female, estrogen receptor positive ([place]) 08/29/2021    Allyson Sabal [place], PT 11/19/2021, 10:54 AM  [facility] @ [address] [place], [place], [address] Phone: [phone]   Fax:  [phone]  Name: MARIYANA FULOP MRN: [id number] Date of Birth: 1971/03/24

## 2021-11-21 ENCOUNTER — Other Ambulatory Visit: Payer: Self-pay

## 2021-11-21 ENCOUNTER — Ambulatory Visit: Payer: BC Managed Care – PPO | Admitting: Physical Therapy

## 2021-11-21 ENCOUNTER — Encounter: Payer: Self-pay | Admitting: Physical Therapy

## 2021-11-21 DIAGNOSIS — C50211 Malignant neoplasm of upper-inner quadrant of right female breast: Secondary | ICD-10-CM | POA: Diagnosis not present

## 2021-11-21 DIAGNOSIS — R293 Abnormal posture: Secondary | ICD-10-CM

## 2021-11-21 DIAGNOSIS — M25511 Pain in right shoulder: Secondary | ICD-10-CM

## 2021-11-21 DIAGNOSIS — M25611 Stiffness of right shoulder, not elsewhere classified: Secondary | ICD-10-CM

## 2021-11-21 DIAGNOSIS — M79604 Pain in right leg: Secondary | ICD-10-CM | POA: Diagnosis not present

## 2021-11-21 DIAGNOSIS — M62838 Other muscle spasm: Secondary | ICD-10-CM

## 2021-11-21 DIAGNOSIS — Z170 Estrogen receptor positive status [ER+]: Secondary | ICD-10-CM | POA: Diagnosis not present

## 2021-11-21 DIAGNOSIS — M25621 Stiffness of right elbow, not elsewhere classified: Secondary | ICD-10-CM

## 2021-11-21 NOTE — Therapy (Signed)
[facility] @ [place], [place], [address] Phone: [phone]   Fax:  [phone]  Physical Therapy Treatment  Patient Details  Name: Emily Montoya MRN: [id number] Date of Birth: 1971/07/13 Referring Provider (PT): Joaquin Courts Date: 11/21/2021   PT End of Session - 11/21/21 1057     Visit Number 4    Number of Visits 10    Date for PT Re-Evaluation 11/27/21    PT Start Time 1008    PT Stop Time 1054    PT Time Calculation (min) 46 min    Activity Tolerance Patient limited by pain    Behavior During Therapy [facility] for tasks assessed/performed             Past Medical History:  Diagnosis Date   Breast cancer ([place]) 08/20/2021   Sickle cell trait ([place])     Past Surgical History:  Procedure Laterality Date   BREAST LUMPECTOMY WITH RADIOACTIVE SEED AND SENTINEL LYMPH NODE BIOPSY Right 10/11/2021   Procedure: RIGHT BREAST LUMPECTOMY WITH RADIOACTIVE SEED LOCALIZATION AND SENTINEL LYMPH NODE BIOPSY;  Surgeon: [name], MD;  Location: [facility];  Service: General;  Laterality: Right;   TUBAL LIGATION      There were no vitals filed for this visit.   Subjective Assessment - 11/21/21 1008     Subjective I saw the doctor and he is referring me to an ortho dr and and a neurologist.    Pertinent History R breast cancer ER+PR+, R breast lumpectomy and SLNB on 10/11/21    Patient Stated Goals to gain info from provider    Currently in Pain? No/denies   still having numbness in LUE                              [facility] Adult PT Treatment/Exercise - 11/21/21 0001       Manual Therapy   Soft tissue mobilization to entire R upper arm including bicep and especially bicep tendon    Passive ROM focused on R elbow extension with prolonged holds in to extension to pt's tolerance, gained approx 40 degrees but pt unable to maintain this herself- increased pain throughout at  bicep tendon                          PT Long Term Goals - 10/30/21 1209       PT LONG TERM GOAL #1   Title Pt will demonstrate a return to baseline shoulder ROM and not demonstrate any signs or symptoms of lymphedema.    Time 4    Period Weeks    Status On-going      PT LONG TERM GOAL #2   Title Pt will demonstrate 160 degrees of R shoulder flexion to allow her to reach overhead.    Baseline 61    Time 4    Period Weeks    Status New    Target Date 11/27/21      PT LONG TERM GOAL #3   Title Pt will demonstrate 165 degrees of R shoulder abduction to allow her to reach out to the side.    Baseline 65    Time 4    Period Weeks    Status New    Target Date 11/27/21      PT LONG TERM GOAL #4   Title Pt will  be able to stand without guarded positioning and with elbow extended.    Baseline Pt stands with elbow flexed to 120 and arm held across chest    Time 4    Period Weeks    Status New    Target Date 11/27/21      PT LONG TERM GOAL #5   Title Pt will report a 50% improvement in pain throughout R upper quadrant to allow improved comfort    Time 4    Period Weeks    Status New    Target Date 11/27/21      Additional Long Term Goals   Additional Long Term Goals Yes      PT LONG TERM GOAL #6   Title Pt will be independent with self MLD to decrease swelling in R breast.    Time 4    Period Weeks    Status New    Target Date 11/27/21      PT LONG TERM GOAL #7   Title Pt will be independent in a home exercise program for continued stretching and strengthening    Time 4    Period Weeks    Status New    Target Date 11/27/21                   Plan - 11/21/21 1058     Clinical Impression Statement Pt reports she saw her surgeon and he placed referrals for her to be seen by neuro and ortho. She is waiting for them to call to schedule appointments. Focused this session on improving R elbow extension since pt keeps holding it at appoximately 90  degrees. was able to extend pt to nearly 120/130 degrees today but pt demonstrates increased muscle guarding and increased pain at bicep tendon. Unable to determine if she has developed a contracture or if she is just guarding. Pt needs frequent cues to relax in order to tolerate PROM. Educated pt to keep stretching at home and avoid holding elbow at 90 degrees. Added soft tissue mobilization throughout R upper arm to help decrease pain and to distract to allow increased extension at elbow.    PT Frequency 2x / week    PT Duration 4 weeks    PT Treatment/Interventions ADLs/Self Care Home Management;Patient/family education;Therapeutic exercise;Manual techniques;Manual lymph drainage;Compression bandaging;Orthotic Fit/Training;Scar mobilization;Passive range of motion;Taping;Vasopneumatic Device    PT Next Visit Plan gentle PROM to R shoulder and elbow, cording, what did surgeon say at last appt    PT Home Exercise Plan post op breast exercises, pendulum    Consulted and Agree with Plan of Care Patient             Patient will benefit from skilled therapeutic intervention in order to improve the following deficits and impairments:  Pain, Postural dysfunction, Decreased knowledge of precautions, Impaired UE functional use, Impaired flexibility, Increased fascial restricitons, Decreased strength, Decreased range of motion, Decreased scar mobility, Increased edema  Visit Diagnosis: Stiffness of right shoulder, not elsewhere classified  Acute pain of right shoulder  Other muscle spasm  Stiffness of right elbow, not elsewhere classified  Abnormal posture  Malignant neoplasm of upper-inner quadrant of right breast in female, estrogen receptor positive [facility])     Problem List Patient Active Problem List   Diagnosis Date Noted   Genetic testing 09/23/2021   Malignant neoplasm of upper-inner quadrant of right breast in female, estrogen receptor positive ([place]) 08/29/2021    Emily Montoya  Montoya Emily, PT 11/21/2021, 11:02 AM  [facility] @ [place], [place], [address] Phone: [phone]   Fax:  [phone]  Name: Emily Montoya MRN: [id number] Date of Birth: 1971/05/04

## 2021-11-22 ENCOUNTER — Telehealth: Payer: Self-pay | Admitting: *Deleted

## 2021-11-22 ENCOUNTER — Encounter: Payer: Self-pay | Admitting: *Deleted

## 2021-11-22 DIAGNOSIS — Z170 Estrogen receptor positive status [ER+]: Secondary | ICD-10-CM

## 2021-11-22 DIAGNOSIS — C50211 Malignant neoplasm of upper-inner quadrant of right female breast: Secondary | ICD-10-CM

## 2021-11-22 NOTE — Telephone Encounter (Signed)
Received oncotype results of 16/4%. Called patient and she is aware. Referral placed for Dr. Lisbeth Renshaw.

## 2021-11-25 ENCOUNTER — Encounter (HOSPITAL_COMMUNITY): Payer: Self-pay

## 2021-11-25 ENCOUNTER — Ambulatory Visit: Payer: BC Managed Care – PPO | Admitting: Physical Therapy

## 2021-11-26 ENCOUNTER — Encounter: Payer: Self-pay | Admitting: *Deleted

## 2021-11-27 ENCOUNTER — Ambulatory Visit: Payer: BC Managed Care – PPO | Attending: General Surgery | Admitting: Physical Therapy

## 2021-11-27 ENCOUNTER — Other Ambulatory Visit: Payer: Self-pay

## 2021-11-27 ENCOUNTER — Encounter: Payer: Self-pay | Admitting: Physical Therapy

## 2021-11-27 DIAGNOSIS — M25611 Stiffness of right shoulder, not elsewhere classified: Secondary | ICD-10-CM | POA: Insufficient documentation

## 2021-11-27 DIAGNOSIS — Z170 Estrogen receptor positive status [ER+]: Secondary | ICD-10-CM | POA: Diagnosis not present

## 2021-11-27 DIAGNOSIS — M62838 Other muscle spasm: Secondary | ICD-10-CM | POA: Insufficient documentation

## 2021-11-27 DIAGNOSIS — M25621 Stiffness of right elbow, not elsewhere classified: Secondary | ICD-10-CM | POA: Diagnosis not present

## 2021-11-27 DIAGNOSIS — C50211 Malignant neoplasm of upper-inner quadrant of right female breast: Secondary | ICD-10-CM | POA: Diagnosis not present

## 2021-11-27 DIAGNOSIS — R293 Abnormal posture: Secondary | ICD-10-CM | POA: Diagnosis not present

## 2021-11-27 DIAGNOSIS — M25511 Pain in right shoulder: Secondary | ICD-10-CM | POA: Insufficient documentation

## 2021-11-27 NOTE — Therapy (Signed)
[facility] @ [place] [place], [place], [address] Phone: [phone]   Fax:  [phone]  Physical Therapy Treatment  Patient Details  Name: CARLINE DURA MRN: [id number] Date of Birth: [birth year] Referring Provider (PT): Joaquin Courts Date: 11/27/2021   PT End of Session - 11/27/21 1452     Visit Number 5    Number of Visits 10    Date for PT Re-Evaluation 11/27/21    PT Start Time 1409    PT Stop Time 1454    PT Time Calculation (min) 45 min    Activity Tolerance Patient tolerated treatment well    Behavior During Therapy [facility] for tasks assessed/performed             Past Medical History:  Diagnosis Date   Breast cancer ([place]) 08/20/2021   Sickle cell trait ([place])     Past Surgical History:  Procedure Laterality Date   BREAST LUMPECTOMY WITH RADIOACTIVE SEED AND SENTINEL LYMPH NODE BIOPSY Right 10/11/2021   Procedure: RIGHT BREAST LUMPECTOMY WITH RADIOACTIVE SEED LOCALIZATION AND SENTINEL LYMPH NODE BIOPSY;  Surgeon: [name], MD;  Location: [facility];  Service: General;  Laterality: Right;   TUBAL LIGATION      There were no vitals filed for this visit.   Subjective Assessment - 11/27/21 1410     Subjective I feel like I am really doing good.    Pertinent History R breast cancer ER+PR+, R breast lumpectomy and SLNB on 10/11/21    Patient Stated Goals to gain info from provider    Currently in Pain? No/denies    Pain Score 0-No pain                               [facility] Adult PT Treatment/Exercise - 11/27/21 0001       Manual Therapy   Manual Therapy Scapular mobilization;Passive ROM;Neural Stretch    Scapular Mobilization in L s/l to R scapula in to protraction and retraction with barely any scapular movement to start but improved some but pt had difficulty relaxing    Passive ROM to R elbow in to extension and to R shoulder in to flexion and abduction  with good improvement noted today and pt able to relax with verbal cueing but still unable to completely extend elbow    Neural Stretch flexing and extending wrist in various degrees of elbow extension                          PT Long Term Goals - 10/30/21 1209       PT LONG TERM GOAL #1   Title Pt will demonstrate a return to baseline shoulder ROM and not demonstrate any signs or symptoms of lymphedema.    Time 4    Period Weeks    Status On-going      PT LONG TERM GOAL #2   Title Pt will demonstrate 160 degrees of R shoulder flexion to allow her to reach overhead.    Baseline 61    Time 4    Period Weeks    Status New    Target Date 11/27/21      PT LONG TERM GOAL #3   Title Pt will demonstrate 165 degrees of R shoulder abduction to allow her to reach out to the side.    Baseline 65  Time 4    Period Weeks    Status New    Target Date 11/27/21      PT LONG TERM GOAL #4   Title Pt will be able to stand without guarded positioning and with elbow extended.    Baseline Pt stands with elbow flexed to 120 and arm held across chest    Time 4    Period Weeks    Status New    Target Date 11/27/21      PT LONG TERM GOAL #5   Title Pt will report a 50% improvement in pain throughout R upper quadrant to allow improved comfort    Time 4    Period Weeks    Status New    Target Date 11/27/21      Additional Long Term Goals   Additional Long Term Goals Yes      PT LONG TERM GOAL #6   Title Pt will be independent with self MLD to decrease swelling in R breast.    Time 4    Period Weeks    Status New    Target Date 11/27/21      PT LONG TERM GOAL #7   Title Pt will be independent in a home exercise program for continued stretching and strengthening    Time 4    Period Weeks    Status New    Target Date 11/27/21                   Plan - 11/27/21 1453     Clinical Impression Statement Pt returned today and demonstrated a little more AROM in  to elbow extension. Pt was able to tolerate PROM in to elbow extension and shoulder flexion and abduction. She was able to relax more today so she made gains with PROM. She was about 25 degrees from full extension and was able to flex her shoulder high enough to reach her head. She continues to have increased pain throughout R upper arm and axilla that she describes as a burning pain. This is most likely nerve pain so educated pt on flexing and extending wrist at various degrees of elbow extension for nerve flossing to help decrease nerve pain. By end of session pt did not demonstrate nerve pain until further in to AROM or PROM.    PT Frequency 2x / week    PT Duration 4 weeks    PT Treatment/Interventions ADLs/Self Care Home Management;Patient/family education;Therapeutic exercise;Manual techniques;Manual lymph drainage;Compression bandaging;Orthotic Fit/Training;Scar mobilization;Passive range of motion;Taping;Vasopneumatic Device    PT Next Visit Plan gentle PROM to R shoulder and elbow, cording,    PT Home Exercise Plan post op breast exercises, pendulum    Consulted and Agree with Plan of Care Patient    Family Member Consulted sister             Patient will benefit from skilled therapeutic intervention in order to improve the following deficits and impairments:  Pain, Postural dysfunction, Decreased knowledge of precautions, Impaired UE functional use, Impaired flexibility, Increased fascial restricitons, Decreased strength, Decreased range of motion, Decreased scar mobility, Increased edema  Visit Diagnosis: Stiffness of right shoulder, not elsewhere classified  Acute pain of right shoulder  Other muscle spasm  Stiffness of right elbow, not elsewhere classified  Abnormal posture  Malignant neoplasm of upper-inner quadrant of right breast in female, estrogen receptor positive ([place])     Problem List Patient Active Problem List   Diagnosis Date Noted   Genetic testing  09/23/2021   Malignant neoplasm of upper-inner quadrant of right breast in female, estrogen receptor positive [facility]) 08/29/2021    Allyson Sabal [place], PT 11/27/2021, 2:59 PM  [facility] @ [place], [place], [address] Phone: [phone]   Fax:  [phone]  Name: SHELIA KINGSBERRY MRN: [id number] Date of Birth: [birth year]

## 2021-12-02 ENCOUNTER — Ambulatory Visit: Payer: BC Managed Care – PPO | Admitting: Orthopaedic Surgery

## 2021-12-02 ENCOUNTER — Encounter: Payer: Self-pay | Admitting: Orthopaedic Surgery

## 2021-12-02 ENCOUNTER — Other Ambulatory Visit: Payer: Self-pay

## 2021-12-02 DIAGNOSIS — M25511 Pain in right shoulder: Secondary | ICD-10-CM

## 2021-12-02 MED ORDER — TIZANIDINE HCL 4 MG PO TABS: 4.0000 mg | ORAL_TABLET | Freq: Three times a day (TID) | ORAL | 1 refills | Status: DC | PRN

## 2021-12-02 MED ORDER — LIDOCAINE HCL 1 % IJ SOLN
3.0000 mL | INTRAMUSCULAR | Status: AC | PRN
  Administered 2021-12-02: 3 mL

## 2021-12-02 MED ORDER — METHYLPREDNISOLONE ACETATE 40 MG/ML IJ SUSP
40.0000 mg | INTRAMUSCULAR | Status: AC | PRN
  Administered 2021-12-02: 40 mg via INTRA_ARTICULAR

## 2021-12-02 NOTE — Progress Notes (Signed)
Office Visit Note   Patient: Emily Montoya           Date of Birth: [birth year]           MRN: [id number] Visit Date: 12/02/2021              Requested by: Wenda Low, MD [address] [place],  [place] [address] PCP: Wenda Low, MD   Assessment & Plan: Visit Diagnoses:  1. Acute pain of right shoulder     Plan: 3 weeks ago she was already on a oral steroid.  I recommended a subacromial steroid injection today in the right shoulder and she agreed to this and tolerated well.  She is on methocarbamol as a muscle relaxant but I do think she needs something stronger so we will send in some Zanaflex.  All questions and concerns were answered and addressed.  She will continue therapy.  I will see her back on as-needed basis but if she is not making any progress after a week or 2 we would want to set her up for an intra-articular steroid injection under fluoroscopy or ultrasound of the right shoulder joint.  They will let us know.  Follow-Up Instructions: Return if symptoms worsen or fail to improve.   Orders:  Orders Placed This Encounter  Procedures   Large Joint Inj   Meds ordered this encounter  Medications   tiZANidine (ZANAFLEX) 4 MG tablet    Sig: Take 1 tablet (4 mg total) by mouth every 8 (eight) hours as needed for muscle spasms.    Dispense:  40 tablet    Refill:  1      Procedures: Large Joint Inj: R subacromial bursa on 12/02/2021 10:09 AM Indications: pain and diagnostic evaluation Details: 22 G 1.5 in needle  Arthrogram: No  Medications: 3 mL lidocaine 1 %; 40 mg methylPREDNISolone acetate 40 MG/ML Outcome: tolerated well, no immediate complications Procedure, treatment alternatives, risks and benefits explained, specific risks discussed. Consent was given by the patient. Immediately prior to procedure a time out was called to verify the correct patient, procedure, equipment, support staff and site/side marked as required. Patient was prepped  and draped in the usual sterile fashion.      Clinical Data: No additional findings.   Subjective: Chief Complaint  Patient presents with   Right Arm - Pain  The patient is a very pleasant 51 year old female sent from Dr. Marlou Starks in general surgery to evaluate right shoulder and arm stiffness.  She is actually going to physical therapy now and the notes of physical therapy stated that she is making progress with her scapular and shoulder motion on the right side as well as her elbow flexion and extension.  She has a history of breast cancer and had surgery on the right side.  She has radiation therapy coming up with treatments and needs to be able to have her right arm and left arm extended above her head.  She has some neck and radicular issues on the left side and she actually has an appointment with neurosurgery in the near future.  HPI  Review of Systems She is not a diabetic.  She denies any fever, chills, nausea, vomiting  Objective: Vital Signs: There were no vitals taken for this visit.  Physical Exam She is alert and orient x3 and in no acute distress Ortho Exam Examination of her right shoulder shows stiffness with the right shoulder and the right elbow on exam.  There is  no blocks to rotation and some of this is related to just pain.  There may be a touch of arthrofibrosis deep in the glenohumeral joint. Specialty Comments:  No specialty comments available.  Imaging: No results found.   PMFS History: Patient Active Problem List   Diagnosis Date Noted   Genetic testing 09/23/2021   Malignant neoplasm of upper-inner quadrant of right breast in female, estrogen receptor positive ([place]) 08/29/2021   Past Medical History:  Diagnosis Date   Breast cancer ([place]) 08/20/2021   Sickle cell trait ([place])     Family History  Problem Relation Age of Onset   HIV/AIDS Mother     Past Surgical History:  Procedure Laterality Date   BREAST LUMPECTOMY WITH RADIOACTIVE SEED AND  SENTINEL LYMPH NODE BIOPSY Right 10/11/2021   Procedure: RIGHT BREAST LUMPECTOMY WITH RADIOACTIVE SEED LOCALIZATION AND SENTINEL LYMPH NODE BIOPSY;  Surgeon: [name], MD;  Location: [facility];  Service: General;  Laterality: Right;   TUBAL LIGATION     Social History   Occupational History   Not on file  Tobacco Use   Smoking status: Former    Packs/day: 0.25    Years: 30.00    Pack years: 7.50    Types: Cigarettes    Quit date: 08/22/2021    Years since quitting: 0.2   Smokeless tobacco: Never  Vaping Use   Vaping Use: Never used  Substance and Sexual Activity   Alcohol use: Yes    Comment: Occasional Beers   Drug use: Never   Sexual activity: Not on file    Comment: s/p tubal ligation

## 2021-12-02 NOTE — Progress Notes (Signed)
°Radiation Oncology         [phone] °________________________________ ° ° °Name: Emily Montoya        MRN: [id number]  °Date of Service: 12/04/2021 DOB: 09/14/1971 ° °CC:Husain, Karrar, MD  Feng, Yan, MD    ° °REFERRING PHYSICIAN: Feng, Yan, MD ° ° °DIAGNOSIS: The encounter diagnosis was Malignant neoplasm of upper-inner quadrant of right breast in female, estrogen receptor positive (HCC). ° ° °HISTORY OF PRESENT ILLNESS: Emily Montoya is a 51 y.o. female with right breast cancer. The patient was noted to have a   palpable mass of the right diagnostic imaging identified a mass in the 1 o'clock position of the right breast measuring up to 1.2 cm in greatest dimension, no evidence of axillary adenopathy was noted.  She underwent a biopsy on 08/20/2021 that showed a grade 2 invasive ductal carcinoma that was ER/PR positive, HER2 negative with a Ki 67 of 15%.  ° °She proceeded with right lumpectomy and sentinel lymph node biopsy with Dr. 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  Her margins were negative for invasive and in situ disease.  She had an Oncotype score of 16 and no systemic chemotherapy is needed.  She is seen to discuss adjuvant radiotherapy.  Of note since surgery she has been having difficulty with her right and left shoulder and has seen Dr. Blackman in orthopedics. ° ° °PREVIOUS RADIATION THERAPY: No ° ° °PAST MEDICAL HISTORY:  °Past Medical History:  °Diagnosis Date  ° Breast cancer (HCC) 08/20/2021  ° Sickle cell trait (HCC)   °   ° ° °PAST SURGICAL HISTORY: °Past Surgical History:  °Procedure Laterality Date  ° BREAST LUMPECTOMY WITH RADIOACTIVE SEED AND SENTINEL LYMPH NODE BIOPSY Right 10/11/2021  ° Procedure: RIGHT BREAST LUMPECTOMY WITH RADIOACTIVE SEED LOCALIZATION AND SENTINEL LYMPH NODE BIOPSY;  Surgeon: Toth, Paul III, MD;   Location: [facility];  Service: General;  Laterality: Right;  ° TUBAL LIGATION    ° ° ° °FAMILY HISTORY:  °Family History  °Problem Relation Age of Onset  ° HIV/AIDS Mother   ° ° ° °SOCIAL HISTORY:  reports that she quit smoking about 3 months ago. Her smoking use included cigarettes. She has a 7.50 pack-year smoking history. She has never used smokeless tobacco. She reports current alcohol use. She reports that she does not use drugs. The patient is single  and lives in [place]. She works for [employer] remotely from home. She's currently on FLMA until completing treatment. She has three children ages 19-32.  ° ° °ALLERGIES: Meloxicam ° ° °MEDICATIONS:  °Current Outpatient Medications  °Medication Sig Dispense Refill  ° methocarbamol (ROBAXIN) 500 MG tablet Take 500 mg by mouth 4 (four) times daily.    ° predniSONE (DELTASONE) 50 MG tablet Take 1 tablet (50 mg total) by mouth daily. 5 tablet 0  ° tamoxifen (NOLVADEX) 20 MG tablet Take 1 tablet (20 mg total) by mouth daily. 30 tablet 1  ° tiZANidine (ZANAFLEX) 4 MG tablet Take 1 tablet (4 mg total) by mouth every 8 (eight) hours as needed for muscle spasms. 40 tablet 1  ° traMADol (ULTRAM) 50 MG tablet Take 1 tablet (50 mg total) by mouth every 6 (six) hours as needed. 12 tablet 0  ° °No current facility-administered medications for this visit.  ° ° ° °  REVIEW OF SYSTEMS: On review of systems, the patient reports that she is doing well overall and her shoulders are less painful since a steroid injection and PT. No specific complaints are otherwise noted.      PHYSICAL EXAM:  Wt Readings from Last 3 Encounters:  11/11/21 160 lb (72.6 kg)  10/11/21 161 lb 9.6 oz (73.3 kg)  09/02/21 162 lb 4.8 oz (73.6 kg)   Temp Readings from Last 3 Encounters:  11/11/21 98.2 F (36.8 C) (Oral)  10/11/21 98.2 F (36.8 C)  09/02/21 99.4 F (37.4 C) (Oral)   BP Readings from Last 3 Encounters:  11/11/21 103/68  10/11/21 113/73   09/02/21 117/71   Pulse Readings from Last 3 Encounters:  11/11/21 63  10/11/21 66  09/02/21 63    In general this is a well appearing African-American female in no acute distress.  She's alert and oriented x4 and appropriate throughout the examination. Cardiopulmonary assessment is negative for acute distress and she exhibits normal effort.  Her right breast has a well healed lumpectomy site without erythema separation or drainage.what appears to be a third nipple is noted lateral and inferior to the surgical site. She does have mild induration deep to her incision.   ECOG = 0  0 - Asymptomatic (Fully active, able to carry on all predisease activities without restriction)  1 - Symptomatic but completely ambulatory (Restricted in physically strenuous activity but ambulatory and able to carry out work of a light or sedentary nature. For example, light housework, office work)  2 - Symptomatic, <50% in bed during the day (Ambulatory and capable of all self care but unable to carry out any work activities. Up and about more than 50% of waking hours)  3 - Symptomatic, >50% in bed, but not bedbound (Capable of only limited self-care, confined to bed or chair 50% or more of waking hours)  4 - Bedbound (Completely disabled. Cannot carry on any self-care. Totally confined to bed or chair)  5 - Death   Eustace Pen MM, Creech RH, Tormey DC, et al. [phone]). "Toxicity and response criteria of the [facility] Group". [place] Oncol. 5 (6): 649-55    LABORATORY DATA:  Lab Results  Component Value Date   WBC 6.5 11/11/2021   HGB 11.6 (L) 11/11/2021   HCT 36.7 11/11/2021   MCV 82.5 11/11/2021   PLT 344 11/11/2021   Lab Results  Component Value Date   NA 141 11/11/2021   K 3.8 11/11/2021   CL 111 11/11/2021   CO2 22 11/11/2021   Lab Results  Component Value Date   ALT 15 06/14/2019   AST 22 06/14/2019   ALKPHOS 53 06/14/2019   BILITOT 0.6 06/14/2019      RADIOGRAPHY:  DG Cervical Spine Complete  Result Date: 11/11/2021 CLINICAL DATA:  LEFT arm pain. EXAM: CERVICAL SPINE - COMPLETE 4+ VIEW COMPARISON:  None. FINDINGS: There is normal alignment of the cervical spine. Disc height loss and uncovertebral spurring identified at C5-6 and C6-7. No lytic or blastic lesions. Prevertebral soft tissues are unremarkable. No acute fracture. Lung apices are unremarkable. IMPRESSION: Mild degenerative changes.  No evidence for acute  abnormality. Electronically Signed   By: Nolon Nations M.D.   On: 11/11/2021 21:24   UE VENOUS DUPLEX (7am - 7pm)  Result Date: 11/12/2021 UPPER VENOUS STUDY  Patient Name:  SABRIYAH WILCHER  Date of Exam:   11/11/2021 Medical Rec #: [id number]  Accession #:    [id number] Date of Birth: 05/30/1971            Patient Gender: F Patient Age:   50 years Exam Location:  [facility] Procedure:      VAS US UPPER EXTREMITY VENOUS DUPLEX Referring Phys: SOIJETT BLUE --------------------------------------------------------------------------------  Indications: Pain Comparison Study: no prior Performing Technologist: Megan Stricklin RVS  Examination Guidelines: A complete evaluation includes B-mode imaging, spectral Doppler, color Doppler, and power Doppler as needed of all accessible portions of each vessel. Bilateral testing is considered an integral part of a complete examination. Limited examinations for reoccurring indications may be performed as noted.  Right Findings: +----------+------------+---------+-----------+----------+-------+  RIGHT      Compressible Phasicity Spontaneous Properties Summary  +----------+------------+---------+-----------+----------+-------+  Subclavian                 Yes        Yes                         +----------+------------+---------+-----------+----------+-------+  Left Findings: +----------+------------+---------+-----------+----------+-------+  LEFT       Compressible Phasicity Spontaneous Properties Summary   +----------+------------+---------+-----------+----------+-------+  IJV            Full        Yes        Yes                         +----------+------------+---------+-----------+----------+-------+  Subclavian     Full        Yes        Yes                         +----------+------------+---------+-----------+----------+-------+  Axillary       Full        Yes        Yes                         +----------+------------+---------+-----------+----------+-------+  Brachial       Full        Yes        Yes                         +----------+------------+---------+-----------+----------+-------+  Radial         Full                                               +----------+------------+---------+-----------+----------+-------+  Ulnar          Full                                               +----------+------------+---------+-----------+----------+-------+  Cephalic       Full                                               +----------+------------+---------+-----------+----------+-------+  Basilic        Full                                               +----------+------------+---------+-----------+----------+-------+    Summary:  Right: No evidence of thrombosis in the subclavian.  Left: No evidence of deep vein thrombosis in the upper extremity. No evidence of superficial vein thrombosis in the upper extremity.  *See table(s) above for measurements and observations.  Diagnosing physician: Deitra Mayo MD Electronically signed by Deitra Mayo MD on 11/12/2021 at 3:29:26 PM.    Final        IMPRESSION/PLAN: 1. Stage IA, [id number] grade 2, ER/PR positive invasive ductal carcinoma of the right breast. We discussed the final pathology findings and reviews the nature of early stage breast disease.  Dr. Lisbeth Renshaw recommends adjuvant external radiotherapy to the breast  to reduce risks of local recurrence followed by antiestrogen therapy. I discussed the risks, benefits, short, and long term effects of  radiotherapy, as well as the curative intent, and the patient is interested in proceeding. We discusses the delivery and logistics of radiotherapy and Dr. Lisbeth Renshaw has recommended 4 or 6 1/2 weeks of radiotherapy to the right breast. After discussion regarding each option, she opts to proceed with 4 weeks of therapy to the right breast. Written consent is obtained and placed in the chart, a copy was provided to the patient. We will simulate today. 2. Contraceptive Counseling. The patient is having cycles but has had a tubal ligation after her last pregnancy 19 years ago. No pregnancy testing is needed prior to proceeding.     In a visit lasting 45 minutes, greater than 50% of the time was spent face to face discussing the patient's condition, in preparation for the discussion, and coordinating the patient's care.      Carola Rhine, [facility]    **Disclaimer: This note was dictated with voice recognition software. Similar sounding words can inadvertently be transcribed and this note may contain transcription errors which may not have been corrected upon publication of note.**

## 2021-12-03 ENCOUNTER — Telehealth: Payer: Self-pay

## 2021-12-03 NOTE — Telephone Encounter (Signed)
Spoke w/ patient, verified identity and reminded patient of her 9:00am-12/04/21 in-person appointment w/ Shona Simpson PA-C. I advised patient to arrive 33min early for check-in. I also inquired about patient's current pregnancy status. Patient states "No chances of pregnancy." I told patient that if anything changes, that she may take an at-home pregnancy test and deliver the results to [name]. I left my extension [phone] in case patient needs to call. Patient verbalized understanding of information.

## 2021-12-04 ENCOUNTER — Encounter: Payer: Self-pay | Admitting: Radiation Oncology

## 2021-12-04 ENCOUNTER — Ambulatory Visit
Admission: RE | Admit: 2021-12-04 | Discharge: 2021-12-04 | Disposition: A | Discharge status: Home / Self Care | Payer: BC Managed Care – PPO | Source: Ambulatory Visit | Attending: Radiation Oncology | Admitting: Radiation Oncology

## 2021-12-04 ENCOUNTER — Other Ambulatory Visit: Payer: Self-pay

## 2021-12-04 VITALS — BP 116/73 | HR 62 | Temp 97.2°F | Resp 18 | Ht 62.0 in | Wt 160.2 lb

## 2021-12-04 DIAGNOSIS — D573 Sickle-cell trait: Secondary | ICD-10-CM | POA: Insufficient documentation

## 2021-12-04 DIAGNOSIS — Z170 Estrogen receptor positive status [ER+]: Secondary | ICD-10-CM | POA: Insufficient documentation

## 2021-12-04 DIAGNOSIS — Z7952 Long term (current) use of systemic steroids: Secondary | ICD-10-CM | POA: Diagnosis not present

## 2021-12-04 DIAGNOSIS — Z87891 Personal history of nicotine dependence: Secondary | ICD-10-CM | POA: Insufficient documentation

## 2021-12-04 DIAGNOSIS — Z510 Encounter for antineoplastic radiation therapy: Secondary | ICD-10-CM | POA: Insufficient documentation

## 2021-12-04 DIAGNOSIS — C50211 Malignant neoplasm of upper-inner quadrant of right female breast: Secondary | ICD-10-CM

## 2021-12-04 DIAGNOSIS — M79602 Pain in left arm: Secondary | ICD-10-CM | POA: Insufficient documentation

## 2021-12-04 NOTE — Progress Notes (Addendum)
Spoke w/ patient, verified identity, and begin nursing evualuation, w/ patient's sister "Arcola Jansky" in attendance. Patient states "Bilateral arm discomfort, w/ limited range of motion. RT arm is doing somewhat okay, but the LT arm is much worse, with a pain score of 7/10." Patient is currently in physical therapy. No other symptoms reported at this time.  Meaningful use complete. Patient states "Tubal Ligation" w/ NO chances of pregnancy.  BP 116/73 (BP Location: Left Arm, Patient Position: Sitting, Cuff Size: Normal)    Pulse 62    Temp (!) 97.2 F (36.2 C) (Temporal)    Resp 18    Ht 5\' 2"  (1.575 m)    Wt 160 lb 4 oz (72.7 kg)    SpO2 100%    BMI 29.31 kg/m

## 2021-12-10 ENCOUNTER — Ambulatory Visit: Payer: BC Managed Care – PPO | Admitting: Physical Therapy

## 2021-12-10 ENCOUNTER — Encounter: Payer: Self-pay | Admitting: *Deleted

## 2021-12-10 DIAGNOSIS — C50211 Malignant neoplasm of upper-inner quadrant of right female breast: Secondary | ICD-10-CM | POA: Diagnosis not present

## 2021-12-10 DIAGNOSIS — Z510 Encounter for antineoplastic radiation therapy: Secondary | ICD-10-CM | POA: Diagnosis not present

## 2021-12-10 DIAGNOSIS — Z170 Estrogen receptor positive status [ER+]: Secondary | ICD-10-CM | POA: Diagnosis not present

## 2021-12-12 ENCOUNTER — Other Ambulatory Visit: Payer: Self-pay

## 2021-12-12 ENCOUNTER — Ambulatory Visit
Admission: RE | Admit: 2021-12-12 | Discharge: 2021-12-12 | Disposition: A | Discharge status: Home / Self Care | Payer: BC Managed Care – PPO | Source: Ambulatory Visit | Attending: Radiation Oncology | Admitting: Radiation Oncology

## 2021-12-12 ENCOUNTER — Ambulatory Visit: Payer: BC Managed Care – PPO | Admitting: Physical Therapy

## 2021-12-12 DIAGNOSIS — Z510 Encounter for antineoplastic radiation therapy: Secondary | ICD-10-CM | POA: Diagnosis not present

## 2021-12-12 DIAGNOSIS — Z170 Estrogen receptor positive status [ER+]: Secondary | ICD-10-CM | POA: Diagnosis not present

## 2021-12-12 DIAGNOSIS — C50211 Malignant neoplasm of upper-inner quadrant of right female breast: Secondary | ICD-10-CM | POA: Diagnosis not present

## 2021-12-12 MED ORDER — SONAFINE EX EMUL
1.0000 "application " | Freq: Once | CUTANEOUS | Status: AC
  Administered 2021-12-12: 1 via TOPICAL

## 2021-12-12 NOTE — Progress Notes (Signed)
Pt here for patient teaching.  Pt given Radiation and You booklet, skin care instructions, and Sonafine.  Reviewed areas of pertinence such as fatigue, hair loss, skin changes, breast tenderness, and breast swelling . Pt able to give teach back of to pat skin and use unscented/gentle soap,apply Sonafine bid, avoid applying anything to skin within 4 hours of treatment, avoid wearing an under wire bra, and to use an electric razor if they must shave. Pt verbalizes understanding of information given and will contact nursing with any questions or concerns.    Gloriajean Dell. Leonie Green, BSN

## 2021-12-13 ENCOUNTER — Telehealth: Payer: Self-pay | Admitting: Hematology

## 2021-12-13 ENCOUNTER — Ambulatory Visit
Admission: RE | Admit: 2021-12-13 | Discharge: 2021-12-13 | Disposition: A | Discharge status: Home / Self Care | Payer: BC Managed Care – PPO | Source: Ambulatory Visit | Attending: Radiation Oncology | Admitting: Radiation Oncology

## 2021-12-13 DIAGNOSIS — Z170 Estrogen receptor positive status [ER+]: Secondary | ICD-10-CM | POA: Diagnosis not present

## 2021-12-13 DIAGNOSIS — Z510 Encounter for antineoplastic radiation therapy: Secondary | ICD-10-CM | POA: Diagnosis not present

## 2021-12-13 DIAGNOSIS — C50211 Malignant neoplasm of upper-inner quadrant of right female breast: Secondary | ICD-10-CM | POA: Diagnosis not present

## 2021-12-13 NOTE — Telephone Encounter (Signed)
Sch per 2/16 inbasket, pt aware

## 2021-12-16 ENCOUNTER — Ambulatory Visit: Payer: BC Managed Care – PPO

## 2021-12-17 ENCOUNTER — Ambulatory Visit
Admission: RE | Admit: 2021-12-17 | Discharge: 2021-12-17 | Disposition: A | Discharge status: Home / Self Care | Payer: BC Managed Care – PPO | Source: Ambulatory Visit | Attending: Radiation Oncology | Admitting: Radiation Oncology

## 2021-12-17 ENCOUNTER — Other Ambulatory Visit: Payer: Self-pay

## 2021-12-17 DIAGNOSIS — C50211 Malignant neoplasm of upper-inner quadrant of right female breast: Secondary | ICD-10-CM | POA: Diagnosis not present

## 2021-12-17 DIAGNOSIS — Z510 Encounter for antineoplastic radiation therapy: Secondary | ICD-10-CM | POA: Diagnosis not present

## 2021-12-17 DIAGNOSIS — Z170 Estrogen receptor positive status [ER+]: Secondary | ICD-10-CM | POA: Diagnosis not present

## 2021-12-18 ENCOUNTER — Ambulatory Visit
Admission: RE | Admit: 2021-12-18 | Discharge: 2021-12-18 | Disposition: A | Discharge status: Home / Self Care | Payer: BC Managed Care – PPO | Source: Ambulatory Visit | Attending: Radiation Oncology | Admitting: Radiation Oncology

## 2021-12-18 DIAGNOSIS — Z170 Estrogen receptor positive status [ER+]: Secondary | ICD-10-CM | POA: Diagnosis not present

## 2021-12-18 DIAGNOSIS — Z510 Encounter for antineoplastic radiation therapy: Secondary | ICD-10-CM | POA: Diagnosis not present

## 2021-12-18 DIAGNOSIS — C50211 Malignant neoplasm of upper-inner quadrant of right female breast: Secondary | ICD-10-CM | POA: Diagnosis not present

## 2021-12-19 ENCOUNTER — Other Ambulatory Visit: Payer: Self-pay

## 2021-12-19 ENCOUNTER — Ambulatory Visit
Admission: RE | Admit: 2021-12-19 | Discharge: 2021-12-19 | Disposition: A | Discharge status: Home / Self Care | Payer: BC Managed Care – PPO | Source: Ambulatory Visit | Attending: Radiation Oncology | Admitting: Radiation Oncology

## 2021-12-19 DIAGNOSIS — Z510 Encounter for antineoplastic radiation therapy: Secondary | ICD-10-CM | POA: Diagnosis not present

## 2021-12-19 DIAGNOSIS — M5412 Radiculopathy, cervical region: Secondary | ICD-10-CM | POA: Diagnosis not present

## 2021-12-19 DIAGNOSIS — Z170 Estrogen receptor positive status [ER+]: Secondary | ICD-10-CM | POA: Diagnosis not present

## 2021-12-19 DIAGNOSIS — C50211 Malignant neoplasm of upper-inner quadrant of right female breast: Secondary | ICD-10-CM | POA: Diagnosis not present

## 2021-12-20 ENCOUNTER — Ambulatory Visit
Admission: RE | Admit: 2021-12-20 | Discharge: 2021-12-20 | Disposition: A | Discharge status: Home / Self Care | Payer: BC Managed Care – PPO | Source: Ambulatory Visit | Attending: Radiation Oncology | Admitting: Radiation Oncology

## 2021-12-20 DIAGNOSIS — C50211 Malignant neoplasm of upper-inner quadrant of right female breast: Secondary | ICD-10-CM | POA: Diagnosis not present

## 2021-12-20 DIAGNOSIS — Z170 Estrogen receptor positive status [ER+]: Secondary | ICD-10-CM | POA: Diagnosis not present

## 2021-12-20 DIAGNOSIS — Z510 Encounter for antineoplastic radiation therapy: Secondary | ICD-10-CM | POA: Diagnosis not present

## 2021-12-23 ENCOUNTER — Other Ambulatory Visit: Payer: Self-pay

## 2021-12-23 ENCOUNTER — Ambulatory Visit
Admission: RE | Admit: 2021-12-23 | Discharge: 2021-12-23 | Disposition: A | Discharge status: Home / Self Care | Payer: BC Managed Care – PPO | Source: Ambulatory Visit | Attending: Radiation Oncology | Admitting: Radiation Oncology

## 2021-12-23 DIAGNOSIS — Z510 Encounter for antineoplastic radiation therapy: Secondary | ICD-10-CM | POA: Diagnosis not present

## 2021-12-23 DIAGNOSIS — Z170 Estrogen receptor positive status [ER+]: Secondary | ICD-10-CM | POA: Diagnosis not present

## 2021-12-23 DIAGNOSIS — C50211 Malignant neoplasm of upper-inner quadrant of right female breast: Secondary | ICD-10-CM | POA: Diagnosis not present

## 2021-12-24 ENCOUNTER — Ambulatory Visit
Admission: RE | Admit: 2021-12-24 | Discharge: 2021-12-24 | Disposition: A | Discharge status: Home / Self Care | Payer: BC Managed Care – PPO | Source: Ambulatory Visit | Attending: Radiation Oncology | Admitting: Radiation Oncology

## 2021-12-24 DIAGNOSIS — C50211 Malignant neoplasm of upper-inner quadrant of right female breast: Secondary | ICD-10-CM | POA: Diagnosis not present

## 2021-12-24 DIAGNOSIS — Z510 Encounter for antineoplastic radiation therapy: Secondary | ICD-10-CM | POA: Diagnosis not present

## 2021-12-24 DIAGNOSIS — Z170 Estrogen receptor positive status [ER+]: Secondary | ICD-10-CM | POA: Diagnosis not present

## 2021-12-24 DIAGNOSIS — M4312 Spondylolisthesis, cervical region: Secondary | ICD-10-CM | POA: Diagnosis not present

## 2021-12-24 DIAGNOSIS — M4802 Spinal stenosis, cervical region: Secondary | ICD-10-CM | POA: Diagnosis not present

## 2021-12-24 DIAGNOSIS — M5412 Radiculopathy, cervical region: Secondary | ICD-10-CM | POA: Diagnosis not present

## 2021-12-25 ENCOUNTER — Ambulatory Visit
Admission: RE | Admit: 2021-12-25 | Discharge: 2021-12-25 | Disposition: A | Discharge status: Home / Self Care | Payer: BC Managed Care – PPO | Source: Ambulatory Visit | Attending: Radiation Oncology | Admitting: Radiation Oncology

## 2021-12-25 ENCOUNTER — Other Ambulatory Visit: Payer: Self-pay

## 2021-12-25 DIAGNOSIS — Z170 Estrogen receptor positive status [ER+]: Secondary | ICD-10-CM | POA: Diagnosis not present

## 2021-12-25 DIAGNOSIS — Z510 Encounter for antineoplastic radiation therapy: Secondary | ICD-10-CM | POA: Insufficient documentation

## 2021-12-25 DIAGNOSIS — C50211 Malignant neoplasm of upper-inner quadrant of right female breast: Secondary | ICD-10-CM | POA: Insufficient documentation

## 2021-12-26 ENCOUNTER — Ambulatory Visit
Admission: RE | Admit: 2021-12-26 | Discharge: 2021-12-26 | Disposition: A | Discharge status: Home / Self Care | Payer: BC Managed Care – PPO | Source: Ambulatory Visit | Attending: Radiation Oncology | Admitting: Radiation Oncology

## 2021-12-26 ENCOUNTER — Encounter: Payer: Self-pay | Admitting: Hematology

## 2021-12-26 DIAGNOSIS — Z170 Estrogen receptor positive status [ER+]: Secondary | ICD-10-CM | POA: Diagnosis not present

## 2021-12-26 DIAGNOSIS — Z510 Encounter for antineoplastic radiation therapy: Secondary | ICD-10-CM | POA: Diagnosis not present

## 2021-12-26 DIAGNOSIS — C50211 Malignant neoplasm of upper-inner quadrant of right female breast: Secondary | ICD-10-CM | POA: Diagnosis not present

## 2021-12-26 NOTE — Progress Notes (Signed)
Radiation patient called regarding financial hardship. ? ?Provided number to [facility] in Radiation at [phone] regarding [employer]. ? ?Advised I assists patients receiving chemotherapy. She verbalized understanding. ?

## 2021-12-27 ENCOUNTER — Ambulatory Visit: Payer: BC Managed Care – PPO | Admitting: Radiation Oncology

## 2021-12-27 ENCOUNTER — Ambulatory Visit
Admission: RE | Admit: 2021-12-27 | Discharge: 2021-12-27 | Disposition: A | Discharge status: Home / Self Care | Payer: BC Managed Care – PPO | Source: Ambulatory Visit | Attending: Radiation Oncology | Admitting: Radiation Oncology

## 2021-12-27 ENCOUNTER — Other Ambulatory Visit: Payer: Self-pay

## 2021-12-27 ENCOUNTER — Ambulatory Visit: Payer: BC Managed Care – PPO

## 2021-12-27 DIAGNOSIS — C50211 Malignant neoplasm of upper-inner quadrant of right female breast: Secondary | ICD-10-CM | POA: Diagnosis not present

## 2021-12-27 DIAGNOSIS — Z170 Estrogen receptor positive status [ER+]: Secondary | ICD-10-CM | POA: Diagnosis not present

## 2021-12-27 DIAGNOSIS — Z510 Encounter for antineoplastic radiation therapy: Secondary | ICD-10-CM | POA: Diagnosis not present

## 2021-12-30 ENCOUNTER — Other Ambulatory Visit: Payer: Self-pay

## 2021-12-30 ENCOUNTER — Ambulatory Visit: Payer: BC Managed Care – PPO

## 2021-12-30 ENCOUNTER — Ambulatory Visit
Admission: RE | Admit: 2021-12-30 | Discharge: 2021-12-30 | Disposition: A | Discharge status: Home / Self Care | Payer: BC Managed Care – PPO | Source: Ambulatory Visit | Attending: Radiation Oncology | Admitting: Radiation Oncology

## 2021-12-30 DIAGNOSIS — C50211 Malignant neoplasm of upper-inner quadrant of right female breast: Secondary | ICD-10-CM | POA: Diagnosis not present

## 2021-12-30 DIAGNOSIS — Z170 Estrogen receptor positive status [ER+]: Secondary | ICD-10-CM | POA: Diagnosis not present

## 2021-12-30 DIAGNOSIS — Z510 Encounter for antineoplastic radiation therapy: Secondary | ICD-10-CM | POA: Diagnosis not present

## 2021-12-31 ENCOUNTER — Ambulatory Visit: Payer: BC Managed Care – PPO

## 2021-12-31 ENCOUNTER — Ambulatory Visit
Admission: RE | Admit: 2021-12-31 | Discharge: 2021-12-31 | Disposition: A | Discharge status: Home / Self Care | Payer: BC Managed Care – PPO | Source: Ambulatory Visit | Attending: Radiation Oncology | Admitting: Radiation Oncology

## 2021-12-31 DIAGNOSIS — Z510 Encounter for antineoplastic radiation therapy: Secondary | ICD-10-CM | POA: Diagnosis not present

## 2021-12-31 DIAGNOSIS — M5412 Radiculopathy, cervical region: Secondary | ICD-10-CM | POA: Diagnosis not present

## 2021-12-31 DIAGNOSIS — C50211 Malignant neoplasm of upper-inner quadrant of right female breast: Secondary | ICD-10-CM | POA: Diagnosis not present

## 2021-12-31 DIAGNOSIS — R030 Elevated blood-pressure reading, without diagnosis of hypertension: Secondary | ICD-10-CM | POA: Diagnosis not present

## 2021-12-31 DIAGNOSIS — Z6827 Body mass index (BMI) 27.0-27.9, adult: Secondary | ICD-10-CM | POA: Diagnosis not present

## 2021-12-31 DIAGNOSIS — Z170 Estrogen receptor positive status [ER+]: Secondary | ICD-10-CM | POA: Diagnosis not present

## 2022-01-01 ENCOUNTER — Encounter (HOSPITAL_COMMUNITY): Payer: Self-pay

## 2022-01-01 ENCOUNTER — Ambulatory Visit: Payer: BC Managed Care – PPO

## 2022-01-01 ENCOUNTER — Ambulatory Visit
Admission: RE | Admit: 2022-01-01 | Discharge: 2022-01-01 | Disposition: A | Discharge status: Home / Self Care | Payer: BC Managed Care – PPO | Source: Ambulatory Visit | Attending: Radiation Oncology | Admitting: Radiation Oncology

## 2022-01-01 ENCOUNTER — Telehealth: Payer: Self-pay | Admitting: Radiation Oncology

## 2022-01-01 ENCOUNTER — Other Ambulatory Visit: Payer: Self-pay

## 2022-01-01 DIAGNOSIS — Z170 Estrogen receptor positive status [ER+]: Secondary | ICD-10-CM | POA: Diagnosis not present

## 2022-01-01 DIAGNOSIS — Z510 Encounter for antineoplastic radiation therapy: Secondary | ICD-10-CM | POA: Diagnosis not present

## 2022-01-01 DIAGNOSIS — C50211 Malignant neoplasm of upper-inner quadrant of right female breast: Secondary | ICD-10-CM | POA: Diagnosis not present

## 2022-01-01 NOTE — Telephone Encounter (Signed)
Called and spoke with patient about the [employer].  She states she will bring her income statement and bills tomorrow. ? ?Emily Montoya ?

## 2022-01-02 ENCOUNTER — Ambulatory Visit: Payer: BC Managed Care – PPO

## 2022-01-02 ENCOUNTER — Ambulatory Visit
Admission: RE | Admit: 2022-01-02 | Discharge: 2022-01-02 | Disposition: A | Discharge status: Home / Self Care | Payer: BC Managed Care – PPO | Source: Ambulatory Visit | Attending: Radiation Oncology | Admitting: Radiation Oncology

## 2022-01-02 DIAGNOSIS — C50211 Malignant neoplasm of upper-inner quadrant of right female breast: Secondary | ICD-10-CM | POA: Diagnosis not present

## 2022-01-02 DIAGNOSIS — Z170 Estrogen receptor positive status [ER+]: Secondary | ICD-10-CM | POA: Diagnosis not present

## 2022-01-02 DIAGNOSIS — Z510 Encounter for antineoplastic radiation therapy: Secondary | ICD-10-CM | POA: Diagnosis not present

## 2022-01-03 ENCOUNTER — Other Ambulatory Visit: Payer: Self-pay

## 2022-01-03 ENCOUNTER — Ambulatory Visit: Payer: BC Managed Care – PPO

## 2022-01-03 ENCOUNTER — Ambulatory Visit
Admission: RE | Admit: 2022-01-03 | Discharge: 2022-01-03 | Disposition: A | Discharge status: Home / Self Care | Payer: BC Managed Care – PPO | Source: Ambulatory Visit | Attending: Radiation Oncology | Admitting: Radiation Oncology

## 2022-01-03 DIAGNOSIS — Z510 Encounter for antineoplastic radiation therapy: Secondary | ICD-10-CM | POA: Diagnosis not present

## 2022-01-03 DIAGNOSIS — Z170 Estrogen receptor positive status [ER+]: Secondary | ICD-10-CM | POA: Diagnosis not present

## 2022-01-03 DIAGNOSIS — C50211 Malignant neoplasm of upper-inner quadrant of right female breast: Secondary | ICD-10-CM | POA: Diagnosis not present

## 2022-01-06 ENCOUNTER — Ambulatory Visit
Admission: RE | Admit: 2022-01-06 | Payer: BC Managed Care – PPO | Source: Ambulatory Visit | Admitting: Radiation Oncology

## 2022-01-06 ENCOUNTER — Ambulatory Visit: Payer: BC Managed Care – PPO | Attending: General Surgery

## 2022-01-06 ENCOUNTER — Ambulatory Visit
Admission: RE | Admit: 2022-01-06 | Discharge: 2022-01-06 | Disposition: A | Discharge status: Home / Self Care | Payer: BC Managed Care – PPO | Source: Ambulatory Visit | Attending: Radiation Oncology | Admitting: Radiation Oncology

## 2022-01-06 ENCOUNTER — Other Ambulatory Visit: Payer: Self-pay

## 2022-01-06 ENCOUNTER — Ambulatory Visit: Payer: BC Managed Care – PPO

## 2022-01-06 VITALS — Wt 160.4 lb

## 2022-01-06 DIAGNOSIS — Z170 Estrogen receptor positive status [ER+]: Secondary | ICD-10-CM | POA: Diagnosis not present

## 2022-01-06 DIAGNOSIS — Z483 Aftercare following surgery for neoplasm: Secondary | ICD-10-CM | POA: Insufficient documentation

## 2022-01-06 DIAGNOSIS — C50211 Malignant neoplasm of upper-inner quadrant of right female breast: Secondary | ICD-10-CM | POA: Diagnosis not present

## 2022-01-06 DIAGNOSIS — Z510 Encounter for antineoplastic radiation therapy: Secondary | ICD-10-CM | POA: Diagnosis not present

## 2022-01-06 NOTE — Therapy (Signed)
[facility] [place] ?[place], [place], [address] ?Phone: [phone]   Fax:  [phone] ? ?Physical Therapy Treatment ? ?Patient Details  ?Name: Emily Montoya ?MRN: [id number] ?Date of Birth: November 13, 1970 ?Referring Provider (PT): Marlou Starks ? ? ?Encounter Date: 01/06/2022 ? ? PT End of Session - 01/06/22 0955   ? ? Visit Number 5   screen only  ? PT Start Time [phone]   ? PT Stop Time 1000   ? PT Time Calculation (min) 5 min   ? ?  ?  ? ?  ? ? ?Past Medical History:  ?Diagnosis Date  ? Breast cancer ([place]) 08/20/2021  ? Sickle cell trait ([place])   ? ? ?Past Surgical History:  ?Procedure Laterality Date  ? BREAST LUMPECTOMY WITH RADIOACTIVE SEED AND SENTINEL LYMPH NODE BIOPSY Right 10/11/2021  ? Procedure: RIGHT BREAST LUMPECTOMY WITH RADIOACTIVE SEED LOCALIZATION AND SENTINEL LYMPH NODE BIOPSY;  Surgeon: [name], MD;  Location: [facility];  Service: General;  Laterality: Right;  ? TUBAL LIGATION    ? ? ?Vitals:  ? 01/06/22 0956  ?Weight: 160 lb 6 oz (72.7 kg)  ? ? ? ? ? ? ? ? ? ? ? L-DEX FLOWSHEETS - 01/06/22 0900   ? ?  ? L-DEX LYMPHEDEMA SCREENING  ? Measurement Type Unilateral   ? L-DEX MEASUREMENT EXTREMITY Upper Extremity   ? POSITION  Standing   ? DOMINANT SIDE Right   ? At Risk Side Right   ? BASELINE SCORE (UNILATERAL) -0.3   ? L-DEX SCORE (UNILATERAL) 5.2   ? VALUE CHANGE (UNILAT) 5.5   ? ?  ?  ? ?  ? ? ? ? ? ? ? ? ? ? ? ? ? ? ? ? ? ? ? ? ? ? ? ? ? ? PT Long Term Goals - 10/30/21 1209   ? ?  ? PT LONG TERM GOAL #1  ? Title Pt will demonstrate a return to baseline shoulder ROM and not demonstrate any signs or symptoms of lymphedema.   ? Time 4   ? Period Weeks   ? Status On-going   ?  ? PT LONG TERM GOAL #2  ? Title Pt will demonstrate 160 degrees of R shoulder flexion to allow her to reach overhead.   ? Baseline 61   ? Time 4   ? Period Weeks   ? Status New   ? Target Date 11/27/21   ?  ? PT LONG TERM GOAL #3  ? Title Pt will  demonstrate 165 degrees of R shoulder abduction to allow her to reach out to the side.   ? Baseline 65   ? Time 4   ? Period Weeks   ? Status New   ? Target Date 11/27/21   ?  ? PT LONG TERM GOAL #4  ? Title Pt will be able to stand without guarded positioning and with elbow extended.   ? Baseline Pt stands with elbow flexed to 120 and arm held across chest   ? Time 4   ? Period Weeks   ? Status New   ? Target Date 11/27/21   ?  ? PT LONG TERM GOAL #5  ? Title Pt will report a 50% improvement in pain throughout R upper quadrant to allow improved comfort   ? Time 4   ? Period Weeks   ? Status New   ? Target Date 11/27/21   ?  ?  Additional Long Term Goals  ? Additional Long Term Goals Yes   ?  ? PT LONG TERM GOAL #6  ? Title Pt will be independent with self MLD to decrease swelling in R breast.   ? Time 4   ? Period Weeks   ? Status New   ? Target Date 11/27/21   ?  ? PT LONG TERM GOAL #7  ? Title Pt will be independent in a home exercise program for continued stretching and strengthening   ? Time 4   ? Period Weeks   ? Status New   ? Target Date 11/27/21   ? ?  ?  ? ?  ? ? ? ? ? ? ? ? Plan - 01/06/22 0958   ? ? Clinical Impression Statement 3 month SOZO scan still WNL in the green zone.  Will rescan in 3 months for continued surveillance   ? ?  ?  ? ?  ? ? ?Patient will benefit from skilled therapeutic intervention in order to improve the following deficits and impairments:    ? ?Visit Diagnosis: ?Aftercare following surgery for neoplasm ? ? ? ? ?Problem List ?Patient Active Problem List  ? Diagnosis Date Noted  ? Genetic testing 09/23/2021  ? Malignant neoplasm of upper-inner quadrant of right breast in female, estrogen receptor positive ([place]) 08/29/2021  ? ? ?Otelia Limes, PTA ?01/06/2022, 9:59 AM ? ?[facility] ?[place], [place], [address] ?Phone: [phone]   Fax:  [phone] ? ?Name: Emily Montoya ?MRN: [id number] ?Date of  Birth: [birth year] ? ? ? ?

## 2022-01-07 ENCOUNTER — Ambulatory Visit
Admission: RE | Admit: 2022-01-07 | Discharge: 2022-01-07 | Disposition: A | Discharge status: Home / Self Care | Payer: BC Managed Care – PPO | Source: Ambulatory Visit | Attending: Radiation Oncology | Admitting: Radiation Oncology

## 2022-01-07 ENCOUNTER — Ambulatory Visit: Payer: BC Managed Care – PPO

## 2022-01-07 DIAGNOSIS — Z510 Encounter for antineoplastic radiation therapy: Secondary | ICD-10-CM | POA: Diagnosis not present

## 2022-01-07 DIAGNOSIS — C50211 Malignant neoplasm of upper-inner quadrant of right female breast: Secondary | ICD-10-CM | POA: Diagnosis not present

## 2022-01-07 DIAGNOSIS — Z170 Estrogen receptor positive status [ER+]: Secondary | ICD-10-CM | POA: Diagnosis not present

## 2022-01-08 ENCOUNTER — Ambulatory Visit
Admission: RE | Admit: 2022-01-08 | Discharge: 2022-01-08 | Disposition: A | Discharge status: Home / Self Care | Payer: BC Managed Care – PPO | Source: Ambulatory Visit | Attending: Radiation Oncology | Admitting: Radiation Oncology

## 2022-01-08 ENCOUNTER — Encounter: Payer: Self-pay | Admitting: Hematology

## 2022-01-08 ENCOUNTER — Inpatient Hospital Stay (HOSPITAL_BASED_OUTPATIENT_CLINIC_OR_DEPARTMENT_OTHER): Payer: BC Managed Care – PPO | Admitting: Hematology

## 2022-01-08 ENCOUNTER — Other Ambulatory Visit: Payer: Self-pay

## 2022-01-08 ENCOUNTER — Ambulatory Visit: Payer: BC Managed Care – PPO

## 2022-01-08 VITALS — BP 115/74 | HR 66 | Temp 99.3°F | Resp 18 | Ht 62.0 in | Wt 161.3 lb

## 2022-01-08 DIAGNOSIS — C50211 Malignant neoplasm of upper-inner quadrant of right female breast: Secondary | ICD-10-CM

## 2022-01-08 DIAGNOSIS — Z510 Encounter for antineoplastic radiation therapy: Secondary | ICD-10-CM | POA: Diagnosis not present

## 2022-01-08 DIAGNOSIS — Z170 Estrogen receptor positive status [ER+]: Secondary | ICD-10-CM

## 2022-01-08 NOTE — Progress Notes (Signed)
?[place]   ?[phone]) [phone] [phone]   ?Clinic Follow up Note  ? ?Patient Care Team: ?Wenda Low, MD as PCP - General (Internal Medicine) ?[name], MD as Consulting Physician (General Surgery) ?Truitt Merle, MD as Consulting Physician (Hematology) ?Rockwell Germany, RN as Oncology Nurse Navigator ?Mauro Kaufmann, RN as Oncology Nurse Navigator ? ?Date of Service:  01/08/2022 ? ?CHIEF COMPLAINT: f/u of right breast cancer ? ?CURRENT THERAPY:  ?Tamoxifen, started 09/09/21 ? ?ASSESSMENT & PLAN:  ?Emily Montoya is a 51 y.o. premenopausal female with  ? ?1. Malignant neoplasm of upper-inner quadrant of right breast, Stage IA, p(T1c, N0), ER+/PR+/HER2-, Grade 2  ?-presented with palpable right breast lump x6 months. Biopsy 08/20/21 showed IDC, grade 2, Ki67 of 15%. ?-she was started on tamoxifen neoadjuvantly due to heavy menses. ?-right lumpectomy on 10/11/21 under Dr. Marlou Starks showed 1.4 cm invasive and in situ ductal carcinoma. Margins and lymph nodes negative. ?-oncotype RS of 16, low risk. I reviewed the result with her today. Adjuvant chemo is not recommended  ?-She resumed tamoxifen in early 10/2021. ?-she is currently receiving adjuvant radiation under Dr. Lisbeth Renshaw, started 12/12/21. Last treatment scheduled for tomorrow, 01/09/22. ?-We also discussed the breast cancer surveillance after her surgery. She will continue annual screening mammogram, self exam, and a routine office visit with lab and exam with [name]. ?-I encouraged her to have healthy diet and exercise regularly.  ?  ?2. Genetic testing ?-given her young age, she qualifies for genetic testing.  ?-testing on 09/05/21 was negative. ?  ?3. Right hip/Back pain, Right shoulder pain ?-she has had pain since 02/2021. She has undergone CT's and x-rays with no explanation. ?-lumbar spine MRI on 09/09/21 showed mild facet arthropathy, which can cause pain. No evidence of metastatic disease. ?-she reports she has been seen by  orthopedics. She notes she developed right shoulder pain following surgery; per pt, cervical spine MRI showed a bone spur. ?  ?  ?PLAN:  ?-continue tamoxifen ?-survivorship in 3 months ?-lab and f/u in 6 months ? ? ?No problem-specific Assessment & Plan notes found for this encounter. ? ? ?SUMMARY OF ONCOLOGIC HISTORY: ?Oncology History Overview Note  ? Cancer Staging  ?Malignant neoplasm of upper-inner quadrant of right breast in female, estrogen receptor positive ([place]) ?Staging form: Breast, AJCC 8th Edition ?- Clinical stage from 08/20/2021: Stage IA (cT1c, cN0, cM0, G2, ER+, PR+, HER2-) - Signed by Truitt Merle, MD on 09/02/2021 ?Method of lymph node assessment: Clinical ?Histologic grading system: 3 grade system ?- Pathologic stage from 10/11/2021: Stage IA (pT1c, pN0, cM0, G2, ER+, PR+, HER2-, Oncotype DX score: 16) - Signed by Truitt Merle, MD on 01/08/2022 ?Stage prefix: Initial diagnosis ?Multigene prognostic tests performed: Oncotype DX ?Recurrence score range: Greater than or equal to 11 ?Histologic grading system: 3 grade system ? ? ?  ?Malignant neoplasm of upper-inner quadrant of right breast in female, estrogen receptor positive ([place])  ?08/14/2021 Mammogram  ? EXAM: ?DIGITAL DIAGNOSTIC BILATERAL MAMMOGRAM WITH TOMOSYNTHESIS AND CAD; ?ULTRASOUND RIGHT BREAST LIMITED ? ?IMPRESSION: ?1. Suspicious right breast mass at the 1 o'clock position 15 cm from the nipple on the right. It measures 1.1 x 1.3 x 0.9 cm. Recommendation is for ultrasound-guided biopsy. ?2. No suspicious right axillary lymphadenopathy. ?3. No mammographic evidence of malignancy on the left. ?  ?08/20/2021 Pathology Results  ? Diagnosis ?Breast, right, needle core biopsy, 1 o'clock, 15 cmfn, ribbon clip ?- INVASIVE DUCTAL CARCINOMA ?- SEE COMMENT ? ?Microscopic Comment ?Based  on the biopsy, the carcinoma appears Nottingham grade 2 of 3 and measures 1.2 cm in greatest linear extent. ? ?PROGNOSTIC INDICATORS ?Results: ?The tumor cells are NEGATIVE  for Her2 (1+). ?Estrogen Receptor: 90%, POSITIVE, MODERATE STAINNG INTENSITY ?Progesterone Receptor: 90%, POSITIVE, STRONG STAINING INTENSITY ?Proliferation Marker Ki67: 15% ?  ?08/20/2021 Cancer Staging  ? Staging form: Breast, AJCC 8th Edition ?- Clinical stage from 08/20/2021: Stage IA (cT1c, cN0, cM0, G2, ER+, PR+, HER2-) - Signed by Truitt Merle, MD on 09/02/2021 ?Method of lymph node assessment: Clinical ?Histologic grading system: 3 grade system ? ?  ?08/29/2021 Initial Diagnosis  ? Malignant neoplasm of upper-inner quadrant of right breast in female, estrogen receptor positive ([place]) ?  ?09/22/2021 Genetic Testing  ? Negative genetic testing on the CancerNext-Expanded+RNAinsight panel.  The report date is September 20, 2021. ? ?The CancerNext-Expanded gene panel offered by [facility] and includes sequencing and rearrangement analysis for the following 77 genes: AIP, ALK, APC*, ATM*, AXIN2, BAP1, BARD1, BLM, BMPR1A, BRCA1*, BRCA2*, BRIP1*, CDC73, CDH1*, CDK4, CDKN1B, CDKN2A, CHEK2*, CTNNA1, DICER1, FANCC, FH, FLCN, GALNT12, KIF1B, LZTR1, MAX, MEN1, MET, MLH1*, MSH2*, MSH3, MSH6*, MUTYH*, NBN, NF1*, NF2, NTHL1, PALB2*, PHOX2B, PMS2*, POT1, PRKAR1A, PTCH1, PTEN*, RAD51C*, RAD51D*, RB1, RECQL, RET, SDHA, SDHAF2, SDHB, SDHC, SDHD, SMAD4, SMARCA4, SMARCB1, SMARCE1, STK11, SUFU, TMEM127, TP53*, TSC1, TSC2, VHL and XRCC2 (sequencing and deletion/duplication); EGFR, EGLN1, HOXB13, KIT, MITF, PDGFRA, POLD1, and POLE (sequencing only); EPCAM and GREM1 (deletion/duplication only). DNA and RNA analyses performed for * genes. ?  ?10/11/2021 Cancer Staging  ? Staging form: Breast, AJCC 8th Edition ?- Pathologic stage from 10/11/2021: Stage IA (pT1c, pN0, cM0, G2, ER+, PR+, HER2-, Oncotype DX score: 16) - Signed by Truitt Merle, MD on 01/08/2022 ?Stage prefix: Initial diagnosis ?Multigene prognostic tests performed: Oncotype DX ?Recurrence score range: Greater than or equal to 11 ?Histologic grading system: 3 grade system ? ?   ?10/11/2021 Definitive Surgery  ? FINAL MICROSCOPIC DIAGNOSIS:  ? ?A. LYMPH NODE, RIGHT AXILLARY #1, SENTINEL, EXCISION:  ?- One lymph node negative for metastatic carcinoma (0/1).  ? ?B. LYMPH NODE, RIGHT AXILLARY, SENTINEL, EXCISION:  ?- One lymph node negative for metastatic carcinoma (0/1).  ? ?C. LYMPH NODE, RIGHT AXILLARY, SENTINEL, EXCISION:  ?- One lymph node negative for metastatic carcinoma (0/1).  ? ?D. LYMPH NODE, RIGHT AXILLARY #2, SENTINEL, EXCISION:  ?- One lymph node negative for metastatic carcinoma (0/1).  ? ?E. LYMPH NODE, RIGHT AXILLARY, SENTINEL, EXCISION:  ?- One lymph node negative for metastatic carcinoma (0/1).  ? ?F. BREAST, RIGHT, LUMPECTOMY:  ?- Invasive and in situ ductal carcinoma, 1.4 cm.  ?- Margins negative for carcinoma.  ?- Biopsy site and biopsy clip.  ?- See oncology table.  ?  ?10/11/2021 Oncotype testing  ? Oncotype DX was obtained on the final surgical sample and the recurrence score of 16 predicts a risk of recurrence outside the breast over the next 9 years of 4%, if the patient's only systemic therapy is an antiestrogen for 5 years.  It also predicts no significant benefit from chemotherapy. ? ?  ? ? ? ?INTERVAL HISTORY:  ?Emily Montoya is here for a follow up of breast cancer. She was last seen by me on 09/02/21 in consultation. She presents to the clinic accompanied by her sister. ?She reports she is tolerating treatment overall fairly. She has skin burning and tenderness.  ?She reports her periods have lightened and become more sporadic on tamoxifen. ?  ?All other systems were reviewed with the patient and  are negative. ? ?MEDICAL HISTORY:  ?Past Medical History:  ?Diagnosis Date  ? Breast cancer ([place]) 08/20/2021  ? Sickle cell trait ([place])   ? ? ?SURGICAL HISTORY: ?Past Surgical History:  ?Procedure Laterality Date  ? BREAST LUMPECTOMY WITH RADIOACTIVE SEED AND SENTINEL LYMPH NODE BIOPSY Right 10/11/2021  ? Procedure: RIGHT BREAST LUMPECTOMY WITH RADIOACTIVE SEED  LOCALIZATION AND SENTINEL LYMPH NODE BIOPSY;  Surgeon: [name], MD;  Location: [facility];  Service: General;  Laterality: Right;  ? TUBAL LIGATION    ? ? ?I have reviewed the social

## 2022-01-09 ENCOUNTER — Encounter: Payer: Self-pay | Admitting: *Deleted

## 2022-01-09 ENCOUNTER — Encounter: Payer: Self-pay | Admitting: Radiation Oncology

## 2022-01-09 ENCOUNTER — Ambulatory Visit: Payer: BC Managed Care – PPO

## 2022-01-09 ENCOUNTER — Ambulatory Visit: Payer: BC Managed Care – PPO | Attending: Neurological Surgery

## 2022-01-09 ENCOUNTER — Ambulatory Visit
Admission: RE | Admit: 2022-01-09 | Discharge: 2022-01-09 | Disposition: A | Discharge status: Home / Self Care | Payer: BC Managed Care – PPO | Source: Ambulatory Visit | Attending: Radiation Oncology | Admitting: Radiation Oncology

## 2022-01-09 DIAGNOSIS — M25612 Stiffness of left shoulder, not elsewhere classified: Secondary | ICD-10-CM | POA: Insufficient documentation

## 2022-01-09 DIAGNOSIS — M25611 Stiffness of right shoulder, not elsewhere classified: Secondary | ICD-10-CM | POA: Insufficient documentation

## 2022-01-09 DIAGNOSIS — M542 Cervicalgia: Secondary | ICD-10-CM | POA: Diagnosis not present

## 2022-01-09 DIAGNOSIS — C50211 Malignant neoplasm of upper-inner quadrant of right female breast: Secondary | ICD-10-CM | POA: Diagnosis not present

## 2022-01-09 DIAGNOSIS — R252 Cramp and spasm: Secondary | ICD-10-CM | POA: Diagnosis not present

## 2022-01-09 DIAGNOSIS — Z510 Encounter for antineoplastic radiation therapy: Secondary | ICD-10-CM | POA: Diagnosis not present

## 2022-01-09 DIAGNOSIS — R293 Abnormal posture: Secondary | ICD-10-CM | POA: Diagnosis not present

## 2022-01-09 DIAGNOSIS — Z170 Estrogen receptor positive status [ER+]: Secondary | ICD-10-CM | POA: Diagnosis not present

## 2022-01-09 NOTE — Therapy (Signed)
?OUTPATIENT PHYSICAL THERAPY CERVICAL EVALUATION ? ? ?Patient Name: Emily Montoya ?MRN: [id number] ?DOB:[birth year], 51 y.o., female ?Today's Date: 01/09/2022 ? ? PT End of Session - 01/09/22 1140   ? ? Visit Number 1   ? Date for PT Re-Evaluation 03/06/22   ? Authorization Type BCBS   ? PT Start Time 1017   ? PT Stop Time 1059   ? PT Time Calculation (min) 42 min   ? Activity Tolerance Patient limited by pain   ? Behavior During Therapy [facility] for tasks assessed/performed   ? ?  ?  ? ?  ? ? ?Past Medical History:  ?Diagnosis Date  ? Breast cancer ([place]) 08/20/2021  ? Sickle cell trait ([place])   ? ?Past Surgical History:  ?Procedure Laterality Date  ? BREAST LUMPECTOMY WITH RADIOACTIVE SEED AND SENTINEL LYMPH NODE BIOPSY Right 10/11/2021  ? Procedure: RIGHT BREAST LUMPECTOMY WITH RADIOACTIVE SEED LOCALIZATION AND SENTINEL LYMPH NODE BIOPSY;  Surgeon: [name], MD;  Location: [facility];  Service: General;  Laterality: Right;  ? TUBAL LIGATION    ? ?Patient Active Problem List  ? Diagnosis Date Noted  ? Genetic testing 09/23/2021  ? Malignant neoplasm of upper-inner quadrant of right breast in female, estrogen receptor positive ([place]) 08/29/2021  ? ? ?PCP: Wenda Low, MD ? ?REFERRING PROVIDER: Eustace Moore, MD ? ?REFERRING DIAG: radiculopathy, cervical region M54.12 ? ?THERAPY DIAG:  ?Abnormal posture ? ?Cramp and spasm ? ?Cervicalgia ? ?Stiffness of right shoulder, not elsewhere classified ? ?Stiffness of left shoulder, not elsewhere classified ? ?ONSET DATE: 10/31/2021 ? ?SUBJECTIVE:                                                                                                                                                                                                        ? ?SUBJECTIVE STATEMENT: ?Pt is a Rt hand dominant female presents to PT with complaint of neck pain with Lt UE radiculopathy. Pt had MRI recently and reports bone spur and inflammation at C5-6.  Pt will start injections  in a couple of weeks.   ? ?PERTINENT HISTORY:  ?Rt breast cancer- Rt breast lumpectomy and SLNB on 10/11/21. ? ?PAIN:  ?Are you having pain? Yes: NPRS scale: 7/10 ?Pain location: Lt neck and arm ?Pain description: numbness and tingling into Lt fingers ?Aggravating factors: night time, constant  ?Relieving factors: Tramadol, nothing really helps  ?Pain worse at night: 9/10.  Waking at night with pain ? ?PRECAUTIONS: Other: Rt breast cancer with node removal.  No arm bike ? ?WEIGHT BEARING RESTRICTIONS  No ? ?FALLS:  ?Has patient fallen in last 6 months? No ?Number of falls: 0 ? ?LIVING ENVIRONMENT: ?Lives with: lives with their family ?Lives in: House/apartment ? 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  Hasn't been back to work since breast cancer diagnosis.   ? ?PLOF: Independent ? ?PATIENT GOALS reduce neck pain ? ?OBJECTIVE:  ? ?DIAGNOSTIC FINDINGS:  ?X-ray: 11/11/21 Mild degenerative changes.  No evidence for acute  abnormality ?MRI: inflammation and bone spur at C5-6 per pt report.   ? ?PATIENT SURVEYS:  ?FOTO 81 (goal is 75) ? ? ?COGNITION: ?Overall cognitive status: Within functional limits for tasks assessed ?SENSATION: ?WFL ? ?POSTURE:  ?Forward head, anterior pelvic tilt, weight shift to the Rt with lateral cervical flexion  ?PALPATION: ?Significant Lt upper trap tension, bil suboccipitals.  Reduced PA mobility in the cervical and thoracic spine.  Pain C5-T2 with mobs.    ? ?CERVICAL ROM:  ? ?Active ROM A/PROM (deg) ?01/09/2022  ?Flexion 40  ?Extension 35  ?Right lateral flexion 45  ?Left lateral flexion 40  ?Right rotation 70  ?Left rotation 70  ? (Blank rows = not tested) Lt UE symptoms with Lt motions ? ?UE ROM: ? Rt UE significantly limited 75% due to post lumpectomy and radiation.  ? Lt UE: limited by 25% with pain at end range of all motion of the Lt UE ? ?UE MMT: ?Rt- not tested due to pain ?Lt UE: flexion 4/5, abduction 4/5, IR 4+/5, ER 4/5 ?CERVICAL SPECIAL TESTS:   ?Upper limb tension test (ULTT): Positive ? ?TODAY'S TREATMENT:  ?Treatment on date: 01/09/22 ?HEP estabilished- see below  ? ? ?PATIENT EDUCATION:  ?Education details: Access Code: [email or web address] ?Person educated: Patient ?Education method: Explanation, Demonstration, and Handouts ?Education comprehension: verbalized understanding and returned demonstration ? ? ?HOME EXERCISE PROGRAM: ?Access Code: [email or web address] ?URL: [email or web address] ?Date: 01/09/2022 ?Prepared by: Claiborne Billings ? ?Exercises ?Seated Cervical Flexion AROM - 3 x daily - 7 x weekly - 1 sets - 3 reps - 20 hold ?Seated Cervical Sidebending AROM - 3 x daily - 7 x weekly - 1 sets - 3 reps - 20 hold ?Seated Cervical Rotation AROM - 3 x daily - 7 x weekly - 1 sets - 3 reps - 20 hold ?Seated Correct Posture - 1 x daily - 7 x weekly - 3 sets - 10 reps ?Standing Median Nerve Glide - 1 x daily - 7 x weekly - 3 sets - 10 reps ?Median Nerve Rocking - 1 x daily - 7 x weekly - 3 sets - 10 reps ? ? ?ASSESSMENT: ? ?CLINICAL IMPRESSION: ?Patient is a 51 y.o. female  who was seen today for physical therapy evaluation and treatment for neck pain and Lt radiculopathy. Pt has had recent Rt breast cancer with node removal and finished 20 sessions of radiation today.  Pt reports 7/10 constant Lt UE pain and describes and tingling and numb.  Pt with significant limitation in Rt UE A/ROM s/p radiation and PT encouraged pt to return to her exercises for this.  Pt with reduced Lt UE strength, A/ROM and grip.  Posture is assymmetrical with shift to the Rt and pt demonstrates forward head and rounded shoulder motion.  Tension and significant trigger points in th Lt UT and bil suboccipitals.  ? ? ?OBJECTIVE IMPAIRMENTS decreased activity tolerance, decreased ROM, decreased strength, hypomobility, increased muscle spasms, impaired flexibility, impaired UE functional use, and pain.  ? ?ACTIVITY LIMITATIONS cleaning, community activity, occupation, and laundry.  ? ?  PERSONAL FACTORS  Age and 1 comorbidity: Rt breast cancer   are also affecting patient's functional outcome.  ? ? ?REHAB POTENTIAL: Good ? ?CLINICAL DECISION MAKING: Stable/uncomplicated ? ?EVALUATION COMPLEXITY: Low ? ? ?GOALS: ?Goals reviewed with patient? Yes ? ?SHORT TERM GOALS: Target date: 02/06/2022 ? ?Be independent in initial HEP ?Baseline:  ?Goal status: INITIAL ? ?2.  Verbalize and demonstrate postural modifications for neutral alignment for reduced cervical strain ?Baseline:  ?Goal status: INITIAL ? ?3.  Report 25% fewer sleep interruptions due to Lt UE pain ?Baseline: waking >5 times a night  ?Goal status: INITIAL ? ?4.  Report > or = to 30% reduction in frequency and intensity of Lt UE pain to improve independence with home tasks ?Baseline: 5-7/10 constant pain  ?Goal status: INITIAL ? ? ?LONG TERM GOALS: Target date: 03/06/2022 ? ?Be independent in advanced HEP ?Baseline:  ?Goal status: INITIAL ? ?2.  Improve FOTO > or = to 57 ?Baseline: 43 ?Goal status: INITIAL ? ?3.  Sleep with < or = to 2 sleep interruptions due to LBP ?Baseline: waking every hour ?Goal status: INITIAL ? ?4.  Demonstrate > or = to =30# Lt grip strength to improve  ?Baseline: 15# (Rt is 35#) ?Goal status: INITIAL ? ?5.  Report > or = to 70% reduction in the frequency and intensity of neck pain and Lt UE radiculopathy to facilitate return to work ?Baseline: 7/10 constant pain ?Goal status: INITIAL ? ?6.  Demonstrate full Lt shoulder A/ROM to improve functional use with self-care and home tasks  ?Baseline: limited 25%  ?Goal status: INITIAL ? ? ?PLAN: ?PT FREQUENCY: 2x/week ? ?PT DURATION: 8 weeks ? ?PLANNED INTERVENTIONS: Therapeutic exercises, Therapeutic activity, Neuromuscular re-education, Balance training, Gait training, Patient/Family education, Joint manipulation, Joint mobilization, Dry Needling, Electrical stimulation, Spinal manipulation, Spinal mobilization, Cryotherapy, Moist heat, Taping, Traction, Ionotophoresis '4mg'$ /ml Dexamethasone, and  Manual therapy ? ?PLAN FOR NEXT SESSION: review HEP, DN to bil neck, suboccipitals and Lt Upper traps.  Cervical traction.  Pulleys for ROM bil.  ? ?Sigurd Sos, PT ?01/09/22 11:42 AM  ? ?[place] [facility]

## 2022-01-10 ENCOUNTER — Ambulatory Visit: Payer: BC Managed Care – PPO

## 2022-01-13 ENCOUNTER — Ambulatory Visit: Payer: BC Managed Care – PPO

## 2022-01-14 ENCOUNTER — Ambulatory Visit: Payer: BC Managed Care – PPO

## 2022-01-15 ENCOUNTER — Ambulatory Visit: Payer: BC Managed Care – PPO

## 2022-01-15 ENCOUNTER — Other Ambulatory Visit: Payer: Self-pay

## 2022-01-15 DIAGNOSIS — M25611 Stiffness of right shoulder, not elsewhere classified: Secondary | ICD-10-CM | POA: Diagnosis not present

## 2022-01-15 DIAGNOSIS — M25612 Stiffness of left shoulder, not elsewhere classified: Secondary | ICD-10-CM

## 2022-01-15 DIAGNOSIS — R293 Abnormal posture: Secondary | ICD-10-CM

## 2022-01-15 DIAGNOSIS — M542 Cervicalgia: Secondary | ICD-10-CM | POA: Diagnosis not present

## 2022-01-15 DIAGNOSIS — R252 Cramp and spasm: Secondary | ICD-10-CM

## 2022-01-15 NOTE — Patient Instructions (Signed)

## 2022-01-15 NOTE — Therapy (Addendum)
OUTPATIENT PHYSICAL THERAPY TREATMENT NOTE   Patient Name: Emily Montoya MRN: [id number] DOB:1971/06/25, 51 y.o., female Today's Date: 01/15/2022  PCP: Wenda Low, MD REFERRING PROVIDER: Eustace Moore, MD   PT End of Session - 01/15/22 1012     Visit Number 2    Date for PT Re-Evaluation 03/06/22    Authorization Type [employer]    PT Start Time [phone]    PT Stop Time 1016    PT Time Calculation (min) 45 min    Activity Tolerance Patient tolerated treatment well    Behavior During Therapy [facility] for tasks assessed/performed             Past Medical History:  Diagnosis Date   Breast cancer ([place]) 08/20/2021   Sickle cell trait ([place])    Past Surgical History:  Procedure Laterality Date   BREAST LUMPECTOMY WITH RADIOACTIVE SEED AND SENTINEL LYMPH NODE BIOPSY Right 10/11/2021   Procedure: RIGHT BREAST LUMPECTOMY WITH RADIOACTIVE SEED LOCALIZATION AND SENTINEL LYMPH NODE BIOPSY;  Surgeon: [name], MD;  Location: [place];  Service: General;  Laterality: Right;   TUBAL LIGATION     Patient Active Problem List   Diagnosis Date Noted   Genetic testing 09/23/2021   Malignant neoplasm of upper-inner quadrant of right breast in female, estrogen receptor positive ([place]) 08/29/2021    REFERRING DIAG: radiculopathy, cervical region M54.12  THERAPY DIAG:  Abnormal posture  Cramp and spasm  Cervicalgia  Stiffness of right shoulder, not elsewhere classified  Stiffness of left shoulder, not elsewhere classified  PERTINENT HISTORY: Rt breast cancer- Rt breast lumpectomy and SLNB on 10/11/21.  PRECAUTIONS: Rt radiation-gentle mobility on Rt only  SUBJECTIVE: I tried stretching my Rt shoulder gently and the skin is a little broken.    PAIN:  Are you having pain? Yes: NPRS scale: 6/10 Pain location: neck and arm Pain description: Lt only Aggravating factors: daily activity, it just hurts  Relieving factors: nothing, sometimes  stretching   OBJECTIVE:    DIAGNOSTIC FINDINGS:  X-ray: 11/11/21 Mild degenerative changes.  No evidence for acute  abnormality MRI: inflammation and bone spur at C5-6 per pt report.     PATIENT SURVEYS:  FOTO 43 (goal is 11)    POSTURE:  Forward head, anterior pelvic tilt, weight shift to the Rt with lateral cervical flexion  PALPATION: Significant Lt upper trap tension, bil suboccipitals.  Reduced PA mobility in the cervical and thoracic spine.  Pain C5-T2 with mobs.      CERVICAL ROM:    Active ROM A/PROM (deg) 01/09/2022  Flexion 40  Extension 35  Right lateral flexion 45  Left lateral flexion 40  Right rotation 70  Left rotation 70   (Blank rows = not tested) Lt UE symptoms with Lt motions   UE ROM:            Rt UE significantly limited 75% due to post lumpectomy and radiation.             Lt UE: limited by 25% with pain at end range of all motion of the Lt UE   UE MMT: Rt- not tested due to pain Lt UE: flexion 4/5, abduction 4/5, IR 4+/5, ER 4/5 CERVICAL SPECIAL TESTS:  Upper limb tension test (ULTT): Positive   TODAY'S TREATMENT:  Treatment on date: 01/15/22 Review of HEP: cervical ROM- gentle.  Good demo of each.  Shan Levans assessed skin break in the Rt axilla and gave pt non-stick gauze for this.  Skin not open, just layer of skin flaked off.   Chest press in supine: x20 with cane Cervical mechanical traction: 16/5# 60 seconds on/10 off x12 min Trigger Point Dry-Needling  Treatment instructions: Expect mild to moderate muscle soreness. S/S of pneumothorax if dry needled over a lung field, and to seek immediate medical attention should they occur. Patient verbalized understanding of these instructions and education.  Patient Consent Given: Yes Education handout provided: Yes Muscles treated: Bil upper traps, bil cervical multifidi Treatment response/outcome: twitch response  Skilled palpation and monitoring by PT during dry needling   Treatment on date:  01/09/22 HEP estabilished- see below      PATIENT EDUCATION:  Education details: Access Code: [email or web address] Person educated: Patient Education method: Explanation, Demonstration, and Handouts Education comprehension: verbalized understanding and returned demonstration     HOME EXERCISE PROGRAM: Access Code: [email or web address] URL: [email or web address] Date: 01/09/2022 Prepared by: Claiborne Billings   Exercises Seated Cervical Flexion AROM - 3 x daily - 7 x weekly - 1 sets - 3 reps - 20 hold Seated Cervical Sidebending AROM - 3 x daily - 7 x weekly - 1 sets - 3 reps - 20 hold Seated Cervical Rotation AROM - 3 x daily - 7 x weekly - 1 sets - 3 reps - 20 hold Seated Correct Posture - 1 x daily - 7 x weekly - 3 sets - 10 reps Standing Median Nerve Glide - 1 x daily - 7 x weekly - 3 sets - 10 reps Median Nerve Rocking - 1 x daily - 7 x weekly - 3 sets - 10 reps     ASSESSMENT:   CLINICAL IMPRESSION: First time follow-up after evaluation.  Pt is sitting with improved posture with head in neutral today.  Pt with skin irritation  in Rt axilla and she was assessed by Shan Levans.  No oozing or break, just top layer of skin came off.  Pt will contact MD if this changes.  Pt guards her Rt UE and PT advised her to relax the arm at her side when sitting and walking.  Pt did well with chest press in supine and was able to fully extend both arms.  Pt is doing her exercises for cervical flexibility at home.  PT with significant tension in bil upper traps and cervical multifidi. Pt had good response to DN with twitch response throughout.  Pt responded well to cervical mechanical traction.  Pt will continue to benefit from skilled PT to address Lt UE radiculopathy, cervical muscle tension and Rt UE gentle ROM s/p lumpectomy and radiation.       OBJECTIVE IMPAIRMENTS decreased activity tolerance, decreased ROM, decreased strength, hypomobility, increased muscle spasms, impaired flexibility, impaired UE functional  use, and pain.    ACTIVITY LIMITATIONS cleaning, community activity, occupation, and laundry.    PERSONAL FACTORS Age and 1 comorbidity: Rt breast cancer   are also affecting patient's functional outcome.      REHAB POTENTIAL: Good   CLINICAL DECISION MAKING: Stable/uncomplicated   EVALUATION COMPLEXITY: Low     GOALS: Goals reviewed with patient? Yes   SHORT TERM GOALS: Target date: 02/06/2022   Be independent in initial HEP Baseline:  Goal status: INITIAL   2.  Verbalize and demonstrate postural modifications for neutral alignment for reduced cervical strain Baseline:  Goal status: INITIAL   3.  Report 25% fewer sleep interruptions due to Lt UE pain Baseline: waking >5 times a night  Goal status: INITIAL   4.  Report > or =  to 30% reduction in frequency and intensity of Lt UE pain to improve independence with home tasks Baseline: 5-7/10 constant pain  Goal status: INITIAL     LONG TERM GOALS: Target date: 03/06/2022   Be independent in advanced HEP Baseline:  Goal status: INITIAL   2.  Improve FOTO > or = to 57 Baseline: 43 Goal status: INITIAL   3.  Sleep with < or = to 2 sleep interruptions due to LBP Baseline: waking every hour Goal status: INITIAL   4.  Demonstrate > or = to =30# Lt grip strength to improve  Baseline: 15# (Rt is 35#) Goal status: INITIAL   5.  Report > or = to 70% reduction in the frequency and intensity of neck pain and Lt UE radiculopathy to facilitate return to work Baseline: 7/10 constant pain Goal status: INITIAL   6.  Demonstrate full Lt shoulder A/ROM to improve functional use with self-care and home tasks  Baseline: limited 25%  Goal status: INITIAL     PLAN: PT FREQUENCY: 2x/week   PT DURATION: 8 weeks   PLANNED INTERVENTIONS: Therapeutic exercises, Therapeutic activity, Neuromuscular re-education, Balance training, Gait training, Patient/Family education, Joint manipulation, Joint mobilization, Dry Needling, Electrical  stimulation, Spinal manipulation, Spinal mobilization, Cryotherapy, Moist heat, Taping, Traction, Ionotophoresis 4mg /ml Dexamethasone, and Manual therapy   PLAN FOR NEXT SESSION: assess response to DN, traction if helpful.  Postural strength, UE ROM- gentle on Rt     Sigurd Sos, PT 01/15/22 10:15 AM  PHYSICAL THERAPY DISCHARGE SUMMARY  Visits from Start of Care: 2  Current functional level related to goals / functional outcomes: Pt didn't return to PT.  See above for most current status.     Remaining deficits: See above   Education / Equipment: HEP, posture    Patient agrees to discharge. Patient goals were not met. Patient is being discharged due to not returning since the last visit.  Sigurd Sos, PT 03/18/22 6:39 PM

## 2022-01-16 ENCOUNTER — Ambulatory Visit: Payer: BC Managed Care – PPO

## 2022-01-16 DIAGNOSIS — M5412 Radiculopathy, cervical region: Secondary | ICD-10-CM | POA: Diagnosis not present

## 2022-01-16 NOTE — Therapy (Incomplete)
?OUTPATIENT PHYSICAL THERAPY TREATMENT NOTE ? ? ?Patient Name: Emily Montoya ?MRN: [id number] ?DOB:[birth year], 51 y.o., female ?Today's Date: 01/16/2022 ? ?PCP: Wenda Low, MD ?REFERRING PROVIDER: Wenda Low, MD ? ? ? ?Past Medical History:  ?Diagnosis Date  ? Breast cancer ([place]) 08/20/2021  ? Sickle cell trait ([place])   ? ?Past Surgical History:  ?Procedure Laterality Date  ? BREAST LUMPECTOMY WITH RADIOACTIVE SEED AND SENTINEL LYMPH NODE BIOPSY Right 10/11/2021  ? Procedure: RIGHT BREAST LUMPECTOMY WITH RADIOACTIVE SEED LOCALIZATION AND SENTINEL LYMPH NODE BIOPSY;  Surgeon: [name], MD;  Location: [place];  Service: General;  Laterality: Right;  ? TUBAL LIGATION    ? ?Patient Active Problem List  ? Diagnosis Date Noted  ? Genetic testing 09/23/2021  ? Malignant neoplasm of upper-inner quadrant of right breast in female, estrogen receptor positive ([place]) 08/29/2021  ? ? ?REFERRING DIAG: radiculopathy, cervical region M54.12 ?  ?THERAPY DIAG:  ?Abnormal posture ?  ?Cramp and spasm ?  ?Cervicalgia ?  ?Stiffness of right shoulder, not elsewhere classified ?  ?Stiffness of left shoulder, not elsewhere classified ?  ?PERTINENT HISTORY: Rt breast cancer- Rt breast lumpectomy and SLNB on 10/11/21. ?  ?PRECAUTIONS: Rt radiation-gentle mobility on Rt only ?  ?SUBJECTIVE: I tried stretching my Rt shoulder gently and the skin is a little broken.   ?  ?PAIN:  ?Are you having pain? Yes: NPRS scale: 6/10 ?Pain location: neck and arm ?Pain description: Lt only ?Aggravating factors: daily activity, it just hurts  ?Relieving factors: nothing, sometimes stretching ?  ?  ?OBJECTIVE:  ?  ?DIAGNOSTIC FINDINGS:  ?X-ray: 11/11/21 Mild degenerative changes.  No evidence for acute  abnormality ?MRI: inflammation and bone spur at C5-6 per pt report.   ?  ?PATIENT SURVEYS:  ?FOTO 29 (goal is 60) ?  ?  ?POSTURE:  ?Forward head, anterior pelvic tilt, weight shift to the Rt with lateral cervical flexion   ?PALPATION: ?Significant Lt upper trap tension, bil suboccipitals.  Reduced PA mobility in the cervical and thoracic spine.  Pain C5-T2 with mobs.    ?  ?CERVICAL ROM:  ?  ?Active ROM A/PROM (deg) ?01/09/2022  ?Flexion 40  ?Extension 35  ?Right lateral flexion 45  ?Left lateral flexion 40  ?Right rotation 70  ?Left rotation 70  ? (Blank rows = not tested) Lt UE symptoms with Lt motions ?  ?UE ROM: ?           Rt UE significantly limited 75% due to post lumpectomy and radiation.  ?           Lt UE: limited by 25% with pain at end range of all motion of the Lt UE ?  ?UE MMT: ?Rt- not tested due to pain ?Lt UE: flexion 4/5, abduction 4/5, IR 4+/5, ER 4/5 ?CERVICAL SPECIAL TESTS:  ?Upper limb tension test (ULTT): Positive ?  ?TODAY'S TREATMENT:  ?Treatment on date: 01/17/21 ? ?Chest press in supine: x20 with cane ?Cervical mechanical traction: 16/5# 60 seconds on/10 off x12 min ? ?Treatment on date: 01/15/22 ?Review of HEP: cervical ROM- gentle.  Good demo of each.  ?Shan Levans assessed skin break in the Rt axilla and gave pt non-stick gauze for this.  Skin not open, just layer of skin flaked off.   ?Chest press in supine: x20 with cane ?Cervical mechanical traction: 16/5# 60 seconds on/10 off x12 min ?Trigger Point Dry-Needling  ?Treatment instructions: Expect mild to moderate muscle soreness. S/S of pneumothorax if dry needled  over a lung field, and to seek immediate medical attention should they occur. Patient verbalized understanding of these instructions and education. ?  ?Patient Consent Given: Yes ?Education handout provided: Yes ?Muscles treated: Bil upper traps, bil cervical multifidi ?Treatment response/outcome: twitch response ? Skilled palpation and monitoring by PT during dry needling  ?  ? ?  ?  ?PATIENT EDUCATION:  ?Education details: Access Code: [email or web address] ?Person educated: Patient ?Education method: Explanation, Demonstration, and Handouts ?Education comprehension: verbalized understanding and returned  demonstration ?  ?  ?HOME EXERCISE PROGRAM: ?Access Code: [email or web address] ?URL: [email or web address] ?Date: 01/09/2022 ?Prepared by: Claiborne Billings ?  ?Exercises ?Seated Cervical Flexion AROM - 3 x daily - 7 x weekly - 1 sets - 3 reps - 20 hold ?Seated Cervical Sidebending AROM - 3 x daily - 7 x weekly - 1 sets - 3 reps - 20 hold ?Seated Cervical Rotation AROM - 3 x daily - 7 x weekly - 1 sets - 3 reps - 20 hold ?Seated Correct Posture - 1 x daily - 7 x weekly - 3 sets - 10 reps ?Standing Median Nerve Glide - 1 x daily - 7 x weekly - 3 sets - 10 reps ?Median Nerve Rocking - 1 x daily - 7 x weekly - 3 sets - 10 reps ?  ?  ?ASSESSMENT: ?  ?CLINICAL IMPRESSION: ?*** ?  ?  ?OBJECTIVE IMPAIRMENTS decreased activity tolerance, decreased ROM, decreased strength, hypomobility, increased muscle spasms, impaired flexibility, impaired UE functional use, and pain.  ?  ?ACTIVITY LIMITATIONS cleaning, community activity, occupation, and laundry.  ?  ?PERSONAL FACTORS Age and 1 comorbidity: Rt breast cancer   are also affecting patient's functional outcome.  ?  ?  ?REHAB POTENTIAL: Good ?  ?CLINICAL DECISION MAKING: Stable/uncomplicated ?  ?EVALUATION COMPLEXITY: Low ?  ?  ?GOALS: ?Goals reviewed with patient? Yes ?  ?SHORT TERM GOALS: Target date: 02/06/2022 ?  ?Be independent in initial HEP ?Baseline:  ?Goal status: INITIAL ?  ?2.  Verbalize and demonstrate postural modifications for neutral alignment for reduced cervical strain ?Baseline:  ?Goal status: INITIAL ?  ?3.  Report 25% fewer sleep interruptions due to Lt UE pain ?Baseline: waking >5 times a night  ?Goal status: INITIAL ?  ?4.  Report > or = to 30% reduction in frequency and intensity of Lt UE pain to improve independence with home tasks ?Baseline: 5-7/10 constant pain  ?Goal status: INITIAL ?  ?  ?LONG TERM GOALS: Target date: 03/06/2022 ?  ?Be independent in advanced HEP ?Baseline:  ?Goal status: INITIAL ?  ?2.  Improve FOTO > or = to 57 ?Baseline: 43 ?Goal status:  INITIAL ?  ?3.  Sleep with < or = to 2 sleep interruptions due to LBP ?Baseline: waking every hour ?Goal status: INITIAL ?  ?4.  Demonstrate > or = to =30# Lt grip strength to improve  ?Baseline: 15# (Rt is 35#) ?Goal status: INITIAL ?  ?5.  Report > or = to 70% reduction in the frequency and intensity of neck pain and Lt UE radiculopathy to facilitate return to work ?Baseline: 7/10 constant pain ?Goal status: INITIAL ?  ?6.  Demonstrate full Lt shoulder A/ROM to improve functional use with self-care and home tasks  ?Baseline: limited 25%  ?Goal status: INITIAL ?  ?  ?PLAN: ?PT FREQUENCY: 2x/week ?  ?PT DURATION: 8 weeks ?  ?PLANNED INTERVENTIONS: Therapeutic exercises, Therapeutic activity, Neuromuscular re-education, Balance training, Gait training, Patient/Family education, Joint manipulation, Joint mobilization, Dry Needling, Electrical stimulation, Spinal  manipulation, Spinal mobilization, Cryotherapy, Moist heat, Taping, Traction, Ionotophoresis '4mg'$ /ml Dexamethasone, and Manual therapy ?  ?**PLAN FOR NEXT SESSION:  ?  ? ? ? ?Sherol Dade, PT ?01/16/2022, 7:56 PM ? ?  ? ?

## 2022-01-17 ENCOUNTER — Other Ambulatory Visit: Payer: Self-pay | Admitting: Hematology

## 2022-01-17 ENCOUNTER — Ambulatory Visit: Payer: BC Managed Care – PPO | Admitting: Radiation Oncology

## 2022-01-17 ENCOUNTER — Ambulatory Visit: Payer: BC Managed Care – PPO

## 2022-01-17 ENCOUNTER — Encounter: Payer: BC Managed Care – PPO | Admitting: Physical Therapy

## 2022-01-20 ENCOUNTER — Encounter: Payer: BC Managed Care – PPO | Admitting: Rehabilitative and Restorative Service Providers"

## 2022-01-20 ENCOUNTER — Ambulatory Visit: Payer: BC Managed Care – PPO

## 2022-01-21 ENCOUNTER — Ambulatory Visit: Payer: BC Managed Care – PPO

## 2022-01-21 NOTE — Progress Notes (Signed)
? ?                                                                                                                                                          ?  Patient Name: SAMARIE PINDER ?MRN: [id number] ?DOB: Apr 07, 1971 ?Referring Physician: Wenda Low (Profile Not Attached) ?Date of Service: 01/09/2022 ?[facility] [employer] ? ?                                                      End Of Treatment Note ? ?Diagnoses: C50.211-Malignant neoplasm of upper-inner quadrant of right female breast ? ?Cancer Staging: Stage IA, pT1cN0M0 grade 2, ER/PR positive invasive ductal carcinoma of the right breast ? ?Intent: Curative ? ?Radiation Treatment Dates: 12/12/2021 through 01/09/2022 ?Site Technique Total Dose (Gy) Dose per Fx (Gy) Completed Fx Beam Energies  ?Breast, Right: Breast_R 3D 42.56/42.56 2.66 16/16 6X, 10X  ?Breast, Right: Breast_R_Bst specialPort 10/10 2.5 4/4 6E, 9E  ? ?Narrative: The patient tolerated radiation therapy relatively well. She developed fatigue and anticipated skin changes in the treatment field.  ? ?Plan: The patient will receive a call in about one month from the radiation oncology department. She will continue follow up with Dr. Burr Medico as well.  ? ?________________________________________________ ? ? ? ?Carola Rhine, PAC  ?

## 2022-01-22 ENCOUNTER — Encounter: Payer: BC Managed Care – PPO | Admitting: Rehabilitative and Restorative Service Providers"

## 2022-01-22 ENCOUNTER — Ambulatory Visit: Payer: BC Managed Care – PPO

## 2022-01-22 NOTE — Telephone Encounter (Signed)
Called pt regarding her 2 missed PT visits and not having any further scheduled.  Requested a call back if she would like to schedule more or if she does not want any further therapy sessions. ?

## 2022-01-23 ENCOUNTER — Ambulatory Visit: Payer: BC Managed Care – PPO

## 2022-01-24 ENCOUNTER — Ambulatory Visit: Payer: BC Managed Care – PPO

## 2022-01-27 ENCOUNTER — Ambulatory Visit: Payer: BC Managed Care – PPO

## 2022-01-31 ENCOUNTER — Ambulatory Visit: Payer: BC Managed Care – PPO | Admitting: Hematology

## 2022-02-06 DIAGNOSIS — Z6827 Body mass index (BMI) 27.0-27.9, adult: Secondary | ICD-10-CM | POA: Diagnosis not present

## 2022-02-06 DIAGNOSIS — M5412 Radiculopathy, cervical region: Secondary | ICD-10-CM | POA: Diagnosis not present

## 2022-02-10 NOTE — Progress Notes (Signed)
?  Radiation Oncology         [phone] ?________________________________ ? ?Name: Emily Montoya MRN: [id number]  ?Date of Service: 02/17/2022  DOB: Feb 11, 1971 ? ?Post Treatment Telephone Note ? ?Diagnosis:   Stage IA, pT1cN0M0 grade 2, ER/PR positive invasive ductal carcinoma of the right breast ? ?Intent: Curative ? ?Radiation Treatment Dates: 12/12/2021 through 01/09/2022 ?Site Technique Total Dose (Gy) Dose per Fx (Gy) Completed Fx Beam Energies  ?Breast, Right: Breast_R 3D 42.56/42.56 2.66 16/16 6X, 10X  ?Breast, Right: Breast_R_Bst specialPort 10/10 2.5 4/4 6E, 9E  ? ?Narrative: The patient tolerated radiation therapy relatively well. She developed fatigue and anticipated skin changes in the treatment field. She reports her skin is improved and her energy is also coming back. She has some hyperpigmentation still at this time. ? ? ?Impression/Plan: ?1. Stage IA, pT1cN0M0 grade 2, ER/PR positive invasive ductal carcinoma of the right breast. The patient has been doing well since completion of radiotherapy. We discussed that we would be happy to continue to follow her as needed, but she will also continue to follow up with Dr. Burr Medico in medical oncology. She was counseled on skin care as well as measures to avoid sun exposure to this area.  ?2. Survivorship. We discussed the importance of survivorship evaluation and encouraged her to attend her upcoming visit with that clinic. ? ? ? ? ? ?Carola Rhine, PAC  ? ? ? ? ?

## 2022-02-17 ENCOUNTER — Ambulatory Visit
Admission: RE | Admit: 2022-02-17 | Discharge: 2022-02-17 | Disposition: A | Discharge status: Home / Self Care | Payer: BC Managed Care – PPO | Source: Ambulatory Visit | Attending: Radiation Oncology | Admitting: Radiation Oncology

## 2022-02-17 DIAGNOSIS — Z170 Estrogen receptor positive status [ER+]: Secondary | ICD-10-CM | POA: Insufficient documentation

## 2022-02-17 DIAGNOSIS — C50211 Malignant neoplasm of upper-inner quadrant of right female breast: Secondary | ICD-10-CM | POA: Insufficient documentation

## 2022-02-20 ENCOUNTER — Other Ambulatory Visit: Payer: Self-pay | Admitting: Hematology

## 2022-03-04 DIAGNOSIS — M5412 Radiculopathy, cervical region: Secondary | ICD-10-CM | POA: Diagnosis not present

## 2022-03-12 ENCOUNTER — Inpatient Hospital Stay: Payer: BC Managed Care – PPO | Attending: Hematology | Admitting: Hematology

## 2022-03-12 ENCOUNTER — Encounter: Payer: Self-pay | Admitting: Hematology

## 2022-03-12 DIAGNOSIS — Z170 Estrogen receptor positive status [ER+]: Secondary | ICD-10-CM | POA: Diagnosis not present

## 2022-03-12 DIAGNOSIS — C50211 Malignant neoplasm of upper-inner quadrant of right female breast: Secondary | ICD-10-CM | POA: Diagnosis not present

## 2022-03-12 MED ORDER — VENLAFAXINE HCL ER 75 MG PO CP24: 75.0000 mg | ORAL_CAPSULE | Freq: Every day | ORAL | 1 refills | Status: DC

## 2022-03-12 NOTE — Progress Notes (Signed)
?[place]   ?[phone] [phone]   ?Clinic Follow up Note  ? ?Patient Care Team: ?Wenda Low, MD as PCP - General (Internal Medicine) ?[name], MD as Consulting Physician (General Surgery) ?Truitt Merle, MD as Consulting Physician (Hematology) ?Rockwell Germany, RN as Oncology Nurse Navigator ?Mauro Kaufmann, RN as Oncology Nurse Navigator ? ?Date of Service:  03/12/2022 ? ?I connected with Emily Montoya on 03/12/22 at  4:00 PM EDT by telephone and verified that I am speaking with the correct person using two identifiers.  ? ?I discussed the limitations, risks, security and privacy concerns of performing an evaluation and management service by telephone and the availability of in person appointments. I also discussed with the patient that there may be a patient responsible charge related to this service. The patient expressed understanding and agreed to proceed.  ? ?Patient's location:  home  ?Provider's location:  office  ? ?CHIEF COMPLAINT: mood swing  ? ?CURRENT THERAPY:  ?Tamoxifen, started 09/09/21 ? ?ASSESSMENT & PLAN:  ?Emily Montoya is a 51 y.o. premenopausal female with  ? ?Depression and anxiety  ?-started 1-2 months ago, after she started tamoxifen.  Likely medication induced ?-We will stop tamoxifen for now. ?-I recommend her to start Effexor low-dose, and titrate if needed. ?-Social worker referral for counseling, I will also refer her to psychologist Dr. Michail Sermon ? ?1. Malignant neoplasm of upper-inner quadrant of right breast, Stage IA, p(T1c, N0), ER+/PR+/HER2-, Grade 2  ?-presented with palpable right breast lump x6 months. Biopsy 08/20/21 showed IDC, grade 2, Ki67 of 15%. ?-she was started on tamoxifen neoadjuvantly due to heavy menses. ?-right lumpectomy on 10/11/21 under Dr. Marlou Starks showed 1.4 cm invasive and in situ ductal carcinoma. Margins and lymph nodes negative. ?-oncotype RS of 16, low risk. I reviewed the result with her today.  Adjuvant chemo is not recommended  ?-She resumed tamoxifen in early 10/2021. ?-Due to her recent mood swings, I will hold on tamoxifen for now. ?-May consider restart tamoxifen at 10 mg daily on next visit and months ?  ?2. Genetic testing ?-given her young age, she qualifies for genetic testing.  ?-testing on 09/05/21 was negative. ?  ?  ?PLAN:  ?-Hold tamoxifen ?-I called in Effexor 25 mg daily ?-Social work referral, also refer her to psychologist Dr. Michail Sermon for her mood swings ?-She has a biopsy scheduled in a month.  If she is doing well on next visit, may restart tamoxifen at 10 mg daily ? ? ?No problem-specific Assessment & Plan notes found for this encounter. ? ? ?SUMMARY OF ONCOLOGIC HISTORY: ?Oncology History Overview Note  ? Cancer Staging  ?Malignant neoplasm of upper-inner quadrant of right breast in female, estrogen receptor positive ([place]) ?Staging form: Breast, AJCC 8th Edition ?- Clinical stage from 08/20/2021: Stage IA (cT1c, cN0, cM0, G2, ER+, PR+, HER2-) - Signed by Truitt Merle, MD on 09/02/2021 ?Method of lymph node assessment: Clinical ?Histologic grading system: 3 grade system ?- Pathologic stage from 10/11/2021: Stage IA (pT1c, pN0, cM0, G2, ER+, PR+, HER2-, Oncotype DX score: 16) - Signed by Truitt Merle, MD on 01/08/2022 ?Stage prefix: Initial diagnosis ?Multigene prognostic tests performed: Oncotype DX ?Recurrence score range: Greater than or equal to 11 ?Histologic grading system: 3 grade system ? ? ?  ?Malignant neoplasm of upper-inner quadrant of right breast in female, estrogen receptor positive ([place])  ?08/14/2021 Mammogram  ? EXAM: ?DIGITAL DIAGNOSTIC BILATERAL MAMMOGRAM WITH TOMOSYNTHESIS AND CAD; ?ULTRASOUND RIGHT BREAST LIMITED ? ?IMPRESSION: ?1.  Suspicious right breast mass at the 1 o'clock position 15 cm from the nipple on the right. It measures 1.1 x 1.3 x 0.9 cm. Recommendation is for ultrasound-guided biopsy. ?2. No suspicious right axillary lymphadenopathy. ?3. No mammographic  evidence of malignancy on the left. ?  ?08/20/2021 Pathology Results  ? Diagnosis ?Breast, right, needle core biopsy, 1 o'clock, 15 cmfn, ribbon clip ?- INVASIVE DUCTAL CARCINOMA ?- SEE COMMENT ? ?Microscopic Comment ?Based on the biopsy, the carcinoma appears Nottingham grade 2 of 3 and measures 1.2 cm in greatest linear extent. ? ?PROGNOSTIC INDICATORS ?Results: ?The tumor cells are NEGATIVE for Her2 (1+). ?Estrogen Receptor: 90%, POSITIVE, MODERATE STAINNG INTENSITY ?Progesterone Receptor: 90%, POSITIVE, STRONG STAINING INTENSITY ?Proliferation Marker Ki67: 15% ?  ?08/20/2021 Cancer Staging  ? Staging form: Breast, AJCC 8th Edition ?- Clinical stage from 08/20/2021: Stage IA (cT1c, cN0, cM0, G2, ER+, PR+, HER2-) - Signed by Truitt Merle, MD on 09/02/2021 ?Method of lymph node assessment: Clinical ?Histologic grading system: 3 grade system ? ?  ?08/29/2021 Initial Diagnosis  ? Malignant neoplasm of upper-inner quadrant of right breast in female, estrogen receptor positive ([place]) ?  ?09/22/2021 Genetic Testing  ? Negative genetic testing on the CancerNext-Expanded+RNAinsight panel.  The report date is September 20, 2021. ? ?The CancerNext-Expanded gene panel offered by [facility] and includes sequencing and rearrangement analysis for the following 77 genes: AIP, ALK, APC*, ATM*, AXIN2, BAP1, BARD1, BLM, BMPR1A, BRCA1*, BRCA2*, BRIP1*, CDC73, CDH1*, CDK4, CDKN1B, CDKN2A, CHEK2*, CTNNA1, DICER1, FANCC, FH, FLCN, GALNT12, KIF1B, LZTR1, MAX, MEN1, MET, MLH1*, MSH2*, MSH3, MSH6*, MUTYH*, NBN, NF1*, NF2, NTHL1, PALB2*, PHOX2B, PMS2*, POT1, PRKAR1A, PTCH1, PTEN*, RAD51C*, RAD51D*, RB1, RECQL, RET, SDHA, SDHAF2, SDHB, SDHC, SDHD, SMAD4, SMARCA4, SMARCB1, SMARCE1, STK11, SUFU, TMEM127, TP53*, TSC1, TSC2, VHL and XRCC2 (sequencing and deletion/duplication); EGFR, EGLN1, HOXB13, KIT, MITF, PDGFRA, POLD1, and POLE (sequencing only); EPCAM and GREM1 (deletion/duplication only). DNA and RNA analyses performed for * genes. ?   ?10/11/2021 Cancer Staging  ? Staging form: Breast, AJCC 8th Edition ?- Pathologic stage from 10/11/2021: Stage IA (pT1c, pN0, cM0, G2, ER+, PR+, HER2-, Oncotype DX score: 16) - Signed by Truitt Merle, MD on 01/08/2022 ?Stage prefix: Initial diagnosis ?Multigene prognostic tests performed: Oncotype DX ?Recurrence score range: Greater than or equal to 11 ?Histologic grading system: 3 grade system ? ?  ?10/11/2021 Definitive Surgery  ? FINAL MICROSCOPIC DIAGNOSIS:  ? ?A. LYMPH NODE, RIGHT AXILLARY #1, SENTINEL, EXCISION:  ?- One lymph node negative for metastatic carcinoma (0/1).  ? ?B. LYMPH NODE, RIGHT AXILLARY, SENTINEL, EXCISION:  ?- One lymph node negative for metastatic carcinoma (0/1).  ? ?C. LYMPH NODE, RIGHT AXILLARY, SENTINEL, EXCISION:  ?- One lymph node negative for metastatic carcinoma (0/1).  ? ?D. LYMPH NODE, RIGHT AXILLARY #2, SENTINEL, EXCISION:  ?- One lymph node negative for metastatic carcinoma (0/1).  ? ?E. LYMPH NODE, RIGHT AXILLARY, SENTINEL, EXCISION:  ?- One lymph node negative for metastatic carcinoma (0/1).  ? ?F. BREAST, RIGHT, LUMPECTOMY:  ?- Invasive and in situ ductal carcinoma, 1.4 cm.  ?- Margins negative for carcinoma.  ?- Biopsy site and biopsy clip.  ?- See oncology table.  ?  ?10/11/2021 Oncotype testing  ? Oncotype DX was obtained on the final surgical sample and the recurrence score of 16 predicts a risk of recurrence outside the breast over the next 9 years of 4%, if the patient's only systemic therapy is an antiestrogen for 5 years.  It also predicts no significant benefit from chemotherapy. ? ?  ? ? ? ?  INTERVAL HISTORY:  ?Emily Montoya called last week for her concern of mood swing.  She is scheduled for a phone visit to discuss further.  She has noticed irritability, loss of interest, and a depressed mood in the past few months.  She returned to work 2 weeks ago, but has difficulty concentration.  She sometimes also feels anxious, not been sleeping well lately.  She denies  suicidal ideas.  Has been compliant with tamoxifen, mild hot flash, no other noticeable side effects. ? ?MEDICAL HISTORY:  ?Past Medical History:  ?Diagnosis Date  ? Breast cancer ([place]) 08/20/2021  ? Sickle cell trait

## 2022-03-13 ENCOUNTER — Other Ambulatory Visit: Payer: Self-pay

## 2022-03-13 ENCOUNTER — Encounter: Payer: Self-pay | Admitting: Licensed Clinical Social Worker

## 2022-03-13 DIAGNOSIS — Z170 Estrogen receptor positive status [ER+]: Secondary | ICD-10-CM

## 2022-03-13 NOTE — Progress Notes (Signed)
[place] [facility]  Initial Assessment   Emily Montoya is a 51 y.o. year old female contacted by phone. Clinical Social Work was referred by medical provider for assessment of psychosocial needs.   SDOH (Social Determinants of Health) assessments performed: Yes   SDOH Screenings   Alcohol Screen: Not on file  Depression (PHQ2-9): Not on file  Financial Resource Strain: Not on file  Food Insecurity: Not on file  Housing: Not on file  Physical Activity: Not on file  Social Connections: Not on file  Stress: Not on file  Tobacco Use: Medium Risk   Smoking Tobacco Use: Former   Smokeless Tobacco Use: Never   Passive Exposure: Not on file  Transportation Needs: Not on file     Distress Screen completed: No    12/04/2021    9:21 AM  ONCBCN DISTRESS SCREENING  Distress experienced in past week (1-10) 2  Emotional problem type Nervousness/Anxiety;Adjusting to illness      Family/Social Information:  Housing Arrangement: patient lives with adult daughter and her 2  boys. Family members/support persons in your life? Pt is closest to and most open to her daughter; however, does have other immediate family residing close by who are available for support. 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  3 years ago pt accepted a job which allowed her to work from home, and was able to return to working from home 2 weeks ago.  Income source: Employment Financial concerns: Yes, due to illness and/or loss of work during treatment Type of concern: None Food access concerns: no Religious or spiritual practice:  prior to diagnosis pt reports actively attending church; however, since diagnosis pt has not been as it is overwhelming to think about navigating all of the questions regarding her diagnosis. Services Currently in place:  none  Coping/ Adjustment to diagnosis: Patient understands treatment plan  and what happens next? yes Concerns about diagnosis and/or treatment: Afraid of cancer Patient reported stressors: Depression Hopes and/or priorities: Pt's priority is to address the mood swings and depression that has occurred since starting tamoxifen w/ the hope of being able to continue prescribed treatment w/o depression. Patient enjoys time with family/ friends Current coping skills/ strengths: Other: Pt describes herself as the "caretaker" for the rest of the family due to her strength and ability to remain calm and handle difficult situations.    SUMMARY: Current SDOH Barriers:  Mental Health Concerns   Clinical Social Work Clinical Goal(s):  Pt has been instructed to stop taking tamoxifen and has started an anti-depressant.  CSW goal is to emotionally support pt as appropriate while pt addresses depression.  Interventions: Discussed Montoya feeling and emotions when being diagnosed with cancer, and the importance of support during treatment Informed patient of the support team roles and support services at [facility] Provided CSW contact information and encouraged patient to call with any questions or concerns Pt to follow w/ Dr. Michail Sermon w/ regards to the anti-depressant that has been started.  CSW discussed isolation at length w/ pt as she has not left the house much or gone to church/gotten together w/ friends since surgery.  Pt encouraged to try to engage socially and to reach out for counseling should it be determined it may be beneficial.     Follow Up Plan: CSW will follow-up with patient by phone in two weeks. Patient verbalizes understanding of plan: Yes    Henriette Combs,  LCSW

## 2022-03-13 NOTE — Progress Notes (Signed)
Epic faxed behavioral health referral to Dr. Conception Chancy at [facility] [phone]).  Epic fax confirmation received.

## 2022-03-26 DIAGNOSIS — M5412 Radiculopathy, cervical region: Secondary | ICD-10-CM | POA: Diagnosis not present

## 2022-04-04 ENCOUNTER — Telehealth: Payer: Self-pay | Admitting: *Deleted

## 2022-04-08 ENCOUNTER — Telehealth: Payer: Self-pay

## 2022-04-08 NOTE — Telephone Encounter (Signed)
This nurse reached out to patient to complete survivorship assessment for appointment on 6/14.  No answer, Left a message with appointment reminder,  Patient knows to call scheduling to reschedule if she is unable to attend. No further concerns at this time.

## 2022-04-09 ENCOUNTER — Inpatient Hospital Stay: Payer: BC Managed Care – PPO | Attending: Nurse Practitioner | Admitting: Nurse Practitioner

## 2022-04-09 NOTE — Progress Notes (Incomplete)
CLINIC:  Survivorship   REASON FOR VISIT:  Routine follow-up post-treatment for a recent history of breast cancer.  BRIEF ONCOLOGIC HISTORY:  Oncology History Overview Note   Cancer Staging  Malignant neoplasm of upper-inner quadrant of right breast in female, estrogen receptor positive (HCC) Staging form: Breast, AJCC 8th Edition - Clinical stage from 08/20/2021: Stage IA (cT1c, cN0, cM0, G2, ER+, PR+, HER2-) - Signed by Malachy Mood, MD on 09/02/2021 Method of lymph node assessment: Clinical Histologic grading system: 3 grade system - Pathologic stage from 10/11/2021: Stage IA (pT1c, pN0, cM0, G2, ER+, PR+, HER2-, Oncotype DX score: 16) - Signed by Malachy Mood, MD on 01/08/2022 Stage prefix: Initial diagnosis Multigene prognostic tests performed: Oncotype DX Recurrence score range: Greater than or equal to 11 Histologic grading system: 3 grade system     Malignant neoplasm of upper-inner quadrant of right breast in female, estrogen receptor positive (HCC)  08/14/2021 Mammogram   EXAM: DIGITAL DIAGNOSTIC BILATERAL MAMMOGRAM WITH TOMOSYNTHESIS AND CAD; ULTRASOUND RIGHT BREAST LIMITED  IMPRESSION: 1. Suspicious right breast mass at the 1 o'clock position 15 cm from the nipple on the right. It measures 1.1 x 1.3 x 0.9 cm. Recommendation is for ultrasound-guided biopsy. 2. No suspicious right axillary lymphadenopathy. 3. No mammographic evidence of malignancy on the left.   08/20/2021 Pathology Results   Diagnosis Breast, right, needle core biopsy, 1 o'clock, 15 cmfn, ribbon clip - INVASIVE DUCTAL CARCINOMA - SEE COMMENT  Microscopic Comment Based on the biopsy, the carcinoma appears Nottingham grade 2 of 3 and measures 1.2 cm in greatest linear extent.  PROGNOSTIC INDICATORS Results: The tumor cells are NEGATIVE for Her2 (1+). Estrogen Receptor: 90%, POSITIVE, MODERATE STAINNG INTENSITY Progesterone Receptor: 90%, POSITIVE, STRONG STAINING INTENSITY Proliferation Marker  Ki67: 15%   08/20/2021 Cancer Staging   Staging form: Breast, AJCC 8th Edition - Clinical stage from 08/20/2021: Stage IA (cT1c, cN0, cM0, G2, ER+, PR+, HER2-) - Signed by Malachy Mood, MD on 09/02/2021 Method of lymph node assessment: Clinical Histologic grading system: 3 grade system   08/29/2021 Initial Diagnosis   Malignant neoplasm of upper-inner quadrant of right breast in female, estrogen receptor positive (HCC)   09/22/2021 Genetic Testing   Negative genetic testing on the CancerNext-Expanded+RNAinsight panel.  The report date is September 20, 2021.  The CancerNext-Expanded gene panel offered by [facility] and includes sequencing and rearrangement analysis for the following 77 genes: AIP, ALK, APC*, ATM*, AXIN2, BAP1, BARD1, BLM, BMPR1A, BRCA1*, BRCA2*, BRIP1*, CDC73, CDH1*, CDK4, CDKN1B, CDKN2A, CHEK2*, CTNNA1, DICER1, FANCC, FH, FLCN, GALNT12, KIF1B, LZTR1, MAX, MEN1, MET, MLH1*, MSH2*, MSH3, MSH6*, MUTYH*, NBN, NF1*, NF2, NTHL1, PALB2*, PHOX2B, PMS2*, POT1, PRKAR1A, PTCH1, PTEN*, RAD51C*, RAD51D*, RB1, RECQL, RET, SDHA, SDHAF2, SDHB, SDHC, SDHD, SMAD4, SMARCA4, SMARCB1, SMARCE1, STK11, SUFU, TMEM127, TP53*, TSC1, TSC2, VHL and XRCC2 (sequencing and deletion/duplication); EGFR, EGLN1, HOXB13, KIT, MITF, PDGFRA, POLD1, and POLE (sequencing only); EPCAM and GREM1 (deletion/duplication only). DNA and RNA analyses performed for * genes.   10/11/2021 Cancer Staging   Staging form: Breast, AJCC 8th Edition - Pathologic stage from 10/11/2021: Stage IA (pT1c, pN0, cM0, G2, ER+, PR+, HER2-, Oncotype DX score: 16) - Signed by Malachy Mood, MD on 01/08/2022 Stage prefix: Initial diagnosis Multigene prognostic tests performed: Oncotype DX Recurrence score range: Greater than or equal to 11 Histologic grading system: 3 grade system   10/11/2021 Definitive Surgery   FINAL MICROSCOPIC DIAGNOSIS:   A. LYMPH NODE, RIGHT AXILLARY #1, SENTINEL, EXCISION:  - One lymph node negative for metastatic  carcinoma (0/1).   B. LYMPH NODE, RIGHT AXILLARY, SENTINEL, EXCISION:  - One lymph node negative for metastatic carcinoma (0/1).   C. LYMPH NODE, RIGHT AXILLARY, SENTINEL, EXCISION:  - One lymph node negative for metastatic carcinoma (0/1).   D. LYMPH NODE, RIGHT AXILLARY #2, SENTINEL, EXCISION:  - One lymph node negative for metastatic carcinoma (0/1).   E. LYMPH NODE, RIGHT AXILLARY, SENTINEL, EXCISION:  - One lymph node negative for metastatic carcinoma (0/1).   F. BREAST, RIGHT, LUMPECTOMY:  - Invasive and in situ ductal carcinoma, 1.4 cm.  - Margins negative for carcinoma.  - Biopsy site and biopsy clip.  - See oncology table.    10/11/2021 Oncotype testing   Oncotype DX was obtained on the final surgical sample and the recurrence score of 16 predicts a risk of recurrence outside the breast over the next 9 years of 4%, if the patient's only systemic therapy is an antiestrogen for 5 years.  It also predicts no significant benefit from chemotherapy.      INTERVAL HISTORY:  Emily Montoya presents to the [facility] today for our initial meeting to review her survivorship care plan detailing her treatment course for breast cancer, as well as monitoring long-term side effects of that treatment, education regarding health maintenance, screening, and overall wellness and health promotion.     Overall, Emily Montoya reports feeling quite well since completing her radiation therapy approximately 3 months ago.  She ***    REVIEW OF SYSTEMS:  Review of Systems - Oncology Breast: Denies any new nodularity, masses, tenderness, nipple changes, or nipple discharge.      ONCOLOGY TREATMENT TEAM:  1. Surgeon:  Dr. Marland Kitchen at [facility] 2. Medical Oncologist: Dr. Marland Kitchen  3. Radiation Oncologist: Dr. Marland Kitchen    PAST MEDICAL/SURGICAL HISTORY:  Past Medical History:  Diagnosis Date  . Breast cancer ([place]) 08/20/2021  . Sickle cell trait [facility])    Past Surgical History:   Procedure Laterality Date  . BREAST LUMPECTOMY WITH RADIOACTIVE SEED AND SENTINEL LYMPH NODE BIOPSY Right 10/11/2021   Procedure: RIGHT BREAST LUMPECTOMY WITH RADIOACTIVE SEED LOCALIZATION AND SENTINEL LYMPH NODE BIOPSY;  Surgeon: [name], MD;  Location: [facility];  Service: General;  Laterality: Right;  . TUBAL LIGATION       ALLERGIES:  Allergies  Allergen Reactions  . Meloxicam Swelling     CURRENT MEDICATIONS:  Outpatient Encounter Medications as of 04/09/2022  Medication Sig  . tamoxifen (NOLVADEX) 20 MG tablet TAKE 1 TABLET(20 MG) BY MOUTH DAILY. STOP TAKING 1 WEEK BEFORE SURGERY  . tiZANidine (ZANAFLEX) 4 MG tablet Take 1 tablet (4 mg total) by mouth every 8 (eight) hours as needed for muscle spasms.  Marland Kitchen venlafaxine XR (EFFEXOR-XR) 75 MG 24 hr capsule Take 1 capsule (75 mg total) by mouth daily with breakfast.   No facility-administered encounter medications on file as of 04/09/2022.     ONCOLOGIC FAMILY HISTORY:  Family History  Problem Relation Age of Onset  . HIV/AIDS Mother      GENETIC COUNSELING/TESTING: ***  SOCIAL HISTORY:  Emily Montoya is /single/married/divorced/widowed/separated and lives alone/with her spouse/family/friend in (city), [place].  She has (#) children and they live in (city).  Emily Montoya is currently retired/disabled/working part-time/full-time as ***.  She denies any current or history of tobacco, alcohol, or illicit drug use.     PHYSICAL EXAMINATION:  Vital Signs:  There were no vitals filed for this visit. There were no vitals filed for  this visit. General: Well-nourished, well-appearing female in no acute distress.  She is unaccompanied/accompanied in clinic by her ***** today.   HEENT: Head is normocephalic.  Pupils equal and reactive to light. Conjunctivae clear without exudate.  Sclerae anicteric. Oral mucosa is pink, moist.  Oropharynx is pink without lesions or erythema.  Lymph: No cervical,  supraclavicular, or infraclavicular lymphadenopathy noted on palpation.  Cardiovascular: Regular rate and rhythm.Marland Kitchen Respiratory: Clear to auscultation bilaterally. Chest expansion symmetric; breathing non-labored.  GI: Abdomen soft and round; non-tender, non-distended. Bowel sounds normoactive.  GU: Deferred.  Neuro: No focal deficits. Steady gait.  Psych: Mood and affect normal and appropriate for situation.  Extremities: No edema. MSK: No focal spinal tenderness to palpation.  Full range of motion in bilateral upper extremities Skin: Warm and dry.  LABORATORY DATA:  None for this visit.  DIAGNOSTIC IMAGING:  None for this visit.      ASSESSMENT AND PLAN:  Ms.. Montoya is a pleasant 51 y.o. female with Stage *** right/left breast invasive ductal carcinoma, ER+/PR+/HER2-, diagnosed in (date), treated with lumpectomy, adjuvant radiation therapy, and anti-estrogen therapy with *** beginning in (date).  She presents to the Survivorship Clinic for our initial meeting and routine follow-up post-completion of treatment for breast cancer.    1. Stage *** right/left breast cancer:  Emily Montoya is continuing to recover from definitive treatment for breast cancer. She will follow-up with her medical oncologist, Dr. Ross Ludwig in (month) /2017 with history and physical exam per surveillance protocol.  She will continue her anti-estrogen therapy with (drug). Thus far, she is tolerating the *** well, with minimal side effects. She was instructed to make Dr. Lindi Adie or myself aware if she begins to experience any worsening side effects of the medication and I could see her back in clinic to help manage those side effects, as needed. Though the incidence is low, there is an associated risk of endometrial cancer with anti-estrogen therapies like Tamoxifen.  Emily Montoya was encouraged to contact Dr. Carrington Clamp or myself with any vaginal bleeding while taking Tamoxifen. Other side effects of  Tamoxifen were again reviewed with her as well. Today, a comprehensive survivorship care plan and treatment summary was reviewed with the patient today detailing her breast cancer diagnosis, treatment course, potential late/long-term effects of treatment, appropriate follow-up care with recommendations for the future, and patient education resources.  A copy of this summary, along with a letter will be sent to the patient's primary care provider via mail/fax/In Basket message after today's visit.    #. Problem(s) at Visit______________  #. Bone health:  Given Emily Montoya's age/history of breast cancer and her current treatment regimen including anti-estrogen therapy with _______, she is at risk for bone demineralization.  Her last DEXA scan was **/**/20**, which showed (results).***  In the meantime, she was encouraged to increase her consumption of foods rich in calcium, as well as increase her weight-bearing activities.  She was given education on specific activities to promote bone health.  #. Cancer screening:  Due to Emily Montoya's history and her age, she should receive screening for skin cancers, colon cancer, and gynecologic cancers.  The information and recommendations are listed on the patient's comprehensive care plan/treatment summary and were reviewed in detail with the patient.    #. Health maintenance and wellness promotion: Emily Montoya was encouraged to consume 5-7 servings of fruits and vegetables per day. We reviewed the "Nutrition Rainbow" handout, as well as the handout "Take Control of Your Health and Reduce Your  Cancer Risk" from the [employer].  She was also encouraged to engage in moderate to vigorous exercise for 30 minutes per day most days of the week. We discussed the [employer] fitness program, which is designed for cancer survivors to help them become more physically fit after cancer treatments.  She was instructed to limit her alcohol consumption and continue  to abstain from tobacco use/***was encouraged stop smoking.     #. Support services/counseling: It is not uncommon for this period of the patient's cancer care trajectory to be one of many emotions and stressors.  We discussed an opportunity for her to participate in the next session of [facility] ("Finding Your New Normal") support group series designed for patients after they have completed treatment.   Emily Montoya was encouraged to take advantage of our many other support services programs, support groups, and/or counseling in coping with her new life as a cancer survivor after completing anti-cancer treatment.  She was offered support today through active listening and expressive supportive counseling.  She was given information regarding our available services and encouraged to contact me with any questions or for help enrolling in any of our support group/programs.    Dispo:   -Return to cancer center ***  -Mammogram due in *** -Follow up with surgery *** -She is welcome to return back to the Survivorship Clinic at any time; no additional follow-up needed at this time.  -Consider referral back to survivorship as a long-term survivor for continued surveillance  A total of (30) minutes of face-to-face time was spent with this patient with greater than 50% of that time in counseling and care-coordination.   Cira Rue, NP Survivorship Program [facility] [phone]   Note: PRIMARY CARE PROVIDER Wenda Low, [place] [phone]

## 2022-04-15 DIAGNOSIS — Z170 Estrogen receptor positive status [ER+]: Secondary | ICD-10-CM | POA: Diagnosis not present

## 2022-04-15 DIAGNOSIS — C50211 Malignant neoplasm of upper-inner quadrant of right female breast: Secondary | ICD-10-CM | POA: Diagnosis not present

## 2022-04-21 ENCOUNTER — Ambulatory Visit: Payer: BC Managed Care – PPO | Attending: General Surgery

## 2022-04-21 VITALS — Wt 163.4 lb

## 2022-04-21 DIAGNOSIS — Z483 Aftercare following surgery for neoplasm: Secondary | ICD-10-CM | POA: Insufficient documentation

## 2022-04-25 ENCOUNTER — Ambulatory Visit (INDEPENDENT_AMBULATORY_CARE_PROVIDER_SITE_OTHER): Payer: BC Managed Care – PPO | Admitting: Psychologist

## 2022-04-25 DIAGNOSIS — F4521 Hypochondriasis: Secondary | ICD-10-CM | POA: Diagnosis not present

## 2022-04-25 NOTE — Plan of Care (Signed)

## 2022-04-25 NOTE — Progress Notes (Signed)
[place] Counselor Initial Adult Exam  Name: Emily Montoya Date: 04/25/2022 MRN: [id number] DOB: Mar 27, 1971 PCP: Emily Low, MD  Time spent: 8:07 am to 8:40 am; total time: 33 minutes  This session was held via video webex teletherapy due to the coronavirus risk at this time. The patient consented to video teletherapy and was located at her home during this session. She is aware it is the responsibility of the patient to secure confidentiality on her end of the session. The provider was in a private home office for the duration of this session. Limits of confidentiality were discussed with the patient.   Guardian/Payee:  NA    Paperwork requested: No   Reason for Visit /Presenting Problem: Anxiety  Mental Status Exam: Appearance:   Well Groomed     Behavior:  Appropriate  Motor:  Normal  Speech/Language:   Clear and Coherent  Affect:  Appropriate  Mood:  normal  Thought process:  normal  Thought content:    WNL  Sensory/Perceptual disturbances:    WNL  Orientation:  oriented to person, place, and time/date  Attention:  Good  Concentration:  Good  Memory:  WNL  Fund of knowledge:   Good  Insight:    Good  Judgment:   Good  Impulse Control:  Good     Reported Symptoms:  The patient endorsed experiencing the following: preoccupation with acquiring a serious illness, a high level of anxiety over health, and looks for signs of a serious health condition. The patient denied suicidal and homicidal ideation.   Risk Assessment: Danger to Self:  No Self-injurious Behavior: No Danger to Others: No Duty to Warn:no Physical Aggression / Violence:No  Access to Firearms a concern: No  Gang Involvement:No  Patient / guardian was educated about steps to take if suicide or homicide risk level increases between visits: n/a While future psychiatric events cannot be accurately predicted, the patient does not currently require acute inpatient psychiatric care and  does not currently meet [address] involuntary commitment criteria.  Substance Abuse History: Current substance abuse: No     Past Psychiatric History:   No previous psychological problems have been observed Outpatient Providers:NA History of Psych Hospitalization: No  Psychological Testing:  na    Abuse History:  Victim of: No.,  NA    Report needed: No. Victim of Neglect:No. Perpetrator of  na   Witness / Exposure to Domestic Violence: No   Protective Services Involvement: No  Witness to [employer] Violence:  No   Family History:  Family History  Problem Relation Age of Onset   HIV/AIDS Mother     Living situation: the patient lives with their family  Sexual Orientation: Straight  Relationship Status: divorced . Patient is currently in an on again and off again relationship with her ex-husband named Emily Montoya.  Name of spouse / other:Emily Montoya If a parent, number of children / ages:Patient has a 49 year old daughter and two grandchildren. She also has a son who is in college  Support Systems: Family  Financial Stress:  No   Income/Employment/Disability: Employment  Armed forces logistics/support/administrative officer: No   Educational History: Education: Scientist, product/process development: NA  Any cultural differences that may affect / interfere with treatment:  not applicable   Recreation/Hobbies: Being with family  Stressors: Health problems    Strengths: Supportive Relationships  Barriers:  NA   Legal History: Pending legal issue / charges: The patient has no significant history of legal issues. History of legal issue /  charges: DUI  Medical History/Surgical History: reviewed Past Medical History:  Diagnosis Date   Breast cancer ([place]) 08/20/2021   Sickle cell trait ([place])     Past Surgical History:  Procedure Laterality Date   BREAST LUMPECTOMY WITH RADIOACTIVE SEED AND SENTINEL LYMPH NODE BIOPSY Right 10/11/2021   Procedure: RIGHT BREAST LUMPECTOMY WITH  RADIOACTIVE SEED LOCALIZATION AND SENTINEL LYMPH NODE BIOPSY;  Surgeon: [name], MD;  Location: [facility];  Service: General;  Laterality: Right;   TUBAL LIGATION      Medications: Current Outpatient Medications  Medication Sig Dispense Refill   tamoxifen (NOLVADEX) 20 MG tablet TAKE 1 TABLET(20 MG) BY MOUTH DAILY. STOP TAKING 1 WEEK BEFORE SURGERY 30 tablet 1   tiZANidine (ZANAFLEX) 4 MG tablet Take 1 tablet (4 mg total) by mouth every 8 (eight) hours as needed for muscle spasms. 40 tablet 1   venlafaxine XR (EFFEXOR-XR) 75 MG 24 hr capsule Take 1 capsule (75 mg total) by mouth daily with breakfast. 30 capsule 1   No current facility-administered medications for this visit.    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 The patient lives at home with her daughter, two grandchildren, and a dog. 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 The patient denied suicidal and homicidal ideation.   The patient stated that she wants coping skills, to process emotions, and to process how her body has changed as a result of the cancer treatment.   This psychologist makes the recommendation that the patient participate in at least monthly counseling. If possible bi-weekly.    Conception Chancy, PsyD

## 2022-04-25 NOTE — Progress Notes (Signed)
                Naasir Carreira, PsyD 

## 2022-04-30 ENCOUNTER — Other Ambulatory Visit: Payer: Self-pay | Admitting: Hematology

## 2022-05-05 ENCOUNTER — Inpatient Hospital Stay: Payer: BC Managed Care – PPO | Attending: Nurse Practitioner | Admitting: Nurse Practitioner

## 2022-05-05 ENCOUNTER — Other Ambulatory Visit: Payer: Self-pay

## 2022-05-05 ENCOUNTER — Encounter: Payer: Self-pay | Admitting: Nurse Practitioner

## 2022-05-05 VITALS — BP 116/71 | HR 69 | Temp 98.5°F | Resp 15 | Wt 163.1 lb

## 2022-05-05 DIAGNOSIS — Z79810 Long term (current) use of selective estrogen receptor modulators (SERMs): Secondary | ICD-10-CM | POA: Insufficient documentation

## 2022-05-05 DIAGNOSIS — Z170 Estrogen receptor positive status [ER+]: Secondary | ICD-10-CM | POA: Insufficient documentation

## 2022-05-05 DIAGNOSIS — C50211 Malignant neoplasm of upper-inner quadrant of right female breast: Secondary | ICD-10-CM | POA: Diagnosis not present

## 2022-05-05 DIAGNOSIS — Z1211 Encounter for screening for malignant neoplasm of colon: Secondary | ICD-10-CM | POA: Diagnosis not present

## 2022-05-05 DIAGNOSIS — Z87891 Personal history of nicotine dependence: Secondary | ICD-10-CM | POA: Insufficient documentation

## 2022-05-05 MED ORDER — TAMOXIFEN CITRATE 10 MG PO TABS: ORAL_TABLET | ORAL | 3 refills | Status: DC

## 2022-05-05 NOTE — Progress Notes (Signed)
CLINIC:  Survivorship   Patient Care Team: Wenda Low, MD as PCP - General (Internal Medicine) [name], MD as Consulting Physician (General Surgery) Truitt Merle, MD as Consulting Physician (Hematology) Rockwell Germany, RN as Oncology Nurse Navigator Mauro Kaufmann, RN as Oncology Nurse Navigator Alla Feeling, NP as Nurse Practitioner (Nurse Practitioner)   REASON FOR VISIT:  Routine follow-up post-treatment for a recent history of breast cancer.  BRIEF ONCOLOGIC HISTORY:  Oncology History Overview Note   Cancer Staging  Malignant neoplasm of upper-inner quadrant of right breast in female, estrogen receptor positive ([place]) Staging form: Breast, AJCC 8th Edition - Clinical stage from 08/20/2021: Stage IA (cT1c, cN0, cM0, G2, ER+, PR+, HER2-) - Signed by Truitt Merle, MD on 09/02/2021 Method of lymph node assessment: Clinical Histologic grading system: 3 grade system - Pathologic stage from 10/11/2021: Stage IA (pT1c, pN0, cM0, G2, ER+, PR+, HER2-, Oncotype DX score: 16) - Signed by Truitt Merle, MD on 01/08/2022 Stage prefix: Initial diagnosis Multigene prognostic tests performed: Oncotype DX Recurrence score range: Greater than or equal to 11 Histologic grading system: 3 grade system     Malignant neoplasm of upper-inner quadrant of right breast in female, estrogen receptor positive ([place])  08/14/2021 Mammogram   EXAM: DIGITAL DIAGNOSTIC BILATERAL MAMMOGRAM WITH TOMOSYNTHESIS AND CAD; ULTRASOUND RIGHT BREAST LIMITED  IMPRESSION: 1. Suspicious right breast mass at the 1 o'clock position 15 cm from the nipple on the right. It measures 1.1 x 1.3 x 0.9 cm. Recommendation is for ultrasound-guided biopsy. 2. No suspicious right axillary lymphadenopathy. 3. No mammographic evidence of malignancy on the left.   08/20/2021 Pathology Results   Diagnosis Breast, right, needle core biopsy, 1 o'clock, 15 cmfn, ribbon clip - INVASIVE DUCTAL CARCINOMA - SEE COMMENT  Microscopic  Comment Based on the biopsy, the carcinoma appears Nottingham grade 2 of 3 and measures 1.2 cm in greatest linear extent.  PROGNOSTIC INDICATORS Results: The tumor cells are NEGATIVE for Her2 (1+). Estrogen Receptor: 90%, POSITIVE, MODERATE STAINNG INTENSITY Progesterone Receptor: 90%, POSITIVE, STRONG STAINING INTENSITY Proliferation Marker Ki67: 15%   08/20/2021 Cancer Staging   Staging form: Breast, AJCC 8th Edition - Clinical stage from 08/20/2021: Stage IA (cT1c, cN0, cM0, G2, ER+, PR+, HER2-) - Signed by Truitt Merle, MD on 09/02/2021 Method of lymph node assessment: Clinical Histologic grading system: 3 grade system   08/29/2021 Initial Diagnosis   Malignant neoplasm of upper-inner quadrant of right breast in female, estrogen receptor positive ([place])   09/22/2021 Genetic Testing   Negative genetic testing on the CancerNext-Expanded+RNAinsight panel.  The report date is September 20, 2021.  The CancerNext-Expanded gene panel offered by [facility] and includes sequencing and rearrangement analysis for the following 77 genes: AIP, ALK, APC*, ATM*, AXIN2, BAP1, BARD1, BLM, BMPR1A, BRCA1*, BRCA2*, BRIP1*, CDC73, CDH1*, CDK4, CDKN1B, CDKN2A, CHEK2*, CTNNA1, DICER1, FANCC, FH, FLCN, GALNT12, KIF1B, LZTR1, MAX, MEN1, MET, MLH1*, MSH2*, MSH3, MSH6*, MUTYH*, NBN, NF1*, NF2, NTHL1, PALB2*, PHOX2B, PMS2*, POT1, PRKAR1A, PTCH1, PTEN*, RAD51C*, RAD51D*, RB1, RECQL, RET, SDHA, SDHAF2, SDHB, SDHC, SDHD, SMAD4, SMARCA4, SMARCB1, SMARCE1, STK11, SUFU, TMEM127, TP53*, TSC1, TSC2, VHL and XRCC2 (sequencing and deletion/duplication); EGFR, EGLN1, HOXB13, KIT, MITF, PDGFRA, POLD1, and POLE (sequencing only); EPCAM and GREM1 (deletion/duplication only). DNA and RNA analyses performed for * genes.   10/11/2021 Cancer Staging   Staging form: Breast, AJCC 8th Edition - Pathologic stage from 10/11/2021: Stage IA (pT1c, pN0, cM0, G2, ER+, PR+, HER2-, Oncotype DX score: 16) - Signed by Truitt Merle, MD  on  01/08/2022 Stage prefix: Initial diagnosis Multigene prognostic tests performed: Oncotype DX Recurrence score range: Greater than or equal to 11 Histologic grading system: 3 grade system   10/11/2021 Definitive Surgery   FINAL MICROSCOPIC DIAGNOSIS:   A. LYMPH NODE, RIGHT AXILLARY #1, SENTINEL, EXCISION:  - One lymph node negative for metastatic carcinoma (0/1).   B. LYMPH NODE, RIGHT AXILLARY, SENTINEL, EXCISION:  - One lymph node negative for metastatic carcinoma (0/1).   C. LYMPH NODE, RIGHT AXILLARY, SENTINEL, EXCISION:  - One lymph node negative for metastatic carcinoma (0/1).   D. LYMPH NODE, RIGHT AXILLARY #2, SENTINEL, EXCISION:  - One lymph node negative for metastatic carcinoma (0/1).   E. LYMPH NODE, RIGHT AXILLARY, SENTINEL, EXCISION:  - One lymph node negative for metastatic carcinoma (0/1).   F. BREAST, RIGHT, LUMPECTOMY:  - Invasive and in situ ductal carcinoma, 1.4 cm.  - Margins negative for carcinoma.  - Biopsy site and biopsy clip.  - See oncology table.    10/11/2021 Oncotype testing   Oncotype DX was obtained on the final surgical sample and the recurrence score of 16 predicts a risk of recurrence outside the breast over the next 9 years of 4%, if the patient's only systemic therapy is an antiestrogen for 5 years.  It also predicts no significant benefit from chemotherapy.    05/05/2022 Survivorship   SCP delivered by Cira Rue, NP     INTERVAL HISTORY:  Emily Montoya presents to the [facility] today for our initial meeting to review her survivorship care plan detailing her treatment course for breast cancer, as well as monitoring long-term side effects of that treatment, education regarding health maintenance, screening, and overall wellness and health promotion.     Overall, Emily Montoya has had a difficult time but feels okay in general.  Her mood improved holding tamoxifen and on venlafaxine.  She has established care with Dr. Michail Sermon and  plans to see him again soon.  The injection in her right shoulder wore off 3 weeks ago and she has significantly decreased mobility and range of motion.  She has another injection scheduled 7/11.  She had to stop PT due to pain, and is experiencing right breast lymphedema.  She also has neck pain and stiffness and is scheduled for an injection 7/13.  She will speak with the neurosurgeon at that time to discuss surgery.  She had difficulty carrying a pot of hot water and fell recently, on her knees without significant injury.     REVIEW OF SYSTEMS:  Review of Systems - Oncology Breast: Denies any new nodularity, masses, tenderness, nipple changes, or nipple discharge.      ONCOLOGY TREATMENT TEAM:  1. Surgeon:  Dr. Marlou Starks at [facility] 2. Medical Oncologist: Dr. Burr Medico 3. Radiation Oncologist: Dr. Lisbeth Renshaw    PAST MEDICAL/SURGICAL HISTORY:  Past Medical History:  Diagnosis Date   Breast cancer ([place]) 08/20/2021   Sickle cell trait ([place])    Past Surgical History:  Procedure Laterality Date   BREAST LUMPECTOMY WITH RADIOACTIVE SEED AND SENTINEL LYMPH NODE BIOPSY Right 10/11/2021   Procedure: RIGHT BREAST LUMPECTOMY WITH RADIOACTIVE SEED LOCALIZATION AND SENTINEL LYMPH NODE BIOPSY;  Surgeon: [name], MD;  Location: [place];  Service: General;  Laterality: Right;   TUBAL LIGATION       ALLERGIES:  Allergies  Allergen Reactions   Meloxicam Swelling     CURRENT MEDICATIONS:  Outpatient Encounter Medications as of 05/05/2022  Medication Sig  tamoxifen (NOLVADEX) 10 MG tablet TAKE 1 TABLET(10 MG) BY MOUTH DAILY   tiZANidine (ZANAFLEX) 4 MG tablet Take 1 tablet (4 mg total) by mouth every 8 (eight) hours as needed for muscle spasms.   venlafaxine XR (EFFEXOR-XR) 75 MG 24 hr capsule TAKE 1 CAPSULE BY MOUTH ONCE DAILY WITH BREAKFAST   [DISCONTINUED] tamoxifen (NOLVADEX) 20 MG tablet TAKE 1 TABLET(20 MG) BY MOUTH DAILY. STOP TAKING 1 WEEK BEFORE SURGERY    No facility-administered encounter medications on file as of 05/05/2022.     ONCOLOGIC FAMILY HISTORY:  Family History  Problem Relation Age of Onset   HIV/AIDS Mother    Cancer Maternal Grandmother        Throat     GENETIC COUNSELING/TESTING: Yes, negative  SOCIAL HISTORY:  Emily Montoya is single, daughter lives with her now.  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  She smoked cigarettes remotely for a total of 12 years less than 1 pack/day, quit 08/10/2021.  Denies any current obacco, alcohol, or illicit drug use.     PHYSICAL EXAMINATION:  Vital Signs:   Vitals:   05/05/22 1239  BP: 116/71  Pulse: 69  Resp: 15  Temp: 98.5 F (36.9 C)  SpO2: 97%   Filed Weights   05/05/22 1239  Weight: 163 lb 1.6 oz (74 kg)   General: Well-nourished, well-appearing female in no acute distress.   HEENT: Sclerae anicteric.  Lymph: No cervical, supraclavicular, or infraclavicular lymphadenopathy noted on palpation.  Respiratory:  breathing non-labored.  Neuro: No focal deficits. Steady gait.  Psych: Mood and affect normal and appropriate for situation.  MSK: Limited ROM in the right shoulder and C-spine  Skin: Warm and dry. Breast exam: No bilateral nipple discharge or inversion.  S/p right lumpectomy and radiation, incisions completely healed with mild scar tissue.  Diffuse skin hyperpigmentation and lymphedema of the right breast.  No palpable mass or nodularity in either breast or axilla that I could appreciate.  LABORATORY DATA:  None for this visit.  DIAGNOSTIC IMAGING:  None for this visit.      ASSESSMENT AND PLAN:  Ms.. Zuniga is a pleasant 51 y.o. 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  She presents to the Survivorship Clinic for our initial meeting and routine follow-up post-completion  of treatment for breast cancer.    1. Stage I right breast cancer:  Ms. Descoteaux is continuing to recover from definitive treatment for breast cancer. She will follow-up with her medical oncologist, Dr. Burr Medico in 07/2022 with history and physical exam per surveillance protocol.  Tamoxifen was on hold for depression which is now improved and managed well with Effexor.  Continue mental health provider.  I recommend to restart tamoxifen at 50% dose, 10 mg p.o. once daily, she agrees.  Due to the thrombosis risk I also recommend to hold 1 week prior and 3 weeks post neurosurgery in the future, a date is not set yet.  She was instructed to make Dr. Burr Medico or myself aware if she begins to experience any worsening side effects of the medication and I could see her back in clinic to help manage those side effects, as needed. Though the incidence is low, there is an associated risk of endometrial cancer with anti-estrogen therapies like Tamoxifen.  Ms. Janelle was encouraged to contact Dr. Burr Medico or myself with any vaginal bleeding while taking Tamoxifen. Other side  effects of Tamoxifen were again reviewed with her as well. Today, a comprehensive survivorship care plan and treatment summary was reviewed with the patient today detailing her breast cancer diagnosis, treatment course, potential late/long-term effects of treatment, appropriate follow-up care with recommendations for the future, and patient education resources.  A copy of this summary, along with a letter will be sent to the patient's primary care provider via mail/fax/In Basket message after today's visit.    2.  Right shoulder and cervical spine stiffness, decreased ROM: She has had right shoulder stiffness and pain since lumpectomy, she overcompensated on the left side and now has C-spine stiffness.  An x-ray shows degenerative changes, per patient she had an MRI but I do not have that report.  The right shoulder pain caused her to have to stop lymphedema PT for  the right breast.  She has right shoulder injection 7/11 and cervical injection 7/13.  She plans to discuss neurosurgery on 7/13 and will update [name] on the plan.  Hopefully she can resume PT for right breast lymphedema as well.  3. Bone health:  Given Ms. Mirabella's age/perimenopausal status and treatment with tamoxifen, she does not yet need to begin DEXA screening.  We reviewed the bone strengthening quality of tamoxifen.  In the meantime, she was encouraged to increase her consumption of foods rich in calcium, as well as increase her weight-bearing activities.  She was given education on specific activities to promote bone health.  4. Cancer screening:  Due to Ms. Gisler's history and her age, she should receive screening for skin cancers, colon cancer, and gynecologic cancers.  She requested a referral to [facility] which was placed today.  The information and recommendations are listed on the patient's comprehensive care plan/treatment summary and were reviewed in detail with the patient.    5. Health maintenance and wellness promotion: Ms. Blanchet was encouraged to consume 5-7 servings of fruits and vegetables per day. She was also encouraged to engage in moderate to vigorous exercise for 30 minutes per day most days of the week.  She was instructed to limit her alcohol consumption and continue to abstain from tobacco use.     6. Support services/counseling: It is not uncommon for this period of the patient's cancer care trajectory to be one of many emotions and stressors.  We discussed an opportunity for her to participate in the next session of [facility] ("Finding Your New Normal") support group series designed for patients after they have completed treatment.   Ms. Singley was encouraged to take advantage of our many other support services programs, support groups, and/or counseling in coping with her new life as a cancer survivor after completing anti-cancer treatment.  She was offered support today  through active listening and expressive supportive counseling.  She was given information regarding our available services and encouraged to contact me with any questions or for help enrolling in any of our support group/programs.    Dispo:   -Return to cancer center 07/2022 -Mammogram due in 07/2022 -Follow up with surgery as scheduled -Referral to [facility] for for screening colonoscopy -Updated family cancer history to reflect MGM with throat cancer -Restart low-dose tamoxifen 10 mg p.o. once daily -Hold tamoxifen 1 week prior and 3 weeks post Ortho/neurosurgery -She is welcome to return back to the Survivorship Clinic at any time; no additional follow-up needed at this time.  -Consider referral back to survivorship as a long-term survivor for continued surveillance  Orders Placed This Encounter  Procedures  MM DIAG BREAST TOMO BILATERAL    Standing Status:   Future    Standing Expiration Date:   05/06/2023    Order Specific Question:   Reason for Exam (SYMPTOM  OR DIAGNOSIS REQUIRED)    Answer:   Right breast cancer 07/2021 s/p lumpectomy and radiation    Order Specific Question:   Is the patient pregnant?    Answer:   No    Order Specific Question:   Preferred imaging location?    Answer:   [facility]   Ambulatory referral to Gastroenterology    Referral Priority:   Routine    Referral Type:   Consultation    Referral Reason:   Specialty Services Required    Number of Visits Requested:   1     A total of (40) minutes of face-to-face time was spent with this patient with greater than 50% of that time in counseling and care-coordination.   Cira Rue, NP Survivorship Program [facility] [phone]   Note: PRIMARY CARE PROVIDER Wenda Low, [place] [phone]

## 2022-05-06 ENCOUNTER — Ambulatory Visit (INDEPENDENT_AMBULATORY_CARE_PROVIDER_SITE_OTHER): Payer: BC Managed Care – PPO | Admitting: Orthopaedic Surgery

## 2022-05-06 ENCOUNTER — Encounter: Payer: Self-pay | Admitting: Orthopaedic Surgery

## 2022-05-06 ENCOUNTER — Telehealth: Payer: Self-pay | Admitting: Hematology

## 2022-05-06 DIAGNOSIS — G8929 Other chronic pain: Secondary | ICD-10-CM

## 2022-05-06 DIAGNOSIS — M25511 Pain in right shoulder: Secondary | ICD-10-CM

## 2022-05-06 NOTE — Telephone Encounter (Signed)
Left message with rescheduled upcoming appointment per 7/10 los.

## 2022-05-06 NOTE — Progress Notes (Signed)
The patient is a 51 year old female that I have seen before back in February.  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  She has some mild lymphedema.  I did provide a steroid injection in the subacromial outlet back in February.  It has been 5 months and she developed some stiffness and pain in that right shoulder and wanted to see [name] earlier but thought she needed a referral.  On examination of her right shoulder I do feel this more stiff than it was in terms of rotation and pain with abduction than when I saw her back in February.  I would like to send her to my partner Dr. Sammuel Hines for an ultrasound-guided steroid injection in her right shoulder joint because I think this will be more beneficial than a subacromial steroid injection.  She agrees with this treatment plan.  We will make that referral for her as soon as possible.

## 2022-05-07 ENCOUNTER — Ambulatory Visit (INDEPENDENT_AMBULATORY_CARE_PROVIDER_SITE_OTHER): Payer: BC Managed Care – PPO | Admitting: Orthopaedic Surgery

## 2022-05-07 DIAGNOSIS — M25511 Pain in right shoulder: Secondary | ICD-10-CM

## 2022-05-07 DIAGNOSIS — M7501 Adhesive capsulitis of right shoulder: Secondary | ICD-10-CM

## 2022-05-07 MED ORDER — LIDOCAINE HCL 1 % IJ SOLN
4.0000 mL | INTRAMUSCULAR | Status: AC | PRN
  Administered 2022-05-07: 4 mL

## 2022-05-07 MED ORDER — TRIAMCINOLONE ACETONIDE 40 MG/ML IJ SUSP
80.0000 mg | INTRAMUSCULAR | Status: AC | PRN
  Administered 2022-05-07: 80 mg via INTRA_ARTICULAR

## 2022-05-07 NOTE — Progress Notes (Signed)
Chief Complaint: Right shoulder pain     History of Present Illness:    Emily Montoya is a 51 y.o. female right female presents with right shoulder pain that is been ongoing now for several months.  She is here today as a referral from partner Dr. Ninfa Linden.  He has previously performed a subacromial injection although he has subsequently referred her for glenohumeral type injection.  She has very limited range of motion following a lymph node dissection on the right side in December 2022.  She endorses quite limited motion as well as shoulder pain and difficulty lying directly on the side.    Surgical History:   None  PMH/PSH/Family History/Social History/Meds/Allergies:    Past Medical History:  Diagnosis Date   Breast cancer ([place]) 08/20/2021   Sickle cell trait ([place])    Past Surgical History:  Procedure Laterality Date   BREAST LUMPECTOMY WITH RADIOACTIVE SEED AND SENTINEL LYMPH NODE BIOPSY Right 10/11/2021   Procedure: RIGHT BREAST LUMPECTOMY WITH RADIOACTIVE SEED LOCALIZATION AND SENTINEL LYMPH NODE BIOPSY;  Surgeon: [name], MD;  Location: [place];  Service: General;  Laterality: Right;   TUBAL LIGATION     Social History   Socioeconomic History   Marital status: Legally Separated    Spouse name: Not on file   Number of children: 3   Years of education: Not on file   Highest education level: Not on file  Occupational History   Not on file  Tobacco Use   Smoking status: Former    Packs/day: 0.25    Years: 12.00    Total pack years: 3.00    Types: Cigarettes    Quit date: 08/22/2021    Years since quitting: 0.7   Smokeless tobacco: Never  Vaping Use   Vaping Use: Never used  Substance and Sexual Activity   Alcohol use: Yes    Comment: Occasional Beers   Drug use: Never   Sexual activity: Not on file    Comment: s/p tubal ligation  Other Topics Concern   Not on file  Social History Narrative    Not on file   Social Determinants of Health   Financial Resource Strain: Not on file  Food Insecurity: No Food Insecurity (03/13/2022)   Hunger Vital Sign    Worried About Running Out of Food in the Last Year: Never true    Ran Out of Food in the Last Year: Never true  Transportation Needs: No Transportation Needs (03/13/2022)   PRAPARE - Hydrologist (Medical): No    Lack of Transportation (Non-Medical): No  Physical Activity: Unknown (03/13/2022)   Exercise Vital Sign    Days of Exercise per Week: Not on file    Minutes of Exercise per Session: 0 min  Stress: Not on file  Social Connections: Socially Isolated (03/13/2022)   Social Connection and Isolation Panel [NHANES]    Frequency of Communication with Friends and Family: Once a week    Frequency of Social Gatherings with Friends and Family: Once a week    Attends Religious Services: Never    Marine scientist or Organizations: No    Attends Archivist Meetings: Never    Marital Status: Separated   Family History  Problem Relation Age of Onset  HIV/AIDS Mother    Cancer Maternal Grandmother        Throat   Allergies  Allergen Reactions   Meloxicam Swelling   Current Outpatient Medications  Medication Sig Dispense Refill   tamoxifen (NOLVADEX) 10 MG tablet TAKE 1 TABLET(10 MG) BY MOUTH DAILY 30 tablet 3   tiZANidine (ZANAFLEX) 4 MG tablet Take 1 tablet (4 mg total) by mouth every 8 (eight) hours as needed for muscle spasms. 40 tablet 1   venlafaxine XR (EFFEXOR-XR) 75 MG 24 hr capsule TAKE 1 CAPSULE BY MOUTH ONCE DAILY WITH BREAKFAST 30 capsule 0   No current facility-administered medications for this visit.   No results found.  Review of Systems:   A ROS was performed including pertinent positives and negatives as documented in the HPI.  Physical Exam :   Constitutional: NAD and appears stated age Neurological: Alert and oriented Psych: Appropriate affect and  cooperative There were no vitals taken for this visit.   Comprehensive Musculoskeletal Exam:    Musculoskeletal Exam    Inspection Right Left  Skin No atrophy or winging No atrophy or winging  Palpation    Tenderness GH none  Range of Motion    Flexion (passive) 100 170  Flexion (active) 45 with pain 170  Abduction 40 with pain 170  ER at the side 20 70  Can reach behind back to Back pocket T12  Strength     Not tested Full  Special Tests    Pseudoparalytic No No  Neurologic    Fires PIN, radial, median, ulnar, musculocutaneous, axillary, suprascapular, long thoracic, and spinal accessory innervated muscles. No abnormal sensibility  Vascular/Lymphatic    Radial Pulse 2+ 2+  Cervical Exam    Patient has symmetric cervical range of motion with negative Spurling's test.  Special Test:      Imaging:   Xray (3 views right shoulder): Normal   I personally reviewed and interpreted the radiographs.   Assessment:   51 y.o. female right-hand-dominant presents with right shoulder pain consistent with adhesive capsulitis.  We will plan for a right shoulder glenohumeral injection today in order to help with her capsular inflammation.  I have counseled her on gentle wall climbs.  I would like to see her back in 2 weeks to assess if she needs an additional glenohumeral injections that we can hopefully mitigate and resolve her frozen shoulder.  Plan :    -Return to clinic in 2 weeks for assessment to see if she needs an additional glenohumeral injection -Right shoulder glenohumeral injection performed after verbal consent obtained     Procedure Note  Patient: Emily Montoya             Date of Birth: 25-Apr-1971           MRN: [id number]             Visit Date: 05/07/2022  Procedures: Visit Diagnoses: No diagnosis found.  Large Joint Inj: R glenohumeral on 05/07/2022 3:34 PM Indications: pain Details: 22 G 1.5 in needle, ultrasound-guided anterior approach  Arthrogram:  No  Medications: 4 mL lidocaine 1 %; 80 mg triamcinolone acetonide 40 MG/ML Outcome: tolerated well, no immediate complications Procedure, treatment alternatives, risks and benefits explained, specific risks discussed. Consent was given by the patient. Immediately prior to procedure a time out was called to verify the correct patient, procedure, equipment, support staff and site/side marked as required. Patient was prepped and draped in the usual sterile fashion.  I personally saw and evaluated the patient, and participated in the management and treatment plan.  Vanetta Mulders, MD Attending Physician, Orthopedic Surgery  This document was dictated using Dragon voice recognition software. A reasonable attempt at proof reading has been made to minimize errors.

## 2022-05-19 ENCOUNTER — Ambulatory Visit: Payer: BC Managed Care – PPO | Attending: Hematology | Admitting: Physical Therapy

## 2022-05-19 ENCOUNTER — Other Ambulatory Visit: Payer: Self-pay | Admitting: *Deleted

## 2022-05-19 ENCOUNTER — Encounter: Payer: Self-pay | Admitting: Physical Therapy

## 2022-05-19 DIAGNOSIS — I890 Lymphedema, not elsewhere classified: Secondary | ICD-10-CM | POA: Insufficient documentation

## 2022-05-19 DIAGNOSIS — Z170 Estrogen receptor positive status [ER+]: Secondary | ICD-10-CM | POA: Diagnosis not present

## 2022-05-19 DIAGNOSIS — M25621 Stiffness of right elbow, not elsewhere classified: Secondary | ICD-10-CM

## 2022-05-19 DIAGNOSIS — R293 Abnormal posture: Secondary | ICD-10-CM | POA: Diagnosis not present

## 2022-05-19 DIAGNOSIS — C50211 Malignant neoplasm of upper-inner quadrant of right female breast: Secondary | ICD-10-CM

## 2022-05-19 DIAGNOSIS — M25511 Pain in right shoulder: Secondary | ICD-10-CM | POA: Insufficient documentation

## 2022-05-19 DIAGNOSIS — M25612 Stiffness of left shoulder, not elsewhere classified: Secondary | ICD-10-CM | POA: Diagnosis not present

## 2022-05-19 NOTE — Therapy (Signed)
OUTPATIENT PHYSICAL THERAPY ONCOLOGY EVALUATION  Patient Name: Emily Montoya MRN: [id number] DOB:[birth year], 51 y.o., female Today's Date: 05/19/2022   PT End of Session - 05/19/22 1559     Visit Number 1    Number of Visits 9    Date for PT Re-Evaluation 06/16/22    PT Start Time 1503    PT Stop Time 1600    PT Time Calculation (min) 57 min    Activity Tolerance Patient tolerated treatment well    Behavior During Therapy [facility] for tasks assessed/performed             Past Medical History:  Diagnosis Date   Breast cancer ([place]) 08/20/2021   Sickle cell trait ([place])    Past Surgical History:  Procedure Laterality Date   BREAST LUMPECTOMY WITH RADIOACTIVE SEED AND SENTINEL LYMPH NODE BIOPSY Right 10/11/2021   Procedure: RIGHT BREAST LUMPECTOMY WITH RADIOACTIVE SEED LOCALIZATION AND SENTINEL LYMPH NODE BIOPSY;  Surgeon: [name], MD;  Location: [place];  Service: General;  Laterality: Right;   TUBAL LIGATION     Patient Active Problem List   Diagnosis Date Noted   Genetic testing 09/23/2021   Malignant neoplasm of upper-inner quadrant of right breast in female, estrogen receptor positive ([place]) 08/29/2021    PCP: Wenda Low, MD  REFERRING PROVIDER: Truitt Merle, MD  REFERRING DIAG: C50.211,Z17.0 (ICD-10-CM) - Malignant neoplasm of upper-inner quadrant of right breast in female, estrogen receptor positive ([place])   THERAPY DIAG:  Lymphedema, not elsewhere classified  Stiffness of left shoulder, not elsewhere classified  Acute pain of right shoulder  Stiffness of right elbow, not elsewhere classified  Abnormal posture  Malignant neoplasm of upper-inner quadrant of right breast in female, estrogen receptor positive ([place])  ONSET DATE: 10/11/21  Rationale for Evaluation and Treatment Rehabilitation  SUBJECTIVE                                                                                                                                                                                            SUBJECTIVE STATEMENT: I still can't move my arm after surgery. I have had numerous injections but they wear off. I am back at work but I am so slow I can barely get stuff done. The other side is tingly because I have stuff going on in my neck. I have to have surgery because the injections did not work. My R breast is swelling and that has been like that for a month.   PERTINENT HISTORY:  R breast cancer ER+PR+, R breast lumpectomy and SLNB (0/7) on 10/11/21     PAIN:  Are you  having pain? No b/c she reports she took pain medication  PRECAUTIONS: Other: R frozen shoulder, unable to fully extend R elbow, Lymphedema R breast  WEIGHT BEARING RESTRICTIONS No  FALLS:  Has patient fallen in last 6 months? Yes. 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  LEISURE: 2x/wk does ROM exercise, sits at desk and does bike  HAND DOMINANCE : right   PRIOR LEVEL OF FUNCTION: Independent with basic ADLs and Needs assistance with homemaking  PATIENT GOALS to get better, improve ROM in R arm and decrease R breast swelling   OBJECTIVE  COGNITION:  Overall cognitive status: Within functional limits for tasks assessed   PALPATION: Increased fibrosis in medial inferior R breast and increased scar tissue in area of R lumpectomy scar  OBSERVATIONS / OTHER ASSESSMENTS: R breast more swollen with increased pore size noted    POSTURE: forward head, rounded shoulders  UPPER EXTREMITY AROM/PROM:  A/PROM RIGHT   eval   Shoulder extension   Shoulder flexion 96  Shoulder abduction 56  Shoulder internal rotation   Shoulder external rotation   Elbow extension 21 from 0       (Blank rows = not tested)  A/PROM LEFT   eval   Shoulder extension Not tested due to hx of cervical radiculopathy that requires surgery  Shoulder flexion   Shoulder abduction   Shoulder internal rotation   Shoulder external rotation     (Blank rows = not tested)   LYMPHEDEMA ASSESSMENTS:   SURGERY TYPE/DATE: R breast lumpectomy and SLNB 10/11/21  NUMBER OF LYMPH NODES REMOVED: 0/7  CHEMOTHERAPY: did not require  RADIATION:completed in 2023  HORMONE TREATMENT: currently on tamoxifen  INFECTIONS: none  LYMPHEDEMA ASSESSMENTS:   LANDMARK RIGHT  eval  10 cm proximal to olecranon process 30  Olecranon process 26.5  10 cm proximal to ulnar styloid process 20.7  Just proximal to ulnar styloid process 15.7  Across hand at thumb web space 19.5  At base of 2nd digit 5.8  (Blank rows = not tested)  LANDMARK LEFT  eval  10 cm proximal to olecranon process 29  Olecranon process 26  10 cm proximal to ulnar styloid process 20.2  Just proximal to ulnar styloid process 15.5  Across hand at thumb web space 19.1  At base of 2nd digit 6.5  (Blank rows = not tested)    QUICK DASH SURVEY:   [name] - 05/19/22 0001     Open a tight or new jar Moderate difficulty    Do heavy household chores (wash walls, wash floors) Moderate difficulty    Carry a shopping bag or briefcase Moderate difficulty    Wash your back Severe difficulty    Use a knife to cut food Moderate difficulty    Recreational activities in which you take some force or impact through your arm, shoulder, or hand (golf, hammering, tennis) Moderate difficulty    During the past week, to what extent has your arm, shoulder or hand problem interfered with your normal social activities with family, friends, neighbors, or groups? Modererately    During the past week, to what extent has your arm, shoulder or hand problem limited your work or other regular daily activities Quite a bit    Arm, shoulder, or hand pain.  Moderate    Tingling (pins and needles) in your arm,  shoulder, or hand Moderate    Difficulty Sleeping Moderate difficulty    DASH Score 54.55 %               TODAY'S TREATMENT  05/19/22:  MLD in supine as follows: short neck, 5 diaphragmatic breaths, L axillary nodes and establishment of interaxillary pathway, R inguinal nodes and establishment of axilloinguinal pathway, R breast moving fluid towards pathways then retracing all steps while educating pt in basic principles of MLD and anatomy and physiology of the lymphatic system Created foam chip pack for pt to wear in her bra on the lateral side of her breast Scar mobilization to R lumpectomy scar  PATIENT EDUCATION:  Education details: lymphedema, anatomy and physiology of the lymphatic system, need for compression Person educated: Patient Education method: Explanation Education comprehension: verbalized understanding   HOME EXERCISE PROGRAM: Scar mobilization, wear compression bra with chip pack daily  ASSESSMENT:  CLINICAL IMPRESSION: Patient is a 51 y.o. female who was seen today for physical therapy evaluation and treatment for right breast cancer and decreased R shoulder and R elbow ROM. Pt began developing R breast lymphedema about a month ago. She underwent a R lumpectomy and SLNB (0/7) in Dec 2022 and since that time she has had limited shoulder and elbow ROM. She has been diagnosed with frozen shoulder and has had only minimal improvement with injections and results did not last. Pt would benefit from skilled PT services to improve R shoulder ROM, decrease R shoulder pain, improve R elbow extension and decrease R breast lymphedema. Will not be working on the L shoulder ROM as pt has cervical radiculopathy and is requiring surgery on that side.     OBJECTIVE IMPAIRMENTS decreased knowledge of condition, decreased knowledge of use of DME, decreased ROM, decreased strength, increased edema, increased fascial restrictions, increased muscle spasms, impaired UE functional use,  postural dysfunction, and pain.   ACTIVITY LIMITATIONS carrying, lifting, dressing, reach over head, and hygiene/grooming  PARTICIPATION LIMITATIONS: meal prep, cleaning, laundry, driving, shopping, occupation, and yard work  PERSONAL FACTORS Time since onset of injury/illness/exacerbation and 1 comorbidity: L UE radiculopathy  are also affecting patient's functional outcome.   REHAB POTENTIAL: Good  CLINICAL DECISION MAKING: Evolving/moderate complexity  EVALUATION COMPLEXITY: Moderate  GOALS: Goals reviewed with patient? Yes  SHORT TERM GOALS=LONG TERM GOALS  Target date: 06/16/2022    Pt will demonstrate 150 degrees of L shoulder flexion to allow her to reach overhead.  Baseline: 96 Goal status: INITIAL  2.  Pt will demonstrate 125 degrees of L shoulder abduction to allow her to reach out to the side.  Baseline: 56 Goal status: INITIAL  3.  Pt will be independent in self MLD for long term management of lymphedema.  Baseline:  Goal status: INITIAL  4.  Pt will report a 50% improvement in discomfort and swelling in R breast to allow improved comfort. Baseline:  Goal status: INITIAL  5.  Pt will obtain appropriate compression garment for long term management of R breast lymphedema.  Baseline:  Goal status: INITIAL  6.  Pt will be independent in a home exercise program for long term stretching and strengthening.  Baseline:  Goal status: INITIAL   PLAN: PT FREQUENCY: 2x/week  PT DURATION: 4 weeks  PLANNED INTERVENTIONS: Therapeutic exercises, Therapeutic activity, Patient/Family education, Self Care, Joint mobilization, Orthotic/Fit training, Manual lymph drainage, Compression bandaging, scar mobilization, Vasopneumatic device, Ionotophoresis '4mg'$ /ml Dexamethasone, and Manual therapy  PLAN FOR NEXT SESSION: PROM to R shoulder and elbow, MLD to R breast and instruct pt, how is compression bra?   [facility], PT 05/19/2022, 4:10 PM

## 2022-05-20 ENCOUNTER — Encounter: Payer: Self-pay | Admitting: Hematology

## 2022-05-20 ENCOUNTER — Ambulatory Visit (INDEPENDENT_AMBULATORY_CARE_PROVIDER_SITE_OTHER): Payer: BC Managed Care – PPO | Admitting: Psychologist

## 2022-05-20 DIAGNOSIS — F4521 Hypochondriasis: Secondary | ICD-10-CM

## 2022-05-20 NOTE — Progress Notes (Signed)
[place] Counselor/Therapist Progress Note  Patient ID: Emily Montoya, MRN: [id number],    Date: 05/20/2022  Time Spent: 08:02 am to 08:44 am; total time: 42 minutes   This session was held via video webex teletherapy due to the coronavirus risk at this time. The patient consented to video teletherapy and was located at her home during this session. She is aware it is the responsibility of the patient to secure confidentiality on her end of the session. The provider was in a private home office for the duration of this session. Limits of confidentiality were discussed with the patient.   Treatment Type: Individual Therapy  Reported Symptoms: Anxiety related to finding a different lump in her breast  Mental Status Exam: Appearance:  Well Groomed     Behavior: Appropriate  Motor: Normal  Speech/Language:  Clear and Coherent  Affect: Appropriate  Mood: normal  Thought process: normal  Thought content:   WNL  Sensory/Perceptual disturbances:   WNL  Orientation: oriented to person, place, and time/date  Attention: Good  Concentration: Good  Memory: WNL  Fund of knowledge:  Good  Insight:   Good  Judgment:  Good  Impulse Control: Good   Risk Assessment: Danger to Self:  No Self-injurious Behavior: No Danger to Others: No Duty to Warn:no Physical Aggression / Violence:No  Access to Firearms a concern: No  Gang Involvement:No   Subjective: Beginning the session patient described herself as okay. After reviewing the treatment plan, patient shared some discouragement related to what is going on medically with her at this time. Specifically, she endorsed being discouraged about experiencing lymphedema. She also disclosed that she found a new lump in her breast that has her worried. She talked about a couple of themes associated with cancer treatment. She then voiced wanting coping strategies. After discussing, practicing, and processing the strategies she  described herself as better. She was agreeable to homework and following up. She denied suicidal and homicidal ideation.    Interventions:  Worked on developing a therapeutic relationship with the patient using active listening and reflective statements. Provided emotional support using empathy and validation. Reviewed the treatment plan with the patient. Reviewed events since the intake. Validated and normalized expressed thoughts and emotions. Identified several themes associated with cancer treatment. Used reflective statements. Processed thoughts and emotions. Provided psychoeducation about mindfulness, mindful moments, calm, and guided imagery. Practiced and processed mindfulness and guided imagery. Praised patient for experiencing less distress. Provided psychoeducation about the different between mindfulness and guided imagery. Assisted in problem solving. Assigned homework. Provided empathic statements. Assessed for suicidal and homicidal ideation.   Homework: Implement mindfulness and guided imagery. Go to doctor's appointments  Next Session: Review homework, review doctor's appointment, possibly defusion, and emotional support.   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  Plan:   Goals Work through the grieving process and face reality of own death Accept emotional support from others around them Live life to the fullest, event though time may be limited Become as knowledgeable about the medical condition  Reduce fear, anxiety about the health condition  Accept the illness Accept the role of psychological and behavioral factors  Stabilize anxiety level wile increasing ability to function Learn and implement coping skills that result in a reduction of anxiety  Alleviate depressive symptoms Recognize, accept, and cope with depressive feelings Develop healthy thinking patterns Develop healthy interpersonal relationships  Objectives target date for all objectives is  04/26/2023 Identify feelings associated with the illness Family members share with each  other feelings Identify the losses or limitations that have been experienced Verbalize acceptance of the reality of the medical condition Commit to learning and implement a proactive approach to managing personal stresses Verbalize an understanding of the medical condition Work with therapist to develop a plan for coping with stress Learn and implement skills for managing stress Engage in social, productive activities that are possible Engage in faith based activities implement positive imagery Identify coping skills and sources of emotional support Patient's partner and family members verbalize their fears regarding severity of health condition Identify sources of emotional distress  Learning and implement calming skills to reduce overall anxiety Learn and implement problem solving strategies Identify and engage in pleasant activities Learning and implement personal and interpersonal skills to reduce anxiety and improve interpersonal relationships Learn to accept limitations in life and commit to tolerating, rather than avoiding, unpleasant emotions while accomplishing meaningful goals Identify major life conflicts from the past and present that form the basis for present anxiety Learn and implement behavioral strategies Verbalize an understanding and resolution of current interpersonal problems Learn and implement problem solving and decision making skills Learn and implement conflict resolution skills to resolve interpersonal problems Verbalize an understanding of healthy and unhealthy emotions verbalize insight into how past relationships may be influence current experiences with depression Use mindfulness and acceptance strategies and increase value based behavior  Increase hopeful statements about the future.   Interventions Teach about stress and ways to handle stress Assist the patient in  developing a coping action plan for stressors Conduct skills based training for coping strategies Train problem focused skills Sort out what activities the individual can do Encourage patient to rely upon his/her spiritual faith Teach the patient to use guided imagery Probe and evaluate family's ability to provide emotional support Allow family to share their fears Assist the patient in identifying, sorting through, and verbalizing the various feelings generated by his/her medical condition Meet with family members  Ask patient list out limitations  Use stress inoculation training  Use Acceptance and Commitment Therapy to help client accept uncomfortable realities in order to accomplish value-consistent goals Reinforce the client's insight into the role of his/her past emotional pain and present anxiety  Discuss examples demonstrating that unrealistic worry overestimates the probability of threats and underestimate patient's ability  Assist the patient in analyzing his or her worries Help patient understand that avoidance is reinforcing  Behavioral activation help the client explore the relationship, nature of the dispute,  Help the client develop new interpersonal skills and relationships Conduct Problem so living therapy Teach conflict resolution skills Use a process-experiential approach Conduct TLDP Conduct ACT  The patient and clinician reviewed the treatment plan on 05/20/2022. The patient approved of the treatment plan.   Conception Chancy, PsyD

## 2022-05-20 NOTE — Progress Notes (Signed)
                Melora Menon, PsyD 

## 2022-05-21 ENCOUNTER — Ambulatory Visit (HOSPITAL_BASED_OUTPATIENT_CLINIC_OR_DEPARTMENT_OTHER): Payer: BC Managed Care – PPO | Admitting: Orthopaedic Surgery

## 2022-05-21 ENCOUNTER — Telehealth: Payer: Self-pay | Admitting: Hematology

## 2022-05-21 DIAGNOSIS — M7501 Adhesive capsulitis of right shoulder: Secondary | ICD-10-CM | POA: Diagnosis not present

## 2022-05-21 DIAGNOSIS — M25511 Pain in right shoulder: Secondary | ICD-10-CM | POA: Diagnosis not present

## 2022-05-21 MED ORDER — LIDOCAINE HCL 1 % IJ SOLN
4.0000 mL | INTRAMUSCULAR | Status: AC | PRN
  Administered 2022-05-21: 4 mL

## 2022-05-21 MED ORDER — TRIAMCINOLONE ACETONIDE 40 MG/ML IJ SUSP
80.0000 mg | INTRAMUSCULAR | Status: AC | PRN
  Administered 2022-05-21: 80 mg via INTRA_ARTICULAR

## 2022-05-21 NOTE — Progress Notes (Signed)
Chief Complaint: Right shoulder pain     History of Present Illness:   05/21/2022: Unfortunately she did not get much relief from her previous right glenohumeral injection.  She is here today for further assessment.  She has been working with physical therapy  Emily Montoya is a 51 y.o. female right female presents with right shoulder pain that is been ongoing now for several months.  She is here today as a referral from partner Dr. Ninfa Linden.  He has previously performed a subacromial injection although he has subsequently referred her for glenohumeral type injection.  She has very limited range of motion following a lymph node dissection on the right side in December 2022.  She endorses quite limited motion as well as shoulder pain and difficulty lying directly on the side.    Surgical History:   None  PMH/PSH/Family History/Social History/Meds/Allergies:    Past Medical History:  Diagnosis Date   Breast cancer ([place]) 08/20/2021   Sickle cell trait ([place])    Past Surgical History:  Procedure Laterality Date   BREAST LUMPECTOMY WITH RADIOACTIVE SEED AND SENTINEL LYMPH NODE BIOPSY Right 10/11/2021   Procedure: RIGHT BREAST LUMPECTOMY WITH RADIOACTIVE SEED LOCALIZATION AND SENTINEL LYMPH NODE BIOPSY;  Surgeon: [name], MD;  Location: [place];  Service: General;  Laterality: Right;   TUBAL LIGATION     Social History   Socioeconomic History   Marital status: Legally Separated    Spouse name: Not on file   Number of children: 3   Years of education: Not on file   Highest education level: Not on file  Occupational History   Not on file  Tobacco Use   Smoking status: Former    Packs/day: 0.25    Years: 12.00    Total pack years: 3.00    Types: Cigarettes    Quit date: 08/22/2021    Years since quitting: 0.7   Smokeless tobacco: Never  Vaping Use   Vaping Use: Never used  Substance and Sexual Activity   Alcohol  use: Yes    Comment: Occasional Beers   Drug use: Never   Sexual activity: Not on file    Comment: s/p tubal ligation  Other Topics Concern   Not on file  Social History Narrative   Not on file   Social Determinants of Health   Financial Resource Strain: Not on file  Food Insecurity: No Food Insecurity (03/13/2022)   Hunger Vital Sign    Worried About Running Out of Food in the Last Year: Never true    Ran Out of Food in the Last Year: Never true  Transportation Needs: No Transportation Needs (03/13/2022)   PRAPARE - Hydrologist (Medical): No    Lack of Transportation (Non-Medical): No  Physical Activity: Unknown (03/13/2022)   Exercise Vital Sign    Days of Exercise per Week: Not on file    Minutes of Exercise per Session: 0 min  Stress: Not on file  Social Connections: Socially Isolated (03/13/2022)   Social Connection and Isolation Panel [NHANES]    Frequency of Communication with Friends and Family: Once a week    Frequency of Social Gatherings with Friends and Family: Once a week    Attends Religious Services: Never    Retail buyer of [employer]  or Organizations: No    Attends Archivist Meetings: Never    Marital Status: Separated   Family History  Problem Relation Age of Onset   HIV/AIDS Mother    Cancer Maternal Grandmother        Throat   Allergies  Allergen Reactions   Meloxicam Swelling   Current Outpatient Medications  Medication Sig Dispense Refill   oxyCODONE-acetaminophen (PERCOCET/ROXICET) 5-325 MG tablet Take by mouth every 6 (six) hours as needed for severe pain.     tamoxifen (NOLVADEX) 10 MG tablet TAKE 1 TABLET(10 MG) BY MOUTH DAILY 30 tablet 3   tiZANidine (ZANAFLEX) 4 MG tablet Take 1 tablet (4 mg total) by mouth every 8 (eight) hours as needed for muscle spasms. 40 tablet 1   venlafaxine XR (EFFEXOR-XR) 75 MG 24 hr capsule TAKE 1 CAPSULE BY MOUTH ONCE DAILY WITH BREAKFAST 30 capsule 0   No current  facility-administered medications for this visit.   No results found.  Review of Systems:   A ROS was performed including pertinent positives and negatives as documented in the HPI.  Physical Exam :   Constitutional: NAD and appears stated age Neurological: Alert and oriented Psych: Appropriate affect and cooperative There were no vitals taken for this visit.   Comprehensive Musculoskeletal Exam:    Musculoskeletal Exam    Inspection Right Left  Skin No atrophy or winging No atrophy or winging  Palpation    Tenderness GH none  Range of Motion    Flexion (passive) 100 170  Flexion (active) 45 with pain 170  Abduction 40 with pain 170  ER at the side 20 70  Can reach behind back to Back pocket T12  Strength     Not tested Full  Special Tests    Pseudoparalytic No No  Neurologic    Fires PIN, radial, median, ulnar, musculocutaneous, axillary, suprascapular, long thoracic, and spinal accessory innervated muscles. No abnormal sensibility  Vascular/Lymphatic    Radial Pulse 2+ 2+  Cervical Exam    Patient has symmetric cervical range of motion with negative Spurling's test.  Special Test:      Imaging:   Xray (3 views right shoulder): Normal   I personally reviewed and interpreted the radiographs.   Assessment:   51 y.o. female right-hand-dominant presents with right shoulder pain consistent with adhesive capsulitis.  At this time I would like to perform an additional glenohumeral injection in order to hopefully get her relief and at this time she may begin working with physical therapy in order to work on passive and active range of motion of the shoulder.  I will plan to see her back in 2 weeks for reassessment of her injection Plan :    -Return to clinic in 2 weeks for assessment to see if she needs an additional glenohumeral injection -Right shoulder glenohumeral injection performed after verbal consent obtained     Procedure Note  Patient: Emily Montoya             Date of Birth: 1971/04/04           MRN: [id number]             Visit Date: 05/21/2022  Procedures: Visit Diagnoses:  1. Adhesive capsulitis of right shoulder     Large Joint Inj: R glenohumeral on 05/21/2022 4:43 PM Indications: pain Details: 22 G 1.5 in needle, ultrasound-guided anterior approach  Arthrogram: No  Medications: 4 mL lidocaine 1 %; 80 mg triamcinolone acetonide 40 MG/ML  Outcome: tolerated well, no immediate complications Procedure, treatment alternatives, risks and benefits explained, specific risks discussed. Consent was given by the patient. Immediately prior to procedure a time out was called to verify the correct patient, procedure, equipment, support staff and site/side marked as required. Patient was prepped and draped in the usual sterile fashion.          I personally saw and evaluated the patient, and participated in the management and treatment plan.  Vanetta Mulders, MD Attending Physician, Orthopedic Surgery  This document was dictated using Dragon voice recognition software. A reasonable attempt at proof reading has been made to minimize errors.

## 2022-05-21 NOTE — Telephone Encounter (Signed)
.  Called patient to schedule appointment per 7/26 inbasket, patient is aware of date and time.   

## 2022-05-22 DIAGNOSIS — Z6827 Body mass index (BMI) 27.0-27.9, adult: Secondary | ICD-10-CM | POA: Diagnosis not present

## 2022-05-22 DIAGNOSIS — M5412 Radiculopathy, cervical region: Secondary | ICD-10-CM | POA: Diagnosis not present

## 2022-05-27 ENCOUNTER — Other Ambulatory Visit: Payer: Self-pay

## 2022-05-27 ENCOUNTER — Inpatient Hospital Stay: Payer: BC Managed Care – PPO | Attending: Nurse Practitioner | Admitting: Nurse Practitioner

## 2022-05-27 ENCOUNTER — Ambulatory Visit: Payer: BC Managed Care – PPO | Attending: Hematology

## 2022-05-27 ENCOUNTER — Encounter: Payer: Self-pay | Admitting: Nurse Practitioner

## 2022-05-27 VITALS — BP 117/74 | HR 74 | Temp 98.4°F | Resp 15 | Wt 162.5 lb

## 2022-05-27 DIAGNOSIS — R293 Abnormal posture: Secondary | ICD-10-CM | POA: Diagnosis not present

## 2022-05-27 DIAGNOSIS — M25511 Pain in right shoulder: Secondary | ICD-10-CM | POA: Insufficient documentation

## 2022-05-27 DIAGNOSIS — M25611 Stiffness of right shoulder, not elsewhere classified: Secondary | ICD-10-CM | POA: Insufficient documentation

## 2022-05-27 DIAGNOSIS — Z170 Estrogen receptor positive status [ER+]: Secondary | ICD-10-CM | POA: Insufficient documentation

## 2022-05-27 DIAGNOSIS — N644 Mastodynia: Secondary | ICD-10-CM | POA: Diagnosis not present

## 2022-05-27 DIAGNOSIS — Z79810 Long term (current) use of selective estrogen receptor modulators (SERMs): Secondary | ICD-10-CM | POA: Insufficient documentation

## 2022-05-27 DIAGNOSIS — M25612 Stiffness of left shoulder, not elsewhere classified: Secondary | ICD-10-CM | POA: Diagnosis not present

## 2022-05-27 DIAGNOSIS — C50211 Malignant neoplasm of upper-inner quadrant of right female breast: Secondary | ICD-10-CM | POA: Insufficient documentation

## 2022-05-27 DIAGNOSIS — J189 Pneumonia, unspecified organism: Secondary | ICD-10-CM

## 2022-05-27 DIAGNOSIS — Z483 Aftercare following surgery for neoplasm: Secondary | ICD-10-CM | POA: Diagnosis not present

## 2022-05-27 DIAGNOSIS — I890 Lymphedema, not elsewhere classified: Secondary | ICD-10-CM | POA: Insufficient documentation

## 2022-05-27 DIAGNOSIS — M25621 Stiffness of right elbow, not elsewhere classified: Secondary | ICD-10-CM | POA: Insufficient documentation

## 2022-05-27 HISTORY — DX: Pneumonia, unspecified organism: J18.9

## 2022-05-27 HISTORY — PX: NECK SURGERY: SHX720

## 2022-05-27 NOTE — Progress Notes (Signed)
This nurse made appointments for patients ultrasound and mammogram of right breast at the [facility].  Printed scheduled appointments for patient.  Advised per provider that patient may hold Lymphedema treatments if she cannot tolerate them at this time.  Patient acknowledged understanding.  No further questions or concerns at this time.

## 2022-05-27 NOTE — Therapy (Signed)
OUTPATIENT PHYSICAL THERAPY TREATMENT NOTE   Patient Name: Emily Montoya MRN: [id number] DOB:12/27/1970, 51 y.o., female Today's Date: 05/27/2022  PCP: Emily Low, MD REFERRING PROVIDER: Truitt Merle, MD  END OF SESSION:   PT End of Session - 05/27/22 0915     Visit Number 2    Number of Visits 9    Date for PT Re-Evaluation 06/16/22    PT Start Time 0911   pt arrived late   PT Stop Time 0958    PT Time Calculation (min) 47 min    Activity Tolerance Patient tolerated treatment well    Behavior During Therapy [facility] for tasks assessed/performed             Past Medical History:  Diagnosis Date   Breast cancer ([place]) 08/20/2021   Sickle cell trait ([place])    Past Surgical History:  Procedure Laterality Date   BREAST LUMPECTOMY WITH RADIOACTIVE SEED AND SENTINEL LYMPH NODE BIOPSY Right 10/11/2021   Procedure: RIGHT BREAST LUMPECTOMY WITH RADIOACTIVE SEED LOCALIZATION AND SENTINEL LYMPH NODE BIOPSY;  Surgeon: Emily Kussmaul, MD;  Location: [place];  Service: General;  Laterality: Right;   TUBAL LIGATION     Patient Active Problem List   Diagnosis Date Noted   Genetic testing 09/23/2021   Malignant neoplasm of upper-inner quadrant of right breast in female, estrogen receptor positive ([place]) 08/29/2021    REFERRING DIAG: C50.211,Z17.0 (ICD-10-CM) - Malignant neoplasm of upper-inner quadrant of right breast in female, estrogen receptor positive ([place])  THERAPY DIAG: Lymphedema, not elsewhere classified  Stiffness of left shoulder, not elsewhere classified  Acute pain of right shoulder  Stiffness of right elbow, not elsewhere classified  Abnormal posture  Malignant neoplasm of upper-inner quadrant of right breast in female, estrogen receptor positive ([place])  Rationale for Evaluation and Treatment Rehabilitation  PERTINENT HISTORY: R breast cancer ER+PR+, R breast lumpectomy and SLNB (0/7) on 10/11/21   PRECAUTIONS:  Other: R frozen shoulder, unable  to fully extend R elbow, Lymphedema R breast  SUBJECTIVE: I've been wearing the pillow (chip pack) that Emily Montoya made for me last time in my compression bra and that has really helped my breast.   PAIN:  Are you having pain? No  Rt upper quadrant just feels very tight   OBJECTIVE: (objective measures completed at initial evaluation unless otherwise dated)   PALPATION: Increased fibrosis in medial inferior R breast and increased scar tissue in area of R lumpectomy scar   OBSERVATIONS / OTHER ASSESSMENTS: R breast more swollen with increased pore size noted      POSTURE: forward head, rounded shoulders   UPPER EXTREMITY AROM/PROM:   A/PROM RIGHT   eval    Shoulder extension    Shoulder flexion 96  Shoulder abduction 56  Shoulder internal rotation    Shoulder external rotation    Elbow extension 21 from 0                               (Blank rows = not tested)   A/PROM LEFT   eval  Shoulder extension Not tested due to hx of cervical radiculopathy that requires surgery  Shoulder flexion   Shoulder abduction   Shoulder internal rotation   Shoulder external rotation                           (Blank rows = not tested)  LYMPHEDEMA ASSESSMENTS:    SURGERY TYPE/DATE: R breast lumpectomy and SLNB 10/11/21   NUMBER OF LYMPH NODES REMOVED: 0/7   CHEMOTHERAPY: did not require   RADIATION:completed in 2023   HORMONE TREATMENT: currently on tamoxifen   INFECTIONS: none   LYMPHEDEMA ASSESSMENTS:    LANDMARK RIGHT  eval  10 cm proximal to olecranon process 30  Olecranon process 26.5  10 cm proximal to ulnar styloid process 20.7  Just proximal to ulnar styloid process 15.7  Across hand at thumb web space 19.5  At base of 2nd digit 5.8  (Blank rows = not tested)   LANDMARK LEFT  eval  10 cm proximal to olecranon process 29  Olecranon process 26  10 cm proximal to ulnar styloid process 20.2  Just proximal to ulnar styloid process 15.5  Across hand at thumb web  space 19.1  At base of 2nd digit 6.5  (Blank rows = not tested)       QUICK DASH SURVEY:    Emily Montoya - 05/19/22 0001       Open a tight or new jar Moderate difficulty     Do heavy household chores (wash walls, wash floors) Moderate difficulty     Carry a shopping bag or briefcase Moderate difficulty     Wash your back Severe difficulty     Use a knife to cut food Moderate difficulty     Recreational activities in which you take some force or impact through your arm, shoulder, or hand (golf, hammering, tennis) Moderate difficulty     During the past week, to what extent has your arm, shoulder or hand problem interfered with your normal social activities with family, friends, neighbors, or groups? Modererately     During the past week, to what extent has your arm, shoulder or hand problem limited your work or other regular daily activities Quite a bit     Arm, shoulder, or hand pain. Moderate     Tingling (pins and needles) in your arm, shoulder, or hand Moderate     Difficulty Sleeping Moderate difficulty     DASH Score 54.55 %                       TODAY'S TREATMENT  05/27/22: Manual Therapy MLD in supine as follows: short neck, 5 diaphragmatic breaths, L axillary nodes and establishment of anterior inter-axillary pathway, R inguinal nodes and establishment of Rt axillo-inguinal pathway, R breast moving fluid towards pathways then retracing all steps while reviewing basic principles of MLD and anatomy and physiology of the lymphatic system P/ROM of Rt elbow and shoulder as pt could tolerate, pt struggles with relaxing due to max muscle guarding; contract/relax to promote elbow extension and shoulder flexion, 3x each to her tolerance STM gently at Rt axilla where pt mod tender to touch, to bicep during elbow P/ROM; then in Lt S/L with cocoa butter to Rt medial scapular border and upper trap   05/19/22:  MLD in supine as follows: short neck, 5 diaphragmatic breaths, L axillary  nodes and establishment of interaxillary pathway, R inguinal nodes and establishment of axilloinguinal pathway, R breast moving fluid towards pathways then retracing all steps while educating pt in basic principles of MLD and anatomy and physiology of the lymphatic system Created foam chip pack for pt to wear in her bra on the lateral side of her breast Scar mobilization to R lumpectomy scar   PATIENT EDUCATION:  Education details: lymphedema, anatomy and physiology  of the lymphatic system, need for compression Person educated: Patient Education method: Explanation Education comprehension: verbalized understanding     HOME EXERCISE PROGRAM: Scar mobilization, wear compression bra with chip pack daily   ASSESSMENT:   CLINICAL IMPRESSION:    Pt presents to physical therapy today reporting her breast has been feeling some better since the chip pack issued to her at last session in her compression bra. She struggled to relax during session today due to prolong muscle guarding of Rt upper quadrant. However, by end of session her Rt elbow extension had markedly improved and her shoulder was not quite as tense. Also improved scapular mobility noted after scap mobs. By end of session pt reported her shoulder feeling much looser. Encouraged her to work on relaxing her Rt upper quadrant by trying to "drop" her Rt shoulder by pulling scapula downwards and working on relaxing her Rt arm/elbow when arm is at her side. Also encouraged her to try table slides keeping scapula down and then performing elbow extension or with arm on arm of couch to gently try pushing arm into elbow ext with no scapular compensations. Pt able to verbalize good understanding of all and seemed to feel some better by end of session today.      OBJECTIVE IMPAIRMENTS decreased knowledge of condition, decreased knowledge of use of DME, decreased ROM, decreased strength, increased edema, increased fascial restrictions, increased muscle  spasms, impaired UE functional use, postural dysfunction, and pain.    ACTIVITY LIMITATIONS carrying, lifting, dressing, reach over head, and hygiene/grooming   PARTICIPATION LIMITATIONS: meal prep, cleaning, laundry, driving, shopping, occupation, and yard work   PERSONAL FACTORS Time since onset of injury/illness/exacerbation and 1 comorbidity: L UE radiculopathy  are also affecting patient's functional outcome.    REHAB POTENTIAL: Good   CLINICAL DECISION MAKING: Evolving/moderate complexity   EVALUATION COMPLEXITY: Moderate   GOALS: Goals reviewed with patient? Yes   SHORT TERM GOALS=LONG TERM GOALS  Target date: 06/16/2022     Pt will demonstrate 150 degrees of L shoulder flexion to allow her to reach overhead.  Baseline: 96 Goal status: INITIAL   2.  Pt will demonstrate 125 degrees of L shoulder abduction to allow her to reach out to the side.  Baseline: 56 Goal status: INITIAL   3.  Pt will be independent in self MLD for long term management of lymphedema.  Baseline:  Goal status: INITIAL   4.  Pt will report a 50% improvement in discomfort and swelling in R breast to allow improved comfort. Baseline:  Goal status: INITIAL   5.  Pt will obtain appropriate compression garment for long term management of R breast lymphedema.  Baseline:  Goal status: INITIAL   6.  Pt will be independent in a home exercise program for long term stretching and strengthening.  Baseline:  Goal status: INITIAL     PLAN: PT FREQUENCY: 2x/week   PT DURATION: 4 weeks   PLANNED INTERVENTIONS: Therapeutic exercises, Therapeutic activity, Patient/Family education, Self Care, Joint mobilization, Orthotic/Fit training, Manual lymph drainage, Compression bandaging, scar mobilization, Vasopneumatic device, Ionotophoresis '4mg'$ /ml Dexamethasone, and Manual therapy   PLAN FOR NEXT SESSION: PROM to R shoulder and elbow, MLD to R breast and instruct pt, how is gentle A/ROM of Rt UE going?      Otelia Limes, PTA 05/27/2022, 12:12 PM

## 2022-05-27 NOTE — Progress Notes (Signed)
[place]   [phone] [phone]) [phone]   Clinic Follow up Note   Patient Care Team: Wenda Low, MD as PCP - General (Internal Medicine) [name], MD as Consulting Physician (General Surgery) Truitt Merle, MD as Consulting Physician (Hematology) Rockwell Germany, RN as Oncology Nurse Navigator Mauro Kaufmann, RN as Oncology Nurse Navigator Alla Feeling, NP as Nurse Practitioner (Nurse Practitioner) 05/27/2022  CHIEF COMPLAINT: New palpable right breast lump   SUMMARY OF ONCOLOGIC HISTORY: Oncology History Overview Note   Cancer Staging  Malignant neoplasm of upper-inner quadrant of right breast in female, estrogen receptor positive ([place]) Staging form: Breast, AJCC 8th Edition - Clinical stage from 08/20/2021: Stage IA (cT1c, cN0, cM0, G2, ER+, PR+, HER2-) - Signed by Truitt Merle, MD on 09/02/2021 Method of lymph node assessment: Clinical Histologic grading system: 3 grade system - Pathologic stage from 10/11/2021: Stage IA (pT1c, pN0, cM0, G2, ER+, PR+, HER2-, Oncotype DX score: 16) - Signed by Truitt Merle, MD on 01/08/2022 Stage prefix: Initial diagnosis Multigene prognostic tests performed: Oncotype DX Recurrence score range: Greater than or equal to 11 Histologic grading system: 3 grade system     Malignant neoplasm of upper-inner quadrant of right breast in female, estrogen receptor positive ([place])  08/14/2021 Mammogram   EXAM: DIGITAL DIAGNOSTIC BILATERAL MAMMOGRAM WITH TOMOSYNTHESIS AND CAD; ULTRASOUND RIGHT BREAST LIMITED  IMPRESSION: 1. Suspicious right breast mass at the 1 o'clock position 15 cm from the nipple on the right. It measures 1.1 x 1.3 x 0.9 cm. Recommendation is for ultrasound-guided biopsy. 2. No suspicious right axillary lymphadenopathy. 3. No mammographic evidence of malignancy on the left.   08/20/2021 Pathology Results   Diagnosis Breast, right, needle core biopsy, 1 o'clock, 15 cmfn, ribbon clip - INVASIVE  DUCTAL CARCINOMA - SEE COMMENT  Microscopic Comment Based on the biopsy, the carcinoma appears Nottingham grade 2 of 3 and measures 1.2 cm in greatest linear extent.  PROGNOSTIC INDICATORS Results: The tumor cells are NEGATIVE for Her2 (1+). Estrogen Receptor: 90%, POSITIVE, MODERATE STAINNG INTENSITY Progesterone Receptor: 90%, POSITIVE, STRONG STAINING INTENSITY Proliferation Marker Ki67: 15%   08/20/2021 Cancer Staging   Staging form: Breast, AJCC 8th Edition - Clinical stage from 08/20/2021: Stage IA (cT1c, cN0, cM0, G2, ER+, PR+, HER2-) - Signed by Truitt Merle, MD on 09/02/2021 Method of lymph node assessment: Clinical Histologic grading system: 3 grade system   08/29/2021 Initial Diagnosis   Malignant neoplasm of upper-inner quadrant of right breast in female, estrogen receptor positive ([place])   09/22/2021 Genetic Testing   Negative genetic testing on the CancerNext-Expanded+RNAinsight panel.  The report date is September 20, 2021.  The CancerNext-Expanded gene panel offered by [facility] and includes sequencing and rearrangement analysis for the following 77 genes: AIP, ALK, APC*, ATM*, AXIN2, BAP1, BARD1, BLM, BMPR1A, BRCA1*, BRCA2*, BRIP1*, CDC73, CDH1*, CDK4, CDKN1B, CDKN2A, CHEK2*, CTNNA1, DICER1, FANCC, FH, FLCN, GALNT12, KIF1B, LZTR1, MAX, MEN1, MET, MLH1*, MSH2*, MSH3, MSH6*, MUTYH*, NBN, NF1*, NF2, NTHL1, PALB2*, PHOX2B, PMS2*, POT1, PRKAR1A, PTCH1, PTEN*, RAD51C*, RAD51D*, RB1, RECQL, RET, SDHA, SDHAF2, SDHB, SDHC, SDHD, SMAD4, SMARCA4, SMARCB1, SMARCE1, STK11, SUFU, TMEM127, TP53*, TSC1, TSC2, VHL and XRCC2 (sequencing and deletion/duplication); EGFR, EGLN1, HOXB13, KIT, MITF, PDGFRA, POLD1, and POLE (sequencing only); EPCAM and GREM1 (deletion/duplication only). DNA and RNA analyses performed for * genes.   10/11/2021 Cancer Staging   Staging form: Breast, AJCC 8th Edition - Pathologic stage from 10/11/2021: Stage IA (pT1c, pN0, cM0, G2, ER+, PR+, HER2-, Oncotype DX  score:  16) - Signed by Truitt Merle, MD on 01/08/2022 Stage prefix: Initial diagnosis Multigene prognostic tests performed: Oncotype DX Recurrence score range: Greater than or equal to 11 Histologic grading system: 3 grade system   10/11/2021 Definitive Surgery   FINAL MICROSCOPIC DIAGNOSIS:   A. LYMPH NODE, RIGHT AXILLARY #1, SENTINEL, EXCISION:  - One lymph node negative for metastatic carcinoma (0/1).   B. LYMPH NODE, RIGHT AXILLARY, SENTINEL, EXCISION:  - One lymph node negative for metastatic carcinoma (0/1).   C. LYMPH NODE, RIGHT AXILLARY, SENTINEL, EXCISION:  - One lymph node negative for metastatic carcinoma (0/1).   D. LYMPH NODE, RIGHT AXILLARY #2, SENTINEL, EXCISION:  - One lymph node negative for metastatic carcinoma (0/1).   E. LYMPH NODE, RIGHT AXILLARY, SENTINEL, EXCISION:  - One lymph node negative for metastatic carcinoma (0/1).   F. BREAST, RIGHT, LUMPECTOMY:  - Invasive and in situ ductal carcinoma, 1.4 cm.  - Margins negative for carcinoma.  - Biopsy site and biopsy clip.  - See oncology table.    10/11/2021 Oncotype testing   Oncotype DX was obtained on the final surgical sample and the recurrence score of 16 predicts a risk of recurrence outside the breast over the next 9 years of 4%, if the patient's only systemic therapy is an antiestrogen for 5 years.  It also predicts no significant benefit from chemotherapy.    05/05/2022 Survivorship   SCP delivered by Cira Rue, NP     CURRENT THERAPY: Tamoxifen, beginning before surgery.  Held 5/23-7/23 for depression.  Effexor added and low-dose tamoxifen restarted 05/05/2022  INTERVAL HISTORY: Emily Montoya presents today for symptom management visit.  Approximately 2 weeks ago she palpated a new right breast lump at the upper inner lumpectomy scar with associated tenderness.  At this time she also noted more breast heaviness when getting out of bed in the morning.  It felt warm once but not recently. Denies  redness, fever, chills, nipple discharge or inversion.  Denies injury, strain, or fall.  She continues PT but not sure if it is helping lymphedema.  She continues tamoxifen, tolerating better lately.    MEDICAL HISTORY:  Past Medical History:  Diagnosis Date   Breast cancer ([place]) 08/20/2021   Sickle cell trait ([place])     SURGICAL HISTORY: Past Surgical History:  Procedure Laterality Date   BREAST LUMPECTOMY WITH RADIOACTIVE SEED AND SENTINEL LYMPH NODE BIOPSY Right 10/11/2021   Procedure: RIGHT BREAST LUMPECTOMY WITH RADIOACTIVE SEED LOCALIZATION AND SENTINEL LYMPH NODE BIOPSY;  Surgeon: [name], MD;  Location: [place];  Service: General;  Laterality: Right;   TUBAL LIGATION      I have reviewed the social history and family history with the patient and they are unchanged from previous note.  ALLERGIES:  is allergic to meloxicam.  MEDICATIONS:  Current Outpatient Medications  Medication Sig Dispense Refill   oxyCODONE-acetaminophen (PERCOCET/ROXICET) 5-325 MG tablet Take by mouth every 6 (six) hours as needed for severe pain.     tamoxifen (NOLVADEX) 10 MG tablet TAKE 1 TABLET(10 MG) BY MOUTH DAILY 30 tablet 3   tiZANidine (ZANAFLEX) 4 MG tablet Take 1 tablet (4 mg total) by mouth every 8 (eight) hours as needed for muscle spasms. 40 tablet 1   venlafaxine XR (EFFEXOR-XR) 75 MG 24 hr capsule TAKE 1 CAPSULE BY MOUTH ONCE DAILY WITH BREAKFAST 30 capsule 0   No current facility-administered medications for this visit.    PHYSICAL EXAMINATION:  Vitals:   05/27/22 1207  BP: 117/74  Pulse: 74  Resp: 15  Temp: 98.4 F (36.9 C)  SpO2: 100%   Filed Weights   05/27/22 1207  Weight: 162 lb 8 oz (73.7 kg)    GENERAL:alert, no distress and comfortable SKIN: No rash EYES: sclera clear NECK: Without mass LYMPH:  no palpable cervical or supraclavicular lymphadenopathy  LUNGS:  normal breathing effort HEART: no lower extremity edema NEURO: alert &  oriented x 3 with fluent speech, no focal motor/sensory deficits Breast exam: No bilateral nipple discharge or inversion.  Right breast lymphedema and mild hyperpigmentation.  S/p right lumpectomy, incisions completely healed.  TTP over the upper inner lumpectomy scar overlying superficial subtle density.  No discrete mass, redness, warmth.  Left breast and bilateral axilla was benign  LABORATORY DATA:  I have reviewed the data as listed    Latest Ref Rng & Units 11/11/2021    6:53 PM 02/27/2021    8:16 PM 06/14/2019    6:13 AM  CBC  WBC 4.0 - 10.5 K/uL 6.5  9.2  8.9   Hemoglobin 12.0 - 15.0 g/dL 11.6  11.4  14.0   Hematocrit 36.0 - 46.0 % 36.7  36.1  43.3   Platelets 150 - 400 K/uL 344  319  308         Latest Ref Rng & Units 11/11/2021    6:53 PM 02/27/2021    8:16 PM 06/14/2019    6:13 AM  CMP  Glucose 70 - 99 mg/dL 94  88  92   BUN 6 - 20 mg/dL $Remove'10  10  10   'cTmjVvv$ Creatinine 0.44 - 1.00 mg/dL 0.84  0.82  0.72   Sodium 135 - 145 mmol/L 141  139  140   Potassium 3.5 - 5.1 mmol/L 3.8  3.6  3.3   Chloride 98 - 111 mmol/L 111  110  108   CO2 22 - 32 mmol/L $RemoveB'22  23  20   'nzvKHAdJ$ Calcium 8.9 - 10.3 mg/dL 9.2  8.6  8.8   Total Protein 6.5 - 8.1 g/dL   6.8   Total Bilirubin 0.3 - 1.2 mg/dL   0.6   Alkaline Phos 38 - 126 U/L   53   AST 15 - 41 U/L   22   ALT 0 - 44 U/L   15       RADIOGRAPHIC STUDIES: I have personally reviewed the radiological images as listed and agreed with the findings in the report. No results found.   ASSESSMENT & PLAN: 51 yo female     1.Right breast pain, heaviness, ?lump -Onset 2 weeks ago during PT for lymphedema -Emily Montoya felt a lump at the upper inner lumpectomy scar with associated pain at the site and diffuse breast heaviness when waking up -no strong clinical evidence of cellulitis -exam shows ttp at the lumpectomy scar with subtle density underneath. We discussed this is likely seroma or scar tissue. Will obtain diagnostic R mammo/US to r/o abscess. My  suspicion for cancer recurrence is not high -I will call her with results -may want to hold PT for pain, but ok to continue if she tolerates   2. Malignant neoplasm of upper-inner quadrant of right breast, Stage IA, p(T1c, N0), ER+/PR+/HER2-, Grade 2  -presented with palpable right breast lump x6 months. Biopsy 08/20/21 showed IDC, grade 2, Ki67 of 15%. -she was started on tamoxifen neoadjuvantly due to heavy menses. -S/p right lumpectomy on 10/11/21 by Dr. Marlou Starks showed 1.4 cm invasive and in situ ductal  carcinoma. Margins and lymph nodes negative. -oncotype RS of 16, low risk, Adjuvant chemo is not recommended  -She resumed tamoxifen in early 10/2021, held 02/2022 for depression/mood swings. Effexor started and depression improved.  -Resumed low dose tamoxifen 10 mg 05/05/22, tolerating better -on surveillance, b/l mammo due in 07/2022  3. Genetics -testing was negative 09/05/21  PLAN: -R mammo[facility] 05/31/22 at [facility] for pain and ?lump, I will call pt with results -if negative, next surveillance visit 08/08/22 -continue tamoxifen, tolerating low dose -b/l mammo due in October    Orders Placed This Encounter  Procedures   MM DIAG BREAST TOMO UNI RIGHT    Standing Status:   Future    Standing Expiration Date:   05/28/2023    Order Specific Question:   Reason for Exam (SYMPTOM  OR DIAGNOSIS REQUIRED)    Answer:   h/o R breast cancer 10/22 s/p lumpectomy and RT. 2 weeks new pain, ? lump near inner breast scar    Order Specific Question:   Is the patient pregnant?    Answer:   No    Order Specific Question:   Preferred imaging location?    Answer:   [facility]   US BREAST LTD UNI RIGHT INC AXILLA    Standing Status:   Future    Standing Expiration Date:   05/28/2023    Order Specific Question:   Reason for Exam (SYMPTOM  OR DIAGNOSIS REQUIRED)    Answer:   h/o R breast cancer 10/22 s/p lumpectomy and RT. 2 wk h/o new pain, ? lump near inner breast scar    Order Specific  Question:   Preferred imaging location?    Answer:   [facility]   All questions were answered. The patient knows to call the clinic with any problems, questions or concerns. No barriers to learning was detected. I spent 20 minutes counseling the patient face to face. The total time spent in the appointment was 30 minutes and more than 50% was on counseling and coordination of care.      Alla Feeling, NP 05/27/22

## 2022-05-30 ENCOUNTER — Other Ambulatory Visit: Payer: Self-pay | Admitting: Hematology

## 2022-05-31 ENCOUNTER — Ambulatory Visit
Admission: RE | Admit: 2022-05-31 | Discharge: 2022-05-31 | Disposition: A | Discharge status: Home / Self Care | Payer: BC Managed Care – PPO | Source: Ambulatory Visit | Attending: Nurse Practitioner | Admitting: Nurse Practitioner

## 2022-05-31 DIAGNOSIS — Z170 Estrogen receptor positive status [ER+]: Secondary | ICD-10-CM

## 2022-05-31 DIAGNOSIS — N644 Mastodynia: Secondary | ICD-10-CM

## 2022-05-31 DIAGNOSIS — N6311 Unspecified lump in the right breast, upper outer quadrant: Secondary | ICD-10-CM | POA: Diagnosis not present

## 2022-06-01 ENCOUNTER — Encounter: Payer: Self-pay | Admitting: Nurse Practitioner

## 2022-06-03 ENCOUNTER — Ambulatory Visit: Payer: BC Managed Care – PPO | Admitting: Rehabilitation

## 2022-06-04 ENCOUNTER — Ambulatory Visit (HOSPITAL_BASED_OUTPATIENT_CLINIC_OR_DEPARTMENT_OTHER): Payer: BC Managed Care – PPO | Admitting: Orthopaedic Surgery

## 2022-06-04 ENCOUNTER — Ambulatory Visit: Payer: BC Managed Care – PPO | Admitting: Physical Therapy

## 2022-06-05 ENCOUNTER — Ambulatory Visit: Payer: BC Managed Care – PPO | Admitting: Rehabilitation

## 2022-06-05 ENCOUNTER — Ambulatory Visit: Payer: BC Managed Care – PPO | Admitting: Psychologist

## 2022-06-06 ENCOUNTER — Ambulatory Visit (INDEPENDENT_AMBULATORY_CARE_PROVIDER_SITE_OTHER): Payer: BC Managed Care – PPO

## 2022-06-06 ENCOUNTER — Ambulatory Visit (INDEPENDENT_AMBULATORY_CARE_PROVIDER_SITE_OTHER): Payer: BC Managed Care – PPO | Admitting: Orthopaedic Surgery

## 2022-06-06 DIAGNOSIS — M25511 Pain in right shoulder: Secondary | ICD-10-CM | POA: Diagnosis not present

## 2022-06-06 DIAGNOSIS — S46001A Unspecified injury of muscle(s) and tendon(s) of the rotator cuff of right shoulder, initial encounter: Secondary | ICD-10-CM

## 2022-06-06 DIAGNOSIS — G8929 Other chronic pain: Secondary | ICD-10-CM

## 2022-06-06 DIAGNOSIS — M19012 Primary osteoarthritis, left shoulder: Secondary | ICD-10-CM | POA: Diagnosis not present

## 2022-06-06 DIAGNOSIS — S46002A Unspecified injury of muscle(s) and tendon(s) of the rotator cuff of left shoulder, initial encounter: Secondary | ICD-10-CM | POA: Diagnosis not present

## 2022-06-06 DIAGNOSIS — M25512 Pain in left shoulder: Secondary | ICD-10-CM | POA: Diagnosis not present

## 2022-06-06 DIAGNOSIS — M19011 Primary osteoarthritis, right shoulder: Secondary | ICD-10-CM | POA: Diagnosis not present

## 2022-06-06 NOTE — Progress Notes (Signed)
Chief Complaint: Right shoulder pain     History of Present Illness:   06/06/2022: Presents today for follow-up of her bilateral shoulders.  She continues to experience pain and limited range of motion about the bilateral shoulders although the right is worse than the left.  He is continue to work on physical therapy status post breast procedure.  She does have a cervical anterior decompression pending in the setting of radiculopathy down the arm.  At this time she is quite frustrated about her limited overhead range of motion about the right shoulder.  Emily Montoya is a 51 y.o. female right female presents with right shoulder pain that is been ongoing now for several months.  She is here today as a referral from partner Dr. Ninfa Linden.  He has previously performed a subacromial injection although he has subsequently referred her for glenohumeral type injection.  She has very limited range of motion following a lymph node dissection on the right side in December 2022.  She endorses quite limited motion as well as shoulder pain and difficulty lying directly on the side.    Surgical History:   None  PMH/PSH/Family History/Social History/Meds/Allergies:    Past Medical History:  Diagnosis Date   Breast cancer ([place]) 08/20/2021   Sickle cell trait ([place])    Past Surgical History:  Procedure Laterality Date   BREAST LUMPECTOMY WITH RADIOACTIVE SEED AND SENTINEL LYMPH NODE BIOPSY Right 10/11/2021   Procedure: RIGHT BREAST LUMPECTOMY WITH RADIOACTIVE SEED LOCALIZATION AND SENTINEL LYMPH NODE BIOPSY;  Surgeon: [name], MD;  Location: [facility];  Service: General;  Laterality: Right;   TUBAL LIGATION     Social History   Socioeconomic History   Marital status: Legally Separated    Spouse name: Not on file   Number of children: 3   Years of education: Not on file   Highest education level: Not on file  Occupational History   Not  on file  Tobacco Use   Smoking status: Former    Packs/day: 0.25    Years: 12.00    Total pack years: 3.00    Types: Cigarettes    Quit date: 08/22/2021    Years since quitting: 0.7   Smokeless tobacco: Never  Vaping Use   Vaping Use: Never used  Substance and Sexual Activity   Alcohol use: Yes    Comment: Occasional Beers   Drug use: Never   Sexual activity: Not on file    Comment: s/p tubal ligation  Other Topics Concern   Not on file  Social History Narrative   Not on file   Social Determinants of Health   Financial Resource Strain: Not on file  Food Insecurity: No Food Insecurity (03/13/2022)   Hunger Vital Sign    Worried About Running Out of Food in the Last Year: Never true    Ran Out of Food in the Last Year: Never true  Transportation Needs: No Transportation Needs (03/13/2022)   PRAPARE - Hydrologist (Medical): No    Lack of Transportation (Non-Medical): No  Physical Activity: Unknown (03/13/2022)   Exercise Vital Sign    Days of Exercise per Week: Not on file    Minutes of Exercise per Session: 0 min  Stress: Not on file  Social Connections: Socially  Isolated (03/13/2022)   Social Connection and Isolation Panel [NHANES]    Frequency of Communication with Friends and Family: Once a week    Frequency of Social Gatherings with Friends and Family: Once a week    Attends Religious Services: Never    Marine scientist or Organizations: No    Attends Music therapist: Never    Marital Status: Separated   Family History  Problem Relation Age of Onset   HIV/AIDS Mother    Cancer Maternal Grandmother        Throat   Allergies  Allergen Reactions   Meloxicam Swelling   Current Outpatient Medications  Medication Sig Dispense Refill   oxyCODONE-acetaminophen (PERCOCET/ROXICET) 5-325 MG tablet Take by mouth every 6 (six) hours as needed for severe pain.     tamoxifen (NOLVADEX) 10 MG tablet TAKE 1 TABLET(10 MG) BY  MOUTH DAILY 30 tablet 3   tiZANidine (ZANAFLEX) 4 MG tablet Take 1 tablet (4 mg total) by mouth every 8 (eight) hours as needed for muscle spasms. 40 tablet 1   venlafaxine XR (EFFEXOR-XR) 75 MG 24 hr capsule TAKE 1 CAPSULE BY MOUTH ONCE DAILY WITH BREAKFAST 30 capsule 0   No current facility-administered medications for this visit.   No results found.  Review of Systems:   A ROS was performed including pertinent positives and negatives as documented in the HPI.  Physical Exam :   Constitutional: NAD and appears stated age Neurological: Alert and oriented Psych: Appropriate affect and cooperative There were no vitals taken for this visit.   Comprehensive Musculoskeletal Exam:    Musculoskeletal Exam    Inspection Right Left  Skin No atrophy or winging No atrophy or winging  Palpation    Tenderness GH Glenohumeral  Range of Motion    Flexion (passive) 100 170  Flexion (active) 45 with pain 170  Abduction 40 with pain 170  ER at the side 20 70  Can reach behind back to Back pocket T12  Strength     Not tested Full  Special Tests    Pseudoparalytic No No  Neurologic    Fires PIN, radial, median, ulnar, musculocutaneous, axillary, suprascapular, long thoracic, and spinal accessory innervated muscles. No abnormal sensibility  Vascular/Lymphatic    Radial Pulse 2+ 2+  Cervical Exam    Patient has symmetric cervical range of motion with negative Spurling's test.  Special Test:      Imaging:   Xray (3 views right shoulder, left shoulder 3 views): Normal   I personally reviewed and interpreted the radiographs.   Assessment:   51 y.o. female right-hand-dominant presents with right shoulder pain consistent with adhesive capsulitis.  While the right side is more progressed than the left I do believe that she also has this going on on the left as well.  Given the fact that she has failed now physical therapy and 3 injections in the shoulder I do believe that she would be a  candidate for MRIs of both shoulders.  We will plan on discussion of possible surgical release when she has completed her cervical procedure Plan :    -Return to clinic following MRI both shoulders    I personally saw and evaluated the patient, and participated in the management and treatment plan.  Vanetta Mulders, MD Attending Physician, Orthopedic Surgery  This document was dictated using Dragon voice recognition software. A reasonable attempt at proof reading has been made to minimize errors.

## 2022-06-10 ENCOUNTER — Encounter: Payer: Self-pay | Admitting: Rehabilitation

## 2022-06-10 ENCOUNTER — Ambulatory Visit: Payer: BC Managed Care – PPO | Admitting: Rehabilitation

## 2022-06-10 DIAGNOSIS — M25611 Stiffness of right shoulder, not elsewhere classified: Secondary | ICD-10-CM | POA: Diagnosis not present

## 2022-06-10 DIAGNOSIS — M25612 Stiffness of left shoulder, not elsewhere classified: Secondary | ICD-10-CM

## 2022-06-10 DIAGNOSIS — Z170 Estrogen receptor positive status [ER+]: Secondary | ICD-10-CM

## 2022-06-10 DIAGNOSIS — I890 Lymphedema, not elsewhere classified: Secondary | ICD-10-CM | POA: Diagnosis not present

## 2022-06-10 DIAGNOSIS — R293 Abnormal posture: Secondary | ICD-10-CM | POA: Diagnosis not present

## 2022-06-10 DIAGNOSIS — M25621 Stiffness of right elbow, not elsewhere classified: Secondary | ICD-10-CM | POA: Diagnosis not present

## 2022-06-10 DIAGNOSIS — Z483 Aftercare following surgery for neoplasm: Secondary | ICD-10-CM | POA: Diagnosis not present

## 2022-06-10 DIAGNOSIS — M25511 Pain in right shoulder: Secondary | ICD-10-CM | POA: Diagnosis not present

## 2022-06-10 DIAGNOSIS — C50211 Malignant neoplasm of upper-inner quadrant of right female breast: Secondary | ICD-10-CM | POA: Diagnosis not present

## 2022-06-10 NOTE — Therapy (Addendum)
OUTPATIENT PHYSICAL THERAPY TREATMENT NOTE   Patient Name: Emily Montoya MRN: [id number] DOB:07-14-1971, 51 y.o., female Today's Date: 06/10/2022  PCP: Wenda Low, MD REFERRING PROVIDER: Truitt Merle, MD  END OF SESSION:   PT End of Session - 06/10/22 0903     Visit Number 3    Number of Visits 9    Date for PT Re-Evaluation 06/16/22    PT Start Time 0905    PT Stop Time 0949    PT Time Calculation (min) 44 min    Activity Tolerance Patient tolerated treatment well;Patient limited by pain    Behavior During Therapy [facility] for tasks assessed/performed             Past Medical History:  Diagnosis Date   Breast cancer ([place]) 08/20/2021   Sickle cell trait ([place])    Past Surgical History:  Procedure Laterality Date   BREAST LUMPECTOMY WITH RADIOACTIVE SEED AND SENTINEL LYMPH NODE BIOPSY Right 10/11/2021   Procedure: RIGHT BREAST LUMPECTOMY WITH RADIOACTIVE SEED LOCALIZATION AND SENTINEL LYMPH NODE BIOPSY;  Surgeon: [name], MD;  Location: [facility];  Service: General;  Laterality: Right;   TUBAL LIGATION     Patient Active Problem List   Diagnosis Date Noted   Genetic testing 09/23/2021   Malignant neoplasm of upper-inner quadrant of right breast in female, estrogen receptor positive ([place]) 08/29/2021    REFERRING DIAG: C50.211,Z17.0 (ICD-10-CM) - Malignant neoplasm of upper-inner quadrant of right breast in female, estrogen receptor positive ([place])  THERAPY DIAG: Lymphedema, not elsewhere classified  Stiffness of left shoulder, not elsewhere classified  Stiffness of right shoulder, not elsewhere classified  Abnormal posture  Malignant neoplasm of upper-inner quadrant of right breast in female, estrogen receptor positive ([place])  Aftercare following surgery for neoplasm  Rationale for Evaluation and Treatment Rehabilitation  PERTINENT HISTORY: R breast cancer ER+PR+, R breast lumpectomy and SLNB (0/7) on 10/11/21   PRECAUTIONS:  Other: R  frozen shoulder, 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  I'm not sure what they are going to do.  They are going to do MRI of both shoulders soon to see if she needs a release.  The breast still feels heavy when I wake up and still feel like the pillow works in armpit.    PAIN:  Are you having pain? YES  7/10 both arms.     OBJECTIVE: (objective measures completed at initial evaluation unless otherwise dated)   PALPATION: Increased fibrosis in medial inferior R breast and increased scar tissue in area of R lumpectomy scar   OBSERVATIONS / OTHER ASSESSMENTS: R breast more swollen with increased pore size noted      POSTURE: forward head, rounded shoulders   UPPER EXTREMITY AROM/PROM:   A/PROM RIGHT   eval    Shoulder extension    Shoulder flexion 96  Shoulder abduction 56  Shoulder internal rotation    Shoulder external rotation    Elbow extension 21 from 0                               (Blank rows = not tested)   A/PROM LEFT   eval  Shoulder extension Not tested due to hx of cervical radiculopathy that requires surgery  Shoulder flexion   Shoulder abduction   Shoulder internal rotation   Shoulder external rotation                           (  Blank rows = not tested)     LYMPHEDEMA ASSESSMENTS:   SURGERY TYPE/DATE: R breast lumpectomy and SLNB 10/11/21  NUMBER OF LYMPH NODES REMOVED: 0/7  CHEMOTHERAPY: did not require  RADIATION:completed in 2023  HORMONE TREATMENT: currently on tamoxifen  INFECTIONS: none   LYMPHEDEMA ASSESSMENTS:    LANDMARK RIGHT  eval  10 cm proximal to olecranon process 30  Olecranon process 26.5  10 cm proximal to ulnar styloid process 20.7  Just proximal to ulnar styloid process 15.7  Across hand at thumb web space 19.5  At base of 2nd digit 5.8  (Blank rows = not tested)   LANDMARK LEFT  eval  10 cm proximal to olecranon process 29  Olecranon process 26  10 cm  proximal to ulnar styloid process 20.2  Just proximal to ulnar styloid process 15.5  Across hand at thumb web space 19.1  At base of 2nd digit 6.5  (Blank rows = not tested)   TODAY'S TREATMENT  06/10/22 05/27/22: Manual Therapy MLD in supine as follows: short neck, 5 diaphragmatic breaths, bil axillary nodes and establishment of anterior inter-axillary pathway but much difficulty lifting arm to reach into axilla, R inguinal nodes and establishment of Rt axillo-inguinal pathway (pt unable to move arm off trunk), R breast moving fluid towards pathways then retracing all steps while reviewing basic principles of MLD and anatomy and physiology of the lymphatic system Attempted P/ROM of Rt elbow and shoulder as pt could tolerate but really did not tolerate this at all.  Reviewed self stretches now with surgery coming up on Friday.  - Pt would like to come Thursday.    05/27/22: Manual Therapy MLD in supine as follows: short neck, 5 diaphragmatic breaths, L axillary nodes and establishment of anterior inter-axillary pathway, R inguinal nodes and establishment of Rt axillo-inguinal pathway, R breast moving fluid towards pathways then retracing all steps while reviewing basic principles of MLD and anatomy and physiology of the lymphatic system P/ROM of Rt elbow and shoulder as pt could tolerate, pt struggles with relaxing due to max muscle guarding; contract/relax to promote elbow extension and shoulder flexion, 3x each to her tolerance STM gently at Rt axilla where pt mod tender to touch, to bicep during elbow P/ROM; then in Lt S/L with cocoa butter to Rt medial scapular border and upper trap  05/19/22:  MLD in supine as follows: short neck, 5 diaphragmatic breaths, L axillary nodes and establishment of interaxillary pathway, R inguinal nodes and establishment of axilloinguinal pathway, R breast moving fluid towards pathways then retracing all steps while educating pt in basic principles of MLD and anatomy  and physiology of the lymphatic system Created foam chip pack for pt to wear in her bra on the lateral side of her breast Scar mobilization to R lumpectomy scar   PATIENT EDUCATION:  Education details: lymphedema, anatomy and physiology of the lymphatic system, need for compression Person educated: Patient Education method: Explanation Education comprehension: verbalized understanding     HOME EXERCISE PROGRAM: Scar mobilization, wear compression bra with chip pack daily   ASSESSMENT:   CLINICAL IMPRESSION: Pt will be having cervical decompression and fusion? Friday and is now possibly going to have bilateral shoulder surgery to break up the frozen shoulder.  Was mostly able to do breast MLD today.   Continued encouraging her to work on relaxing her Rt upper quadrant by trying to "drop" her Rt shoulder by pulling scapula downwards and working on relaxing her Rt arm/elbow when arm is at  her side.       OBJECTIVE IMPAIRMENTS decreased knowledge of condition, decreased knowledge of use of DME, decreased ROM, decreased strength, increased edema, increased fascial restrictions, increased muscle spasms, impaired UE functional use, postural dysfunction, and pain.    ACTIVITY LIMITATIONS carrying, lifting, dressing, reach over head, and hygiene/grooming   PARTICIPATION LIMITATIONS: meal prep, cleaning, laundry, driving, shopping, occupation, and yard work   PERSONAL FACTORS Time since onset of injury/illness/exacerbation and 1 comorbidity: L UE radiculopathy  are also affecting patient's functional outcome.    REHAB POTENTIAL: Good   CLINICAL DECISION MAKING: Evolving/moderate complexity   EVALUATION COMPLEXITY: Moderate   GOALS: Goals reviewed with patient? Yes   SHORT TERM GOALS=LONG TERM GOALS  Target date: 06/16/2022     Pt will demonstrate 150 degrees of L shoulder flexion to allow her to reach overhead.  Baseline: 96 Goal status: INITIAL   2.  Pt will demonstrate 125 degrees  of L shoulder abduction to allow her to reach out to the side.  Baseline: 56 Goal status: INITIAL   3.  Pt will be independent in self MLD for long term management of lymphedema.  Baseline:  Goal status: INITIAL   4.  Pt will report a 50% improvement in discomfort and swelling in R breast to allow improved comfort. Baseline:  Goal status: INITIAL   5.  Pt will obtain appropriate compression garment for long term management of R breast lymphedema.  Baseline:  Goal status: INITIAL   6.  Pt will be independent in a home exercise program for long term stretching and strengthening.  Baseline:  Goal status: INITIAL     PLAN: PT FREQUENCY: 2x/week   PT DURATION: 4 weeks   PLANNED INTERVENTIONS: Therapeutic exercises, Therapeutic activity, Patient/Family education, Self Care, Joint mobilization, Orthotic/Fit training, Manual lymph drainage, Compression bandaging, scar mobilization, Vasopneumatic device, Ionotophoresis 37m/ml Dexamethasone, and Manual therapy   PLAN FOR NEXT SESSION: PROM to R shoulder and elbow, MLD to R breast and instruct pt, how is gentle A/ROM of Rt UE going?     TStark Bray PT 06/10/2022, 9:57 AM  PHYSICAL THERAPY DISCHARGE SUMMARY  Visits from Start of Care: 3  Current functional level related to goals / functional outcomes: Limited due to cervical issues and frozen shoulder   Remaining deficits: Per above   Education / Equipment: HEP only  Plan: Patient agrees to discharge.  Patient goals were not met.    KShan Levans PT

## 2022-06-12 ENCOUNTER — Ambulatory Visit: Payer: BC Managed Care – PPO

## 2022-06-12 DIAGNOSIS — M25621 Stiffness of right elbow, not elsewhere classified: Secondary | ICD-10-CM | POA: Diagnosis not present

## 2022-06-12 DIAGNOSIS — R293 Abnormal posture: Secondary | ICD-10-CM | POA: Diagnosis not present

## 2022-06-12 DIAGNOSIS — M25611 Stiffness of right shoulder, not elsewhere classified: Secondary | ICD-10-CM

## 2022-06-12 DIAGNOSIS — Z483 Aftercare following surgery for neoplasm: Secondary | ICD-10-CM

## 2022-06-12 DIAGNOSIS — M25511 Pain in right shoulder: Secondary | ICD-10-CM | POA: Diagnosis not present

## 2022-06-12 DIAGNOSIS — M25612 Stiffness of left shoulder, not elsewhere classified: Secondary | ICD-10-CM | POA: Diagnosis not present

## 2022-06-12 DIAGNOSIS — I890 Lymphedema, not elsewhere classified: Secondary | ICD-10-CM

## 2022-06-12 DIAGNOSIS — Z170 Estrogen receptor positive status [ER+]: Secondary | ICD-10-CM | POA: Diagnosis not present

## 2022-06-12 DIAGNOSIS — C50211 Malignant neoplasm of upper-inner quadrant of right female breast: Secondary | ICD-10-CM | POA: Diagnosis not present

## 2022-06-12 NOTE — Therapy (Signed)
OUTPATIENT PHYSICAL THERAPY TREATMENT NOTE   Patient Name: Emily Montoya MRN: [id number] DOB:[birth year], 51 y.o., female Today's Date: 06/12/2022  PCP: Wenda Low, MD REFERRING PROVIDER: Truitt Merle, MD  END OF SESSION:   PT End of Session - 06/12/22 0914     Visit Number 4    Number of Visits 9    Date for PT Re-Evaluation 06/16/22    PT Start Time 0909    PT Stop Time 1004    PT Time Calculation (min) 55 min    Activity Tolerance Patient tolerated treatment well;Patient limited by pain    Behavior During Therapy [facility] for tasks assessed/performed             Past Medical History:  Diagnosis Date   Breast cancer ([place]) 08/20/2021   Sickle cell trait ([place])    Past Surgical History:  Procedure Laterality Date   BREAST LUMPECTOMY WITH RADIOACTIVE SEED AND SENTINEL LYMPH NODE BIOPSY Right 10/11/2021   Procedure: RIGHT BREAST LUMPECTOMY WITH RADIOACTIVE SEED LOCALIZATION AND SENTINEL LYMPH NODE BIOPSY;  Surgeon: [name], MD;  Location: [place];  Service: General;  Laterality: Right;   TUBAL LIGATION     Patient Active Problem List   Diagnosis Date Noted   Genetic testing 09/23/2021   Malignant neoplasm of upper-inner quadrant of right breast in female, estrogen receptor positive ([place]) 08/29/2021    REFERRING DIAG: C50.211,Z17.0 (ICD-10-CM) - Malignant neoplasm of upper-inner quadrant of right breast in female, estrogen receptor positive ([place])  THERAPY DIAG: Lymphedema, not elsewhere classified  Stiffness of left shoulder, not elsewhere classified  Stiffness of right shoulder, not elsewhere classified  Abnormal posture  Malignant neoplasm of upper-inner quadrant of right breast in female, estrogen receptor positive ([place])  Aftercare following surgery for neoplasm  Rationale for Evaluation and Treatment Rehabilitation  PERTINENT HISTORY: R breast cancer ER+PR+, R breast lumpectomy and SLNB (0/7) on 10/11/21   PRECAUTIONS:  Other: R  frozen shoulder, unable to fully extend R elbow, Lymphedema R breast  SUBJECTIVE: I'm ready to have my surgery tomorrow. I'll talk to the surgeon about when I can return to physical therapy because I know I'll need it.   PAIN:  Are you having pain? YES  4-5/10 both arms; just aches all the time   OBJECTIVE: (objective measures completed at initial evaluation unless otherwise dated)   PALPATION: Increased fibrosis in medial inferior R breast and increased scar tissue in area of R lumpectomy scar   OBSERVATIONS / OTHER ASSESSMENTS: R breast more swollen with increased pore size noted      POSTURE: forward head, rounded shoulders   UPPER EXTREMITY AROM/PROM:   A/PROM RIGHT   eval    Shoulder extension    Shoulder flexion 96  Shoulder abduction 56  Shoulder internal rotation    Shoulder external rotation    Elbow extension 21 from 0                               (Blank rows = not tested)   A/PROM LEFT   eval  Shoulder extension Not tested due to hx of cervical radiculopathy that requires surgery  Shoulder flexion   Shoulder abduction   Shoulder internal rotation   Shoulder external rotation                           (Blank rows = not tested)  LYMPHEDEMA ASSESSMENTS:   SURGERY TYPE/DATE: R breast lumpectomy and SLNB 10/11/21  NUMBER OF LYMPH NODES REMOVED: 0/7  CHEMOTHERAPY: did not require  RADIATION:completed in 2023  HORMONE TREATMENT: currently on tamoxifen  INFECTIONS: none   LYMPHEDEMA ASSESSMENTS:    LANDMARK RIGHT  eval  10 cm proximal to olecranon process 30  Olecranon process 26.5  10 cm proximal to ulnar styloid process 20.7  Just proximal to ulnar styloid process 15.7  Across hand at thumb web space 19.5  At base of 2nd digit 5.8  (Blank rows = not tested)   LANDMARK LEFT  eval  10 cm proximal to olecranon process 29  Olecranon process 26  10 cm proximal to ulnar styloid process 20.2  Just proximal to ulnar styloid process 15.5  Across  hand at thumb web space 19.1  At base of 2nd digit 6.5  (Blank rows = not tested)   TODAY'S TREATMENT  06/12/22 Manual Therapy MLD in supine as follows: short neck, 5 diaphragmatic breaths, Lt axillary nodes and establishment of anterior inter-axillary pathway but much difficulty lifting arm to reach into axilla, R inguinal nodes and establishment of Rt axillo-inguinal pathway (pt unable to move arm off trunk), R breast moving fluid towards pathways then retracing all steps Attempted P/ROM of Rt elbow and shoulder as pt could tolerate but really did not tolerate this at all. STM: seated edge of bed with cocoa butter to bil medial scapulae very gently, pt very tender to touch here; also to bil UT's where pt still very tight but not as tender  06/10/22 Manual Therapy MLD in supine as follows: short neck, 5 diaphragmatic breaths, bil axillary nodes and establishment of anterior inter-axillary pathway but much difficulty lifting arm to reach into axilla, R inguinal nodes and establishment of Rt axillo-inguinal pathway (pt unable to move arm off trunk), R breast moving fluid towards pathways then retracing all steps while reviewing basic principles of MLD and anatomy and physiology of the lymphatic system Attempted P/ROM of Rt elbow and shoulder as pt could tolerate but really did not tolerate this at all.  Reviewed self stretches now with surgery coming up on Friday.  - Pt would like to come Thursday.    05/27/22: Manual Therapy MLD in supine as follows: short neck, 5 diaphragmatic breaths, L axillary nodes and establishment of anterior inter-axillary pathway, R inguinal nodes and establishment of Rt axillo-inguinal pathway, R breast moving fluid towards pathways then retracing all steps while reviewing basic principles of MLD and anatomy and physiology of the lymphatic system P/ROM of Rt elbow and shoulder as pt could tolerate, pt struggles with relaxing due to max muscle guarding; contract/relax to  promote elbow extension and shoulder flexion, 3x each to her tolerance STM gently at Rt axilla where pt mod tender to touch, to bicep during elbow P/ROM; then in Lt S/L with cocoa butter to Rt medial scapular border and upper trap    PATIENT EDUCATION:  Education details: lymphedema, anatomy and physiology of the lymphatic system, need for compression Person educated: Patient Education method: Explanation Education comprehension: verbalized understanding     HOME EXERCISE PROGRAM: Scar mobilization, wear compression bra with chip pack daily   ASSESSMENT:   CLINICAL IMPRESSION: Continued with manual lymph drainage to Rt breast. Also continued to attempt Rt UE P/ROM but pt unable to tolerate due to pain and unable to relax due to max muscle guarding. Also included STM to bil UT and medial scapulae for muscle relaxation.  OBJECTIVE IMPAIRMENTS decreased knowledge of condition, decreased knowledge of use of DME, decreased ROM, decreased strength, increased edema, increased fascial restrictions, increased muscle spasms, impaired UE functional use, postural dysfunction, and pain.    ACTIVITY LIMITATIONS carrying, lifting, dressing, reach over head, and hygiene/grooming   PARTICIPATION LIMITATIONS: meal prep, cleaning, laundry, driving, shopping, occupation, and yard work   PERSONAL FACTORS Time since onset of injury/illness/exacerbation and 1 comorbidity: L UE radiculopathy  are also affecting patient's functional outcome.    REHAB POTENTIAL: Good   CLINICAL DECISION MAKING: Evolving/moderate complexity   EVALUATION COMPLEXITY: Moderate   GOALS: Goals reviewed with patient? Yes   SHORT TERM GOALS=LONG TERM GOALS  Target date: 06/16/2022     Pt will demonstrate 150 degrees of L shoulder flexion to allow her to reach overhead.  Baseline: 96 Goal status: UNMET; pt is having surgery tomorrow - 06/12/22   2.  Pt will demonstrate 125 degrees of L shoulder abduction to allow her to  reach out to the side.  Baseline: 56 Goal status: UNMET; pt is having surgery tomorrow - 06/12/22   3.  Pt will be independent in self MLD for long term management of lymphedema.  Baseline:  Goal status: UNMET; pt unable to perform this due to increased Lt UE pain that she is having surgery for   4.  Pt will report a 50% improvement in discomfort and swelling in R breast to allow improved comfort. Baseline:  Goal status: PARTIALLY MET; pt reports this has improved some but did not rate - 06/12/22   5.  Pt will obtain appropriate compression garment for long term management of R breast lymphedema.  Baseline:  Goal status: MET; pt has a compression bra and foam she wears in her bra   6.  Pt will be independent in a home exercise program for long term stretching and strengthening.  Baseline:  Goal status: UNMET; pt unable to perform HEP for bil UE strength due to increased pain and weakness      PLAN: PT FREQUENCY: 2x/week   PT DURATION: 4 weeks   PLANNED INTERVENTIONS: Therapeutic exercises, Therapeutic activity, Patient/Family education, Self Care, Joint mobilization, Orthotic/Fit training, Manual lymph drainage, Compression bandaging, scar mobilization, Vasopneumatic device, Ionotophoresis 4mg /ml Dexamethasone, and Manual therapy   PLAN FOR NEXT SESSION: Pt on hold due to having surgery 06/13/22; knows she needs a new order if she is to return after surgery   Otelia Limes, PTA 06/12/2022, 10:53 AM

## 2022-06-13 DIAGNOSIS — M4802 Spinal stenosis, cervical region: Secondary | ICD-10-CM | POA: Diagnosis not present

## 2022-06-13 DIAGNOSIS — M5412 Radiculopathy, cervical region: Secondary | ICD-10-CM | POA: Diagnosis not present

## 2022-06-16 ENCOUNTER — Encounter: Payer: Self-pay | Admitting: Physical Therapy

## 2022-06-18 ENCOUNTER — Encounter: Payer: Self-pay | Admitting: Physical Therapy

## 2022-06-26 DIAGNOSIS — R058 Other specified cough: Secondary | ICD-10-CM | POA: Diagnosis not present

## 2022-06-27 ENCOUNTER — Other Ambulatory Visit: Payer: Self-pay

## 2022-06-27 ENCOUNTER — Emergency Department (HOSPITAL_BASED_OUTPATIENT_CLINIC_OR_DEPARTMENT_OTHER)
Admission: EM | Admit: 2022-06-27 | Discharge: 2022-06-27 | Disposition: A | Discharge status: Home / Self Care | Payer: BC Managed Care – PPO | Attending: Emergency Medicine | Admitting: Emergency Medicine

## 2022-06-27 ENCOUNTER — Emergency Department (HOSPITAL_BASED_OUTPATIENT_CLINIC_OR_DEPARTMENT_OTHER): Payer: BC Managed Care – PPO

## 2022-06-27 ENCOUNTER — Encounter (HOSPITAL_BASED_OUTPATIENT_CLINIC_OR_DEPARTMENT_OTHER): Payer: Self-pay

## 2022-06-27 ENCOUNTER — Other Ambulatory Visit: Payer: Self-pay | Admitting: Hematology

## 2022-06-27 DIAGNOSIS — Z20822 Contact with and (suspected) exposure to covid-19: Secondary | ICD-10-CM | POA: Diagnosis not present

## 2022-06-27 DIAGNOSIS — R0602 Shortness of breath: Secondary | ICD-10-CM | POA: Diagnosis not present

## 2022-06-27 DIAGNOSIS — R059 Cough, unspecified: Secondary | ICD-10-CM | POA: Diagnosis not present

## 2022-06-27 DIAGNOSIS — J189 Pneumonia, unspecified organism: Secondary | ICD-10-CM | POA: Diagnosis not present

## 2022-06-27 DIAGNOSIS — R0600 Dyspnea, unspecified: Secondary | ICD-10-CM | POA: Diagnosis not present

## 2022-06-27 DIAGNOSIS — J90 Pleural effusion, not elsewhere classified: Secondary | ICD-10-CM | POA: Diagnosis not present

## 2022-06-27 DIAGNOSIS — R918 Other nonspecific abnormal finding of lung field: Secondary | ICD-10-CM | POA: Diagnosis not present

## 2022-06-27 LAB — CBC WITH DIFFERENTIAL/PLATELET
Abs Immature Granulocytes: 0.13 10*3/uL — ABNORMAL HIGH (ref 0.00–0.07)
Basophils Absolute: 0 10*3/uL (ref 0.0–0.1)
Basophils Relative: 0 %
Eosinophils Absolute: 0.1 10*3/uL (ref 0.0–0.5)
Eosinophils Relative: 1 %
HCT: 37.4 % (ref 36.0–46.0)
Hemoglobin: 12.2 g/dL (ref 12.0–15.0)
Immature Granulocytes: 1 %
Lymphocytes Relative: 19 %
Lymphs Abs: 1.9 10*3/uL (ref 0.7–4.0)
MCH: 26.5 pg (ref 26.0–34.0)
MCHC: 32.6 g/dL (ref 30.0–36.0)
MCV: 81.3 fL (ref 80.0–100.0)
Monocytes Absolute: 0.9 10*3/uL (ref 0.1–1.0)
Monocytes Relative: 9 %
Neutro Abs: 7.3 10*3/uL (ref 1.7–7.7)
Neutrophils Relative %: 70 %
Platelets: 409 10*3/uL — ABNORMAL HIGH (ref 150–400)
RBC: 4.6 MIL/uL (ref 3.87–5.11)
RDW: 13.4 % (ref 11.5–15.5)
WBC: 10.4 10*3/uL (ref 4.0–10.5)
nRBC: 0 % (ref 0.0–0.2)

## 2022-06-27 LAB — COMPREHENSIVE METABOLIC PANEL
ALT: 14 U/L (ref 0–44)
AST: 20 U/L (ref 15–41)
Albumin: 3.2 g/dL — ABNORMAL LOW (ref 3.5–5.0)
Alkaline Phosphatase: 68 U/L (ref 38–126)
Anion gap: 8 (ref 5–15)
BUN: 14 mg/dL (ref 6–20)
CO2: 24 mmol/L (ref 22–32)
Calcium: 9.2 mg/dL (ref 8.9–10.3)
Chloride: 106 mmol/L (ref 98–111)
Creatinine, Ser: 0.98 mg/dL (ref 0.44–1.00)
GFR, Estimated: >60 mL/min (ref >60)
Glucose, Bld: 92 mg/dL (ref 70–99)
Potassium: 3.8 mmol/L (ref 3.5–5.1)
Sodium: 138 mmol/L (ref 135–145)
Total Bilirubin: 0.3 mg/dL (ref 0.3–1.2)
Total Protein: 6.7 g/dL (ref 6.5–8.1)

## 2022-06-27 LAB — RESP PANEL BY RT-PCR (FLU A&B, COVID) ARPGX2
Influenza A by PCR: NEGATIVE
Influenza B by PCR: NEGATIVE
SARS Coronavirus 2 by RT PCR: NEGATIVE

## 2022-06-27 MED ORDER — KETOROLAC TROMETHAMINE 15 MG/ML IJ SOLN
15.0000 mg | Freq: Once | INTRAMUSCULAR | Status: AC
  Administered 2022-06-27: 15 mg via INTRAVENOUS
  Filled 2022-06-27: qty 1

## 2022-06-27 MED ORDER — AMOXICILLIN-POT CLAVULANATE 875-125 MG PO TABS
1.0000 | ORAL_TABLET | Freq: Once | ORAL | Status: AC
  Administered 2022-06-27: 1 via ORAL
  Filled 2022-06-27: qty 1

## 2022-06-27 MED ORDER — AMOXICILLIN-POT CLAVULANATE 400-57 MG/5ML PO SUSR
875.0000 mg | Freq: Three times a day (TID) | ORAL | 0 refills | Status: AC
Start: 2022-06-27 — End: 2022-07-04

## 2022-06-27 MED ORDER — SODIUM CHLORIDE 0.9 % IV SOLN: 1.0000 g | Freq: Once | INTRAVENOUS | Status: DC

## 2022-06-27 MED ORDER — AZITHROMYCIN 250 MG PO TABS: 250.0000 mg | ORAL_TABLET | Freq: Every day | ORAL | 0 refills | Status: AC

## 2022-06-27 MED ORDER — AZITHROMYCIN 250 MG PO TABS
500.0000 mg | ORAL_TABLET | Freq: Once | ORAL | Status: AC
  Administered 2022-06-27: 500 mg via ORAL
  Filled 2022-06-27: qty 2

## 2022-06-27 MED ORDER — SODIUM CHLORIDE 0.9 % IV BOLUS
1000.0000 mL | Freq: Once | INTRAVENOUS | Status: AC
  Administered 2022-06-27: 1000 mL via INTRAVENOUS

## 2022-06-27 NOTE — ED Notes (Signed)
Staff'@bedside'$ 

## 2022-06-27 NOTE — Discharge Instructions (Signed)
Be sure to drink plenty of fluids.  You were given your first dose of antibiotics tonight, start the Augmentin and azithromycin tomorrow.  Follow up closely with your primary care physician.  If you develop high fever, coughing up blood, chest pain, new or worsening shortness of breath, or any other new/concerning symptoms then return to the ER for evaluation.

## 2022-06-27 NOTE — ED Triage Notes (Signed)
Pt states that she had surgery on her neck 2 weeks ago and since has been coughing up yellow mucus and feeling SOB. Pt also c/o of body aches and fatigue. Recent home covid test was neg

## 2022-06-27 NOTE — ED Notes (Signed)
Patient transported to X-ray 

## 2022-06-27 NOTE — ED Provider Notes (Signed)
[facility] EMERGENCY DEPARTMENT Provider Note   CSN: [id number] Arrival date & time: 06/27/22  2000     History  Chief Complaint  Patient presents with   Cough   Shortness of Breath    Emily Montoya is a 51 y.o. female.  HPI 51 year old female presents with cough and shortness of breath.  She had neck surgery by Dr. Ronnald Ramp about 2 weeks ago.  Ever since the surgery she has been having a cough.  It has yellow sputum.  No fevers but she has been having some chills and recently is developing some body aches.  Over the past several days she has been developing dyspnea.  States yesterday she had a chest x-ray in the neurosurgery office and was told that they want to get a follow-up x-ray due to some "abnormalities".  No chest pain, vomiting, abdominal pain.  She is having body aches. No neck pain or swelling.  Home Medications Prior to Admission medications   Medication Sig Start Date End Date Taking? Authorizing Provider  amoxicillin-clavulanate (AUGMENTIN) 400-57 MG/5ML suspension Take 10.9 mLs (875 mg total) by mouth 3 (three) times daily for 7 days. 06/27/22 07/04/22 Yes Sherwood Gambler, MD  azithromycin (ZITHROMAX) 250 MG tablet Take 1 tablet (250 mg total) by mouth daily for 4 days. 06/28/22 07/02/22 Yes Sherwood Gambler, MD  oxyCODONE-acetaminophen (PERCOCET/ROXICET) 5-325 MG tablet Take by mouth every 6 (six) hours as needed for severe pain.    [provider]  tamoxifen (NOLVADEX) 10 MG tablet TAKE 1 TABLET(10 MG) BY MOUTH DAILY 05/05/22   Alla Feeling, NP  tiZANidine (ZANAFLEX) 4 MG tablet Take 1 tablet (4 mg total) by mouth every 8 (eight) hours as needed for muscle spasms. 12/02/21   Mcarthur Rossetti, MD  venlafaxine XR (EFFEXOR-XR) 75 MG 24 hr capsule TAKE 1 CAPSULE BY MOUTH ONCE DAILY WITH BREAKFAST 06/27/22   Truitt Merle, MD      Allergies    Meloxicam    Review of Systems   Review of Systems  Constitutional:  Positive for chills. Negative for fever.   Respiratory:  Positive for cough and shortness of breath.   Cardiovascular:  Negative for chest pain and leg swelling.  Gastrointestinal:  Negative for abdominal pain.  Musculoskeletal:  Negative for neck pain.  Neurological:  Positive for weakness.    Physical Exam Updated Vital Signs BP 104/88   Pulse 83   Temp 98.5 F (36.9 C) (Oral)   Resp 17   Ht '5\' 3"'$  (1.6 m)   Wt 71.2 kg   LMP  (LMP Unknown) Comment: being treated for breast ca  SpO2 96%   BMI 27.81 kg/m  Physical Exam Vitals and nursing note reviewed.  Constitutional:      Appearance: She is well-developed. She is not ill-appearing or diaphoretic.  HENT:     Head: Normocephalic and atraumatic.  Neck:     Comments: Neck incision is C/D/I Cardiovascular:     Rate and Rhythm: Normal rate and regular rhythm.     Heart sounds: Normal heart sounds.  Pulmonary:     Effort: Pulmonary effort is normal. No accessory muscle usage or respiratory distress.     Breath sounds: Examination of the right-lower field reveals rales. Examination of the left-lower field reveals rales. Rales present.  Abdominal:     Palpations: Abdomen is soft.     Tenderness: There is no abdominal tenderness.  Musculoskeletal:     Right lower leg: No edema.  Left lower leg: No edema.  Skin:    General: Skin is warm and dry.  Neurological:     Mental Status: She is alert.     ED Results / Procedures / Treatments   Labs (all labs ordered are listed, but only abnormal results are displayed) Labs Reviewed  COMPREHENSIVE METABOLIC PANEL - Abnormal; Notable for the following components:      Result Value   Albumin 3.2 (*)    All other components within normal limits  CBC WITH DIFFERENTIAL/PLATELET - Abnormal; Notable for the following components:   Platelets 409 (*)    Abs Immature Granulocytes 0.13 (*)    All other components within normal limits  RESP PANEL BY RT-PCR (FLU A&B, COVID) [id number]  PREGNANCY, URINE    EKG EKG  Interpretation  Date/Time:  Friday June 27 2022 20:45:00 EDT Ventricular Rate:  74 PR Interval:  130 QRS Duration: 84 QT Interval:  375 QTC Calculation: 416 R Axis:   75 Text Interpretation: Sinus rhythm no acute ST/T changes No old tracing to compare Confirmed by Sherwood Gambler [phone]) on 06/27/2022 8:46:40 PM  Radiology DG Chest 2 View  Result Date: 06/27/2022 CLINICAL DATA:  Cough, dyspnea, pneumonia EXAM: CHEST - 2 VIEW COMPARISON:  06/26/2022 FINDINGS: Stable pulmonary insufflation. Patchy infiltrate at the left lung base is again noted, possibly infectious in the acute setting. Lungs are otherwise clear. No pneumothorax. Small left pleural effusion has developed. Cardiac size within normal limits. Pulmonary vascularity is normal. Surgical clips are seen within the right axilla. IMPRESSION: Left basilar infiltrate, possibly infectious in the acute setting. Small associated left pleural effusion. Follow-up chest radiograph in 3-4 weeks would be helpful to document resolution. Electronically Signed   By: Fidela Salisbury M.D.   On: 06/27/2022 21:24    Procedures Procedures    Medications Ordered in ED Medications  sodium chloride 0.9 % bolus 1,000 mL (0 mLs Intravenous Stopped 06/27/22 2155)  ketorolac (TORADOL) 15 MG/ML injection 15 mg (15 mg Intravenous Given 06/27/22 2146)  amoxicillin-clavulanate (AUGMENTIN) 875-125 MG per tablet 1 tablet (1 tablet Oral Given 06/27/22 2149)  azithromycin (ZITHROMAX) tablet 500 mg (500 mg Oral Given 06/27/22 2148)    ED Course/ Medical Decision Making/ A&P                           Medical Decision Making Amount and/or Complexity of Data Reviewed Labs: ordered.    Details: High normal WBC.  Metabolic panel unremarkable.  COVID/flu test negative. Radiology: ordered and independent interpretation performed.    Details: Chest x-ray with pneumonia. ECG/medicine tests: ordered and independent interpretation performed.    Details: No acute  ischemia.  Risk Prescription drug management.   Patient appears to have pneumonia postoperatively.  She is clearly not feeling well but is not ill-appearing.  She has soft blood pressures but chart review shows she had runs normal to soft low.  She was given IV fluids.  Labs are reassuring.  She is afebrile.  She is not hypoxic.  At this point, I think she can be treated as an outpatient.  Since she has postoperative I did consider PE but with the productive cough, chills, body aches, x-ray findings, this seems to be a pneumonia.  I do not think CTA or PE work-up is warranted.  We will treat with antibiotics and supportive care.  She has a hard time swallowing the Augmentin and at first could not swallow it (a chronic problem,  not related to her neck surgery) but eventually was able to swallow.  I ordered Rocephin due to the difficulty swallowing but then since she was able to swallow we will cancel this.  She and family were advised of return precautions but appears stable for discharge home.        Final Clinical Impression(s) / ED Diagnoses Final diagnoses:  Community acquired pneumonia of left lower lobe of lung    Rx / DC Orders ED Discharge Orders          Ordered    azithromycin (ZITHROMAX) 250 MG tablet  Daily        06/27/22 2223    amoxicillin-clavulanate (AUGMENTIN) 400-57 MG/5ML suspension  3 times daily        06/27/22 2223              Sherwood Gambler, MD 06/27/22 2238

## 2022-07-01 ENCOUNTER — Other Ambulatory Visit: Payer: BC Managed Care – PPO

## 2022-07-03 ENCOUNTER — Encounter: Payer: Self-pay | Admitting: Nurse Practitioner

## 2022-07-04 ENCOUNTER — Ambulatory Visit (HOSPITAL_BASED_OUTPATIENT_CLINIC_OR_DEPARTMENT_OTHER): Payer: BC Managed Care – PPO | Admitting: Orthopaedic Surgery

## 2022-07-10 ENCOUNTER — Ambulatory Visit (INDEPENDENT_AMBULATORY_CARE_PROVIDER_SITE_OTHER): Payer: BC Managed Care – PPO | Admitting: Orthopaedic Surgery

## 2022-07-10 DIAGNOSIS — M25511 Pain in right shoulder: Secondary | ICD-10-CM | POA: Diagnosis not present

## 2022-07-10 DIAGNOSIS — M25512 Pain in left shoulder: Secondary | ICD-10-CM

## 2022-07-10 DIAGNOSIS — G8929 Other chronic pain: Secondary | ICD-10-CM | POA: Diagnosis not present

## 2022-07-10 MED ORDER — TRIAMCINOLONE ACETONIDE 40 MG/ML IJ SUSP
80.0000 mg | INTRAMUSCULAR | Status: AC | PRN
  Administered 2022-07-10: 80 mg via INTRA_ARTICULAR

## 2022-07-10 MED ORDER — LIDOCAINE HCL 1 % IJ SOLN
4.0000 mL | INTRAMUSCULAR | Status: AC | PRN
  Administered 2022-07-10: 4 mL

## 2022-07-10 NOTE — Progress Notes (Signed)
Chief Complaint: Right shoulder pain     History of Present Illness:   07/10/2022: Presents today for follow-up of her right shoulder.  Unfortunately she did get significant relief from her last injection although this is somewhat worn off after recent cervical fusion.  She remains having bilateral upper extremity pain with overhead activity.  The right has been significantly bothersome to her.  Emily Montoya is a 51 y.o. female right female presents with right shoulder pain that is been ongoing now for several months.  She is here today as a referral from partner Dr. Ninfa Linden.  He has previously performed a subacromial injection although he has subsequently referred her for glenohumeral type injection.  She has very limited range of motion following a lymph node dissection on the right side in December 2022.  She endorses quite limited motion as well as shoulder pain and difficulty lying directly on the side.    Surgical History:   None  PMH/PSH/Family History/Social History/Meds/Allergies:    Past Medical History:  Diagnosis Date  . Breast cancer ([place]) 08/20/2021  . Sickle cell trait [facility])    Past Surgical History:  Procedure Laterality Date  . BREAST LUMPECTOMY WITH RADIOACTIVE SEED AND SENTINEL LYMPH NODE BIOPSY Right 10/11/2021   Procedure: RIGHT BREAST LUMPECTOMY WITH RADIOACTIVE SEED LOCALIZATION AND SENTINEL LYMPH NODE BIOPSY;  Surgeon: [name], MD;  Location: [place];  Service: General;  Laterality: Right;  . NECK SURGERY    . TUBAL LIGATION     Social History   Socioeconomic History  . Marital status: Legally Separated    Spouse name: Not on file  . Number of children: 3  . Years of education: Not on file  . Highest education level: Not on file  Occupational History  . Not on file  Tobacco Use  . Smoking status: Former    Packs/day: 0.25    Years: 12.00    Total pack years: 3.00    Types:  Cigarettes    Quit date: 08/22/2021    Years since quitting: 0.8  . Smokeless tobacco: Never  Vaping Use  . Vaping Use: Never used  Substance and Sexual Activity  . Alcohol use: Yes    Comment: Occasional Beers  . Drug use: Never  . Sexual activity: Not on file    Comment: s/p tubal ligation  Other Topics Concern  . Not on file  Social History Narrative  . Not on file   Social Determinants of Health   Financial Resource Strain: Not on file  Food Insecurity: No Food Insecurity (03/13/2022)   Hunger Vital Sign   . Worried About Charity fundraiser in the Last Year: Never true   . Ran Out of Food in the Last Year: Never true  Transportation Needs: No Transportation Needs (03/13/2022)   PRAPARE - Transportation   . Lack of Transportation (Medical): No   . Lack of Transportation (Non-Medical): No  Physical Activity: Unknown (03/13/2022)   Exercise Vital Sign   . Days of Exercise per Week: Not on file   . Minutes of Exercise per Session: 0 min  Stress: Not on file  Social Connections: Socially Isolated (03/13/2022)   Social Connection and Isolation Panel [NHANES]   . Frequency of Communication with Friends and Family: Once a week   .  Frequency of Social Gatherings with Friends and Family: Once a week   . Attends Religious Services: Never   . Active Member of Clubs or Organizations: No   . Attends Archivist Meetings: Never   . Marital Status: Separated   Family History  Problem Relation Age of Onset  . HIV/AIDS Mother   . Cancer Maternal Grandmother        Throat   Allergies  Allergen Reactions  . Meloxicam Swelling   Current Outpatient Medications  Medication Sig Dispense Refill  . oxyCODONE-acetaminophen (PERCOCET/ROXICET) 5-325 MG tablet Take by mouth every 6 (six) hours as needed for severe pain.    . tamoxifen (NOLVADEX) 10 MG tablet TAKE 1 TABLET(10 MG) BY MOUTH DAILY 30 tablet 3  . tiZANidine (ZANAFLEX) 4 MG tablet Take 1 tablet (4 mg total) by mouth  every 8 (eight) hours as needed for muscle spasms. 40 tablet 1  . venlafaxine XR (EFFEXOR-XR) 75 MG 24 hr capsule TAKE 1 CAPSULE BY MOUTH ONCE DAILY WITH BREAKFAST 30 capsule 0   No current facility-administered medications for this visit.   No results found.  Review of Systems:   A ROS was performed including pertinent positives and negatives as documented in the HPI.  Physical Exam :   Constitutional: NAD and appears stated age Neurological: Alert and oriented Psych: Appropriate affect and cooperative There were no vitals taken for this visit.   Comprehensive Musculoskeletal Exam:    Musculoskeletal Exam    Inspection Right Left  Skin No atrophy or winging No atrophy or winging  Palpation    Tenderness GH Glenohumeral  Range of Motion    Flexion (passive) 100 170  Flexion (active) 45 with pain 170  Abduction 40 with pain 170  ER at the side 20 70  Can reach behind back to Back pocket T12  Strength     Not tested Full  Special Tests    Pseudoparalytic No No  Neurologic    Fires PIN, radial, median, ulnar, musculocutaneous, axillary, suprascapular, long thoracic, and spinal accessory innervated muscles. No abnormal sensibility  Vascular/Lymphatic    Radial Pulse 2+ 2+  Cervical Exam    Patient has symmetric cervical range of motion with negative Spurling's test.  Special Test:      Imaging:   Xray (3 views right shoulder, left shoulder 3 views): Normal   I personally reviewed and interpreted the radiographs.   Assessment:   51 y.o. female right-hand-dominant presents with right shoulder pain consistent with adhesive capsulitis.  At this time she is pending MRIs as she is not having any relief from injections and therapy.  I would like her to reenroll in physical therapy at this time now that she is feeling better from her cervical procedure.  At today's visit she is elected for right shoulder ultrasound-guided injection Plan :    -Plan for right shoulder  ultrasound-guided injection today    Procedure Note  Patient: Emily Montoya             Date of Birth: 09/23/1971           MRN: [id number]             Visit Date: 07/10/2022  Procedures: Visit Diagnoses: No diagnosis found.  Large Joint Inj: R glenohumeral on 07/10/2022 10:52 AM Indications: pain Details: 22 G 1.5 in needle, ultrasound-guided anterior approach  Arthrogram: No  Medications: 4 mL lidocaine 1 %; 80 mg triamcinolone acetonide 40 MG/ML Outcome: tolerated well, no immediate  complications Procedure, treatment alternatives, risks and benefits explained, specific risks discussed. Consent was given by the patient. Immediately prior to procedure a time out was called to verify the correct patient, procedure, equipment, support staff and site/side marked as required. Patient was prepped and draped in the usual sterile fashion.         I personally saw and evaluated the patient, and participated in the management and treatment plan.  Vanetta Mulders, MD Attending Physician, Orthopedic Surgery  This document was dictated using Dragon voice recognition software. A reasonable attempt at proof reading has been made to minimize errors.

## 2022-07-11 ENCOUNTER — Ambulatory Visit: Payer: BC Managed Care – PPO | Admitting: Hematology

## 2022-07-11 ENCOUNTER — Other Ambulatory Visit: Payer: BC Managed Care – PPO

## 2022-07-23 ENCOUNTER — Ambulatory Visit (INDEPENDENT_AMBULATORY_CARE_PROVIDER_SITE_OTHER): Payer: BC Managed Care – PPO | Admitting: Psychologist

## 2022-07-23 DIAGNOSIS — F4521 Hypochondriasis: Secondary | ICD-10-CM

## 2022-07-23 NOTE — Progress Notes (Signed)
[place] Counselor/Therapist Progress Note  Patient ID: Emily Montoya, MRN: [id number],    Date: 07/23/2022  Time Spent: 11:02 am to 11:48 am; total time: 46 minutes   This session was held via video webex teletherapy due to the coronavirus risk at this time. The patient consented to video teletherapy and was located at her home during this session. She is aware it is the responsibility of the patient to secure confidentiality on her end of the session. The provider was in a private home office for the duration of this session. Limits of confidentiality were discussed with the patient.   Treatment Type: Individual Therapy  Reported Symptoms: Stress due to financial concerns and concerns over applying for disability  Mental Status Exam: Appearance:  Well Groomed     Behavior: Appropriate  Motor: Normal  Speech/Language:  Clear and Coherent  Affect: Appropriate  Mood: normal  Thought process: normal  Thought content:   WNL  Sensory/Perceptual disturbances:   WNL  Orientation: oriented to person, place, and time/date  Attention: Good  Concentration: Good  Memory: WNL  Fund of knowledge:  Good  Insight:   Good  Judgment:  Good  Impulse Control: Good   Risk Assessment: Danger to Self:  No Self-injurious Behavior: No Danger to Others: No Duty to Warn:no Physical Aggression / Violence:No  Access to Firearms a concern: No  Gang Involvement:No   Subjective: Beginning the session patient described herself as doing poorly. Elaborating, she disclosed that she was evicted and that she is feeling overwhelmed trying to apply for disability. She spent the session reflecting on the different stressors and indicated that it is difficult to ask for assistance from others. She processed thoughts and emotions. She was agreeable to continuing to use coping skills as they have helped. She denied suicidal and homicidal ideation.    Interventions:  Worked on developing a  therapeutic relationship with the patient using active listening and reflective statements. Provided emotional support using empathy and validation. Reviewed the treatment plan with the patient. Used summary and reflective statements. Validated patient's experience. Normalized the frustrations that patient expressed related to limitations in the health care system. Explored if the patient was working with a Education officer, museum who could assist the patient. Processed the emotions. Explored the hierarchy of needs for the patient. Reviewed coping skills. Provided empathic statements. Assessed for suicidal and homicidal ideation.   Homework: Continue with coping skills and try to get connected with social work  Next Session: Emotional support.   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  Plan:   Goals Work through the grieving process and face reality of own death Accept emotional support from others around them Live life to the fullest, event though time may be limited Become as knowledgeable about the medical condition  Reduce fear, anxiety about the health condition  Accept the illness Accept the role of psychological and behavioral factors  Stabilize anxiety level wile increasing ability to function Learn and implement coping skills that result in a reduction of anxiety  Alleviate depressive symptoms Recognize, accept, and cope with depressive feelings Develop healthy thinking patterns Develop healthy interpersonal relationships  Objectives target date for all objectives is 04/26/2023 Identify feelings associated with the illness Family members share with each other feelings Identify the losses or limitations that have been experienced Verbalize acceptance of the reality of the medical condition Commit to learning and implement a proactive approach to managing personal stresses Verbalize an understanding of the medical condition Work with therapist to  develop a plan for coping with  stress Learn and implement skills for managing stress Engage in social, productive activities that are possible Engage in faith based activities implement positive imagery Identify coping skills and sources of emotional support Patient's partner and family members verbalize their fears regarding severity of health condition Identify sources of emotional distress  Learning and implement calming skills to reduce overall anxiety Learn and implement problem solving strategies Identify and engage in pleasant activities Learning and implement personal and interpersonal skills to reduce anxiety and improve interpersonal relationships Learn to accept limitations in life and commit to tolerating, rather than avoiding, unpleasant emotions while accomplishing meaningful goals Identify major life conflicts from the past and present that form the basis for present anxiety Learn and implement behavioral strategies Verbalize an understanding and resolution of current interpersonal problems Learn and implement problem solving and decision making skills Learn and implement conflict resolution skills to resolve interpersonal problems Verbalize an understanding of healthy and unhealthy emotions verbalize insight into how past relationships may be influence current experiences with depression Use mindfulness and acceptance strategies and increase value based behavior  Increase hopeful statements about the future.   Interventions Teach about stress and ways to handle stress Assist the patient in developing a coping action plan for stressors Conduct skills based training for coping strategies Train problem focused skills Sort out what activities the individual can do Encourage patient to rely upon his/her spiritual faith Teach the patient to use guided imagery Probe and evaluate family's ability to provide emotional support Allow family to share their fears Assist the patient in identifying, sorting  through, and verbalizing the various feelings generated by his/her medical condition Meet with family members  Ask patient list out limitations  Use stress inoculation training  Use Acceptance and Commitment Therapy to help client accept uncomfortable realities in order to accomplish value-consistent goals Reinforce the client's insight into the role of his/her past emotional pain and present anxiety  Discuss examples demonstrating that unrealistic worry overestimates the probability of threats and underestimate patient's ability  Assist the patient in analyzing his or her worries Help patient understand that avoidance is reinforcing  Behavioral activation help the client explore the relationship, nature of the dispute,  Help the client develop new interpersonal skills and relationships Conduct Problem so living therapy Teach conflict resolution skills Use a process-experiential approach Conduct TLDP Conduct ACT  The patient and clinician reviewed the treatment plan on 05/20/2022. The patient approved of the treatment plan.   Conception Chancy, PsyD

## 2022-07-24 DIAGNOSIS — M5412 Radiculopathy, cervical region: Secondary | ICD-10-CM | POA: Diagnosis not present

## 2022-07-27 ENCOUNTER — Other Ambulatory Visit: Payer: Self-pay | Admitting: Hematology

## 2022-08-04 ENCOUNTER — Ambulatory Visit: Payer: BC Managed Care – PPO | Attending: Hematology

## 2022-08-04 VITALS — Wt 158.4 lb

## 2022-08-04 DIAGNOSIS — Z483 Aftercare following surgery for neoplasm: Secondary | ICD-10-CM | POA: Insufficient documentation

## 2022-08-04 NOTE — Therapy (Signed)
  OUTPATIENT PHYSICAL THERAPY SOZO SCREENING NOTE   Patient Name: Emily Montoya MRN: [id number] DOB:18-May-1971, 51 y.o., female Today's Date: 08/04/2022  PCP: Wenda Low, MD REFERRING PROVIDER: Truitt Merle, MD   PT End of Session - 08/04/22 [phone]     Visit Number 4   # unchanged due to screen only   PT Start Time 0954    PT Stop Time 0959    PT Time Calculation (min) 5 min    Activity Tolerance Patient tolerated treatment well    Behavior During Therapy [facility] for tasks assessed/performed             Past Medical History:  Diagnosis Date   Breast cancer ([place]) 08/20/2021   Sickle cell trait ([place])    Past Surgical History:  Procedure Laterality Date   BREAST LUMPECTOMY WITH RADIOACTIVE SEED AND SENTINEL LYMPH NODE BIOPSY Right 10/11/2021   Procedure: RIGHT BREAST LUMPECTOMY WITH RADIOACTIVE SEED LOCALIZATION AND SENTINEL LYMPH NODE BIOPSY;  Surgeon: [name], MD;  Location: [place];  Service: General;  Laterality: Right;   NECK SURGERY     TUBAL LIGATION     Patient Active Problem List   Diagnosis Date Noted   Genetic testing 09/23/2021   Malignant neoplasm of upper-inner quadrant of right breast in female, estrogen receptor positive ([place]) 08/29/2021    REFERRING DIAG: right breast cancer at risk for lymphedema  THERAPY DIAG:  Aftercare following surgery for neoplasm  PERTINENT HISTORY: R breast cancer ER+PR+, R breast lumpectomy and SLNB (0/7) on 10/11/21   PRECAUTIONS: right UE Lymphedema risk, None  SUBJECTIVE: Pt returns for her 3 month L-Dex screen.   PAIN:  Are you having pain? No   SOZO SCREENING: Patient was assessed today using the SOZO machine to determine the lymphedema index score. This was compared to her baseline score. It was determined that she is within the recommended range when compared to her baseline and no further action is needed at this time. She will continue SOZO screenings. These are done every 3 months for 2  years post operatively followed by every 6 months for 2 years, and then annually.   L-DEX FLOWSHEETS - 08/04/22 0900       L-DEX LYMPHEDEMA SCREENING   Measurement Type Unilateral    L-DEX MEASUREMENT EXTREMITY Upper Extremity    POSITION  Standing    DOMINANT SIDE Right    At Risk Side Right    BASELINE SCORE (UNILATERAL) -0.3    L-DEX SCORE (UNILATERAL) 3.7    VALUE CHANGE (UNILAT) 4              Otelia Limes, PTA 08/04/2022, 9:58 AM

## 2022-08-06 ENCOUNTER — Telehealth: Payer: Self-pay | Admitting: Orthopaedic Surgery

## 2022-08-06 ENCOUNTER — Ambulatory Visit (INDEPENDENT_AMBULATORY_CARE_PROVIDER_SITE_OTHER): Payer: BC Managed Care – PPO | Admitting: Psychologist

## 2022-08-06 DIAGNOSIS — F4521 Hypochondriasis: Secondary | ICD-10-CM | POA: Diagnosis not present

## 2022-08-06 NOTE — Progress Notes (Signed)
[place] Counselor/Therapist Progress Note  Patient ID: Emily Montoya, MRN: [id number],    Date: 08/06/2022  Time Spent: 11:02 am to 11:40 am; total time: 38 minutes   This session was held via video webex teletherapy due to the coronavirus risk at this time. The patient consented to video teletherapy and was located at her home during this session. She is aware it is the responsibility of the patient to secure confidentiality on her end of the session. The provider was in a private home office for the duration of this session. Limits of confidentiality were discussed with the patient.   Treatment Type: Individual Therapy  Reported Symptoms: Anxiety being reinforced by avoidance  Mental Status Exam: Appearance:  Well Groomed     Behavior: Appropriate  Motor: Normal  Speech/Language:  Clear and Coherent  Affect: Appropriate  Mood: normal  Thought process: normal  Thought content:   WNL  Sensory/Perceptual disturbances:   WNL  Orientation: oriented to person, place, and time/date  Attention: Good  Concentration: Good  Memory: WNL  Fund of knowledge:  Good  Insight:   Good  Judgment:  Good  Impulse Control: Good   Risk Assessment: Danger to Self:  No Self-injurious Behavior: No Danger to Others: No Duty to Warn:no Physical Aggression / Violence:No  Access to Firearms a concern: No  Gang Involvement:No   Subjective: Beginning the session patient described herself as doing poorly and that she is taking it out on those around her. Per the patient, she voiced that she has not been practicing strategies previously identified as helpful. She also talked about how she is avoiding interacting with others and avoiding addressing potential medical concerns due to her anxiety levels. She processed thoughts and emotions. She was agreeable to the plan discussed with the provider. She stated that she would practice coping strategies more consistently. She agreed to  follow up in person. She denied suicidal and homicidal ideation.    Interventions:  Worked on developing a therapeutic relationship with the patient using active listening and reflective statements. Provided emotional support using empathy and validation. Reviewed the treatment plan with the patient. Reviewed events since the last session. Normalized and validated expressed thoughts and emotions. Processed events that have occurred. Identified goals for the session. Explored whether or not patient has been participating in coping strategies. Identified barriers and explored ways to overcome those barriers. Provided psychoeducation about anxiety and what reinforces anxiety, which is avoidance. Provided psychoeducation about the treatment for anxiety is to do what contributes to anxiety. Processed thoughts and emotions. Used socratic questions to assist the patient. Discussed meeting in person as meeting virtually is possibly another way of avoidance. Provided empathic statements. Assessed for suicidal and homicidal ideation.   Homework: Coping skills  Next Session: Review coping skills and process emotions surrounding anxiety. Emotional support.   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  Plan:   Goals Work through the grieving process and face reality of own death Accept emotional support from others around them Live life to the fullest, event though time may be limited Become as knowledgeable about the medical condition  Reduce fear, anxiety about the health condition  Accept the illness Accept the role of psychological and behavioral factors  Stabilize anxiety level wile increasing ability to function Learn and implement coping skills that result in a reduction of anxiety  Alleviate depressive symptoms Recognize, accept, and cope with depressive feelings Develop healthy thinking patterns Develop healthy interpersonal relationships  Objectives target date for all  objectives is  04/26/2023 Identify feelings associated with the illness Family members share with each other feelings Identify the losses or limitations that have been experienced Verbalize acceptance of the reality of the medical condition Commit to learning and implement a proactive approach to managing personal stresses Verbalize an understanding of the medical condition Work with therapist to develop a plan for coping with stress Learn and implement skills for managing stress Engage in social, productive activities that are possible Engage in faith based activities implement positive imagery Identify coping skills and sources of emotional support Patient's partner and family members verbalize their fears regarding severity of health condition Identify sources of emotional distress  Learning and implement calming skills to reduce overall anxiety Learn and implement problem solving strategies Identify and engage in pleasant activities Learning and implement personal and interpersonal skills to reduce anxiety and improve interpersonal relationships Learn to accept limitations in life and commit to tolerating, rather than avoiding, unpleasant emotions while accomplishing meaningful goals Identify major life conflicts from the past and present that form the basis for present anxiety Learn and implement behavioral strategies Verbalize an understanding and resolution of current interpersonal problems Learn and implement problem solving and decision making skills Learn and implement conflict resolution skills to resolve interpersonal problems Verbalize an understanding of healthy and unhealthy emotions verbalize insight into how past relationships may be influence current experiences with depression Use mindfulness and acceptance strategies and increase value based behavior  Increase hopeful statements about the future.   Interventions Teach about stress and ways to handle stress Assist the patient in  developing a coping action plan for stressors Conduct skills based training for coping strategies Train problem focused skills Sort out what activities the individual can do Encourage patient to rely upon his/her spiritual faith Teach the patient to use guided imagery Probe and evaluate family's ability to provide emotional support Allow family to share their fears Assist the patient in identifying, sorting through, and verbalizing the various feelings generated by his/her medical condition Meet with family members  Ask patient list out limitations  Use stress inoculation training  Use Acceptance and Commitment Therapy to help client accept uncomfortable realities in order to accomplish value-consistent goals Reinforce the client's insight into the role of his/her past emotional pain and present anxiety  Discuss examples demonstrating that unrealistic worry overestimates the probability of threats and underestimate patient's ability  Assist the patient in analyzing his or her worries Help patient understand that avoidance is reinforcing  Behavioral activation help the client explore the relationship, nature of the dispute,  Help the client develop new interpersonal skills and relationships Conduct Problem so living therapy Teach conflict resolution skills Use a process-experiential approach Conduct TLDP Conduct ACT  The patient and clinician reviewed the treatment plan on 05/20/2022. The patient approved of the treatment plan.   Conception Chancy, PsyD

## 2022-08-06 NOTE — Telephone Encounter (Signed)
Pt called requesting a call back concerning MRI if medicaid will cover. Please call pt at [phone].

## 2022-08-08 ENCOUNTER — Encounter: Payer: Self-pay | Admitting: Hematology

## 2022-08-08 ENCOUNTER — Inpatient Hospital Stay: Payer: BC Managed Care – PPO | Attending: Nurse Practitioner

## 2022-08-08 ENCOUNTER — Inpatient Hospital Stay (HOSPITAL_BASED_OUTPATIENT_CLINIC_OR_DEPARTMENT_OTHER): Payer: BC Managed Care – PPO | Admitting: Hematology

## 2022-08-08 ENCOUNTER — Other Ambulatory Visit: Payer: Self-pay

## 2022-08-08 VITALS — BP 124/71 | HR 91 | Temp 98.1°F | Resp 15 | Wt 158.2 lb

## 2022-08-08 DIAGNOSIS — Z170 Estrogen receptor positive status [ER+]: Secondary | ICD-10-CM

## 2022-08-08 DIAGNOSIS — Z79810 Long term (current) use of selective estrogen receptor modulators (SERMs): Secondary | ICD-10-CM | POA: Diagnosis not present

## 2022-08-08 DIAGNOSIS — Z79899 Other long term (current) drug therapy: Secondary | ICD-10-CM | POA: Insufficient documentation

## 2022-08-08 DIAGNOSIS — C50211 Malignant neoplasm of upper-inner quadrant of right female breast: Secondary | ICD-10-CM

## 2022-08-08 DIAGNOSIS — D573 Sickle-cell trait: Secondary | ICD-10-CM | POA: Diagnosis not present

## 2022-08-08 LAB — COMPREHENSIVE METABOLIC PANEL
ALT: 19 U/L (ref 0–44)
AST: 18 U/L (ref 15–41)
Albumin: 3.9 g/dL (ref 3.5–5.0)
Alkaline Phosphatase: 69 U/L (ref 38–126)
Anion gap: 4 — ABNORMAL LOW (ref 5–15)
BUN: 10 mg/dL (ref 6–20)
CO2: 30 mmol/L (ref 22–32)
Calcium: 9.2 mg/dL (ref 8.9–10.3)
Chloride: 107 mmol/L (ref 98–111)
Creatinine, Ser: 0.86 mg/dL (ref 0.44–1.00)
GFR, Estimated: >60 mL/min (ref >60)
Glucose, Bld: 81 mg/dL (ref 70–99)
Potassium: 3.6 mmol/L (ref 3.5–5.1)
Sodium: 141 mmol/L (ref 135–145)
Total Bilirubin: 0.4 mg/dL (ref 0.3–1.2)
Total Protein: 7.1 g/dL (ref 6.5–8.1)

## 2022-08-08 LAB — CBC WITH DIFFERENTIAL/PLATELET
Abs Immature Granulocytes: 0.04 10*3/uL (ref 0.00–0.07)
Basophils Absolute: 0 10*3/uL (ref 0.0–0.1)
Basophils Relative: 0 %
Eosinophils Absolute: 0.1 10*3/uL (ref 0.0–0.5)
Eosinophils Relative: 1 %
HCT: 40.1 % (ref 36.0–46.0)
Hemoglobin: 12.8 g/dL (ref 12.0–15.0)
Immature Granulocytes: 1 %
Lymphocytes Relative: 25 %
Lymphs Abs: 1.7 10*3/uL (ref 0.7–4.0)
MCH: 26.5 pg (ref 26.0–34.0)
MCHC: 31.9 g/dL (ref 30.0–36.0)
MCV: 83 fL (ref 80.0–100.0)
Monocytes Absolute: 0.7 10*3/uL (ref 0.1–1.0)
Monocytes Relative: 10 %
Neutro Abs: 4.3 10*3/uL (ref 1.7–7.7)
Neutrophils Relative %: 63 %
Platelets: 328 10*3/uL (ref 150–400)
RBC: 4.83 MIL/uL (ref 3.87–5.11)
RDW: 14 % (ref 11.5–15.5)
WBC: 6.9 10*3/uL (ref 4.0–10.5)
nRBC: 0 % (ref 0.0–0.2)

## 2022-08-08 MED ORDER — TAMOXIFEN CITRATE 20 MG PO TABS: 20.0000 mg | ORAL_TABLET | Freq: Every day | ORAL | 1 refills | Status: DC

## 2022-08-08 NOTE — Progress Notes (Signed)
[place]   Telephone:([phone]) [phone] [phone]   Clinic Follow up Note   Patient Care Team: Wenda Low, MD as PCP - General (Internal Medicine) [name], MD as Consulting Physician (General Surgery) Truitt Merle, MD as Consulting Physician (Hematology) Rockwell Germany, RN as Oncology Nurse Navigator Mauro Kaufmann, RN as Oncology Nurse Navigator Alla Feeling, NP as Nurse Practitioner (Nurse Practitioner)  Date of Service:  08/08/2022  CHIEF COMPLAINT: f/u of right breast cancer  CURRENT THERAPY:  Tamoxifen, intermittently since 08/2021, currently 23m with Effexor  ASSESSMENT & PLAN:  Emily TILSONis a 51y.o. pre/peri?-menopausal female with   1. Malignant neoplasm of upper-inner quadrant of right breast, Stage IA, p(T1c, N0), ER+/PR+/HER2-, Grade 2  -presented with palpable right breast lump x6 months. She was started tamoxifen neoadjuvantly on 09/09/21 due to heavy menses. S/p right lumpectomy on 10/11/21 by Dr. TMarlou Starksshowed 1.4 cm invasive and in situ ductal carcinoma. Margins and lymph nodes negative. -oncotype RS of 16, low risk. -She resumed tamoxifen in early 10/2021, held 02/2022 for depression/mood swings. Effexor started and depression improved. Resumed low dose tamoxifen 10 mg 05/05/22, tolerating better. She increased back to 237mdue to heavy periods, and she explains the tamoxifen/effexor combination is working well for her. -she developed new palpable right breast lump and tenderness. MM and [place]23 showed a seroma. This improved on its own. -aside from a number of unrelated issues, such as neck surgery then postoperative pneumonia, she is clinically doing well from a breast cancer standpoint. -b/l mammo due later this month, order in place. She agrees to call and schedule.  2. Right frozen shoulder -she has pain with lifting her right arm above maybe 30 degrees. X-ray on 06/06/22 showed mild osteoarthritis. This was likely  worsened with breast and recent neck surgeries. -I recommend she return to physical therapy, which she paused to have neck surgery. I encouraged her to call them to resume.     PLAN: -continue tamoxifen 50maily and effexor -I encouraged her to resume PT, she will call to schedule  -mammogram due later this month; order already in place -lab and f/u in 6 months   No problem-specific Assessment & Plan notes found for this encounter.   SUMMARY OF ONCOLOGIC HISTORY: Oncology History Overview Note   Cancer Staging  Malignant neoplasm of upper-inner quadrant of right breast in female, estrogen receptor positive ([place]CC[place] Valleytaging form: Breast, AJCC 8th Edition - Clinical stage from 08/20/2021: Stage IA (cT1c, cN0, cM0, G2, ER+, PR+, HER2-) - Signed by FenTruitt MerleD on 09/02/2021 Method of lymph node assessment: Clinical Histologic grading system: 3 grade system - Pathologic stage from 10/11/2021: Stage IA (pT1c, pN0, cM0, G2, ER+, PR+, HER2-, Oncotype DX score: 16) - Signed by FenTruitt MerleD on 01/08/2022 Stage prefix: Initial diagnosis Multigene prognostic tests performed: Oncotype DX Recurrence score range: Greater than or equal to 11 Histologic grading system: 3 grade system     Malignant neoplasm of upper-inner quadrant of right breast in female, estrogen receptor positive (HCCCoon Rapids10/19/2022 Mammogram   EXAM: DIGITAL DIAGNOSTIC BILATERAL MAMMOGRAM WITH TOMOSYNTHESIS AND CAD; ULTRASOUND RIGHT BREAST LIMITED  IMPRESSION: 1. Suspicious right breast mass at the 1 o'clock position 15 cm from the nipple on the right. It measures 1.1 x 1.3 x 0.9 cm. Recommendation is for ultrasound-guided biopsy. 2. No suspicious right axillary lymphadenopathy. 3. No mammographic evidence of malignancy on the left.   08/20/2021 Pathology Results  Diagnosis Breast, right, needle core biopsy, 1 o'clock, 15 cmfn, ribbon clip - INVASIVE DUCTAL CARCINOMA - SEE COMMENT  Microscopic Comment Based on the  biopsy, the carcinoma appears Nottingham grade 2 of 3 and measures 1.2 cm in greatest linear extent.  PROGNOSTIC INDICATORS Results: The tumor cells are NEGATIVE for Her2 (1+). Estrogen Receptor: 90%, POSITIVE, MODERATE STAINNG INTENSITY Progesterone Receptor: 90%, POSITIVE, STRONG STAINING INTENSITY Proliferation Marker Ki67: 15%   08/20/2021 Cancer Staging   Staging form: Breast, AJCC 8th Edition - Clinical stage from 08/20/2021: Stage IA (cT1c, cN0, cM0, G2, ER+, PR+, HER2-) - Signed by Truitt Merle, MD on 09/02/2021 Method of lymph node assessment: Clinical Histologic grading system: 3 grade system   08/29/2021 Initial Diagnosis   Malignant neoplasm of upper-inner quadrant of right breast in female, estrogen receptor positive ([place])   09/22/2021 Genetic Testing   Negative genetic testing on the CancerNext-Expanded+RNAinsight panel.  The report date is September 20, 2021.  The CancerNext-Expanded gene panel offered by [facility] and includes sequencing and rearrangement analysis for the following 77 genes: AIP, ALK, APC*, ATM*, AXIN2, BAP1, BARD1, BLM, BMPR1A, BRCA1*, BRCA2*, BRIP1*, CDC73, CDH1*, CDK4, CDKN1B, CDKN2A, CHEK2*, CTNNA1, DICER1, FANCC, FH, FLCN, GALNT12, KIF1B, LZTR1, MAX, MEN1, MET, MLH1*, MSH2*, MSH3, MSH6*, MUTYH*, NBN, NF1*, NF2, NTHL1, PALB2*, PHOX2B, PMS2*, POT1, PRKAR1A, PTCH1, PTEN*, RAD51C*, RAD51D*, RB1, RECQL, RET, SDHA, SDHAF2, SDHB, SDHC, SDHD, SMAD4, SMARCA4, SMARCB1, SMARCE1, STK11, SUFU, TMEM127, TP53*, TSC1, TSC2, VHL and XRCC2 (sequencing and deletion/duplication); EGFR, EGLN1, HOXB13, KIT, MITF, PDGFRA, POLD1, and POLE (sequencing only); EPCAM and GREM1 (deletion/duplication only). DNA and RNA analyses performed for * genes.   10/11/2021 Cancer Staging   Staging form: Breast, AJCC 8th Edition - Pathologic stage from 10/11/2021: Stage IA (pT1c, pN0, cM0, G2, ER+, PR+, HER2-, Oncotype DX score: 16) - Signed by Truitt Merle, MD on 01/08/2022 Stage prefix: Initial  diagnosis Multigene prognostic tests performed: Oncotype DX Recurrence score range: Greater than or equal to 11 Histologic grading system: 3 grade system   10/11/2021 Definitive Surgery   FINAL MICROSCOPIC DIAGNOSIS:   A. LYMPH NODE, RIGHT AXILLARY #1, SENTINEL, EXCISION:  - One lymph node negative for metastatic carcinoma (0/1).   B. LYMPH NODE, RIGHT AXILLARY, SENTINEL, EXCISION:  - One lymph node negative for metastatic carcinoma (0/1).   C. LYMPH NODE, RIGHT AXILLARY, SENTINEL, EXCISION:  - One lymph node negative for metastatic carcinoma (0/1).   D. LYMPH NODE, RIGHT AXILLARY #2, SENTINEL, EXCISION:  - One lymph node negative for metastatic carcinoma (0/1).   E. LYMPH NODE, RIGHT AXILLARY, SENTINEL, EXCISION:  - One lymph node negative for metastatic carcinoma (0/1).   F. BREAST, RIGHT, LUMPECTOMY:  - Invasive and in situ ductal carcinoma, 1.4 cm.  - Margins negative for carcinoma.  - Biopsy site and biopsy clip.  - See oncology table.    10/11/2021 Oncotype testing   Oncotype DX was obtained on the final surgical sample and the recurrence score of 16 predicts a risk of recurrence outside the breast over the next 9 years of 4%, if the patient's only systemic therapy is an antiestrogen for 5 years.  It also predicts no significant benefit from chemotherapy.    05/05/2022 Survivorship   SCP delivered by Cira Rue, NP      INTERVAL HISTORY:  JALESHA PLOTZ is here for a follow up of breast cancer. She was last seen by NP Lacie on 05/27/22. She presents to the clinic accompanied by her sister. They tell me she had surgery  for a bone spur in her cervical spine in mid August 2023, then she developed pneumonia 06/27/22. She is also having issues with her right arm and her shoulder. I asked about her recent message regarding heavy periods. She tells me after she held the tamoxifen, she had an even heavier period with clots that lasted about 2 weeks. This improved after  restarting tamoxifen.   All other systems were reviewed with the patient and are negative.  MEDICAL HISTORY:  Past Medical History:  Diagnosis Date   Breast cancer ([place]) 08/20/2021   Sickle cell trait ([place])     SURGICAL HISTORY: Past Surgical History:  Procedure Laterality Date   BREAST LUMPECTOMY WITH RADIOACTIVE SEED AND SENTINEL LYMPH NODE BIOPSY Right 10/11/2021   Procedure: RIGHT BREAST LUMPECTOMY WITH RADIOACTIVE SEED LOCALIZATION AND SENTINEL LYMPH NODE BIOPSY;  Surgeon: [name], MD;  Location: [place];  Service: General;  Laterality: Right;   NECK SURGERY     TUBAL LIGATION      I have reviewed the social history and family history with the patient and they are unchanged from previous note.  ALLERGIES:  is allergic to meloxicam.  MEDICATIONS:  Current Outpatient Medications  Medication Sig Dispense Refill   tamoxifen (NOLVADEX) 20 MG tablet Take 1 tablet (20 mg total) by mouth daily. 90 tablet 1   oxyCODONE-acetaminophen (PERCOCET/ROXICET) 5-325 MG tablet Take by mouth every 6 (six) hours as needed for severe pain.     tiZANidine (ZANAFLEX) 4 MG tablet Take 1 tablet (4 mg total) by mouth every 8 (eight) hours as needed for muscle spasms. 40 tablet 1   venlafaxine XR (EFFEXOR-XR) 75 MG 24 hr capsule TAKE 1 CAPSULE BY MOUTH ONCE DAILY WITH BREAKFAST 30 capsule 0   No current facility-administered medications for this visit.    PHYSICAL EXAMINATION: ECOG PERFORMANCE STATUS: 1 - Symptomatic but completely ambulatory  Vitals:   08/08/22 0931  BP: 124/71  Pulse: 91  Resp: 15  Temp: 98.1 F (36.7 C)  SpO2: 100%   Wt Readings from Last 3 Encounters:  08/08/22 158 lb 3.2 oz (71.8 kg)  08/04/22 158 lb 6 oz (71.8 kg)  06/27/22 157 lb (71.2 kg)     GENERAL:alert, no distress and comfortable SKIN: skin color, texture, turgor are normal, no rashes or significant lesions EYES: normal, Conjunctiva are pink and non-injected, sclera clear  NECK:  supple, thyroid normal size, non-tender, without nodularity LYMPH:  no palpable lymphadenopathy in the cervical, axillary LUNGS: clear to auscultation and percussion with normal breathing effort HEART: regular rate & rhythm and no murmurs and no lower extremity edema ABDOMEN:abdomen soft, non-tender and normal bowel sounds Musculoskeletal:no cyanosis of digits and no clubbing  NEURO: alert & oriented x 3 with fluent speech, no focal motor/sensory deficits BREAST: No palpable mass, nodules or adenopathy bilaterally. Breast exam benign.   LABORATORY DATA:  I have reviewed the data as listed    Latest Ref Rng & Units 08/08/2022    9:09 AM 06/27/2022    8:46 PM 11/11/2021    6:53 PM  CBC  WBC 4.0 - 10.5 K/uL 6.9  10.4  6.5   Hemoglobin 12.0 - 15.0 g/dL 12.8  12.2  11.6   Hematocrit 36.0 - 46.0 % 40.1  37.4  36.7   Platelets 150 - 400 K/uL 328  409  344         Latest Ref Rng & Units 08/08/2022    9:09 AM 06/27/2022    8:46  PM 11/11/2021    6:53 PM  CMP  Glucose 70 - 99 mg/dL 81  92  94   BUN 6 - 20 mg/dL _0 Creatinine 0.44 - 1.00 mg/dL 0.86  0.98  0.84   Sodium 135 - 145 mmol/L 141  138  141   Potassium 3.5 - 5.1 mmol/L 3.6  3.8  3.8   Chloride 98 - 111 mmol/L 107  106  111   CO2 22 - 32 mmol/L _1 Calcium 8.9 - 10.3 mg/dL 9.2  9.2  9.2   Total Protein 6.5 - 8.1 g/dL 7.1  6.7    Total Bilirubin 0.3 - 1.2 mg/dL 0.4  0.3    Alkaline Phos 38 - 126 U/L 69  68    AST 15 - 41 U/L 18  20    ALT 0 - 44 U/L 19  14        RADIOGRAPHIC STUDIES: I have personally reviewed the radiological images as listed and agreed with the findings in the report. No results found.    No orders of the defined types were placed in this encounter.  All questions were answered. The patient knows to call the clinic with any problems, questions or concerns. No barriers to learning was detected. The total time spent in the appointment was 30 minutes.     Truitt Merle, MD 08/08/2022    I, Wilburn Mylar, am acting as scribe for Truitt Merle, MD.   I have reviewed the above documentation for accuracy and completeness, and I agree with the above.

## 2022-08-13 IMAGING — DX DG CERVICAL SPINE COMPLETE 4+V
5 series · 5 of 5 positions shown · non-contrast
Comparison: None.

CLINICAL DATA: LEFT arm pain.

EXAM:
CERVICAL SPINE - COMPLETE 4+ VIEW

[c-spine obl (1 of 2)]
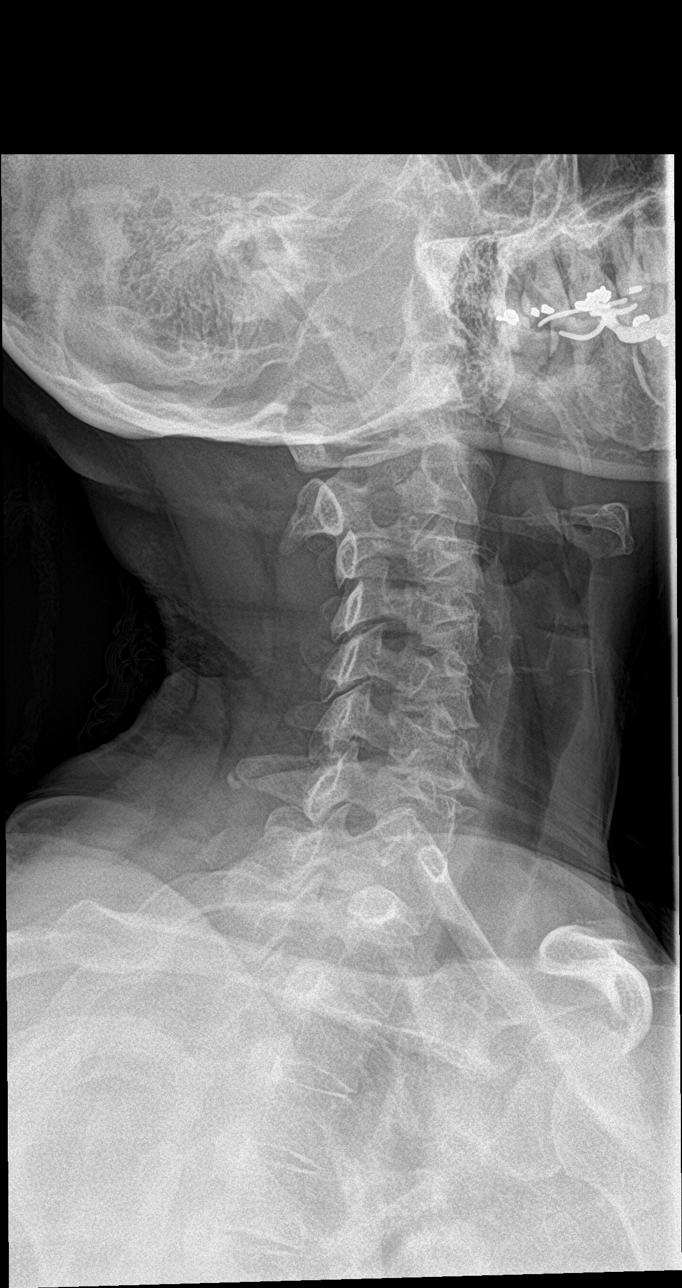

[c-spine obl (2 of 2)]
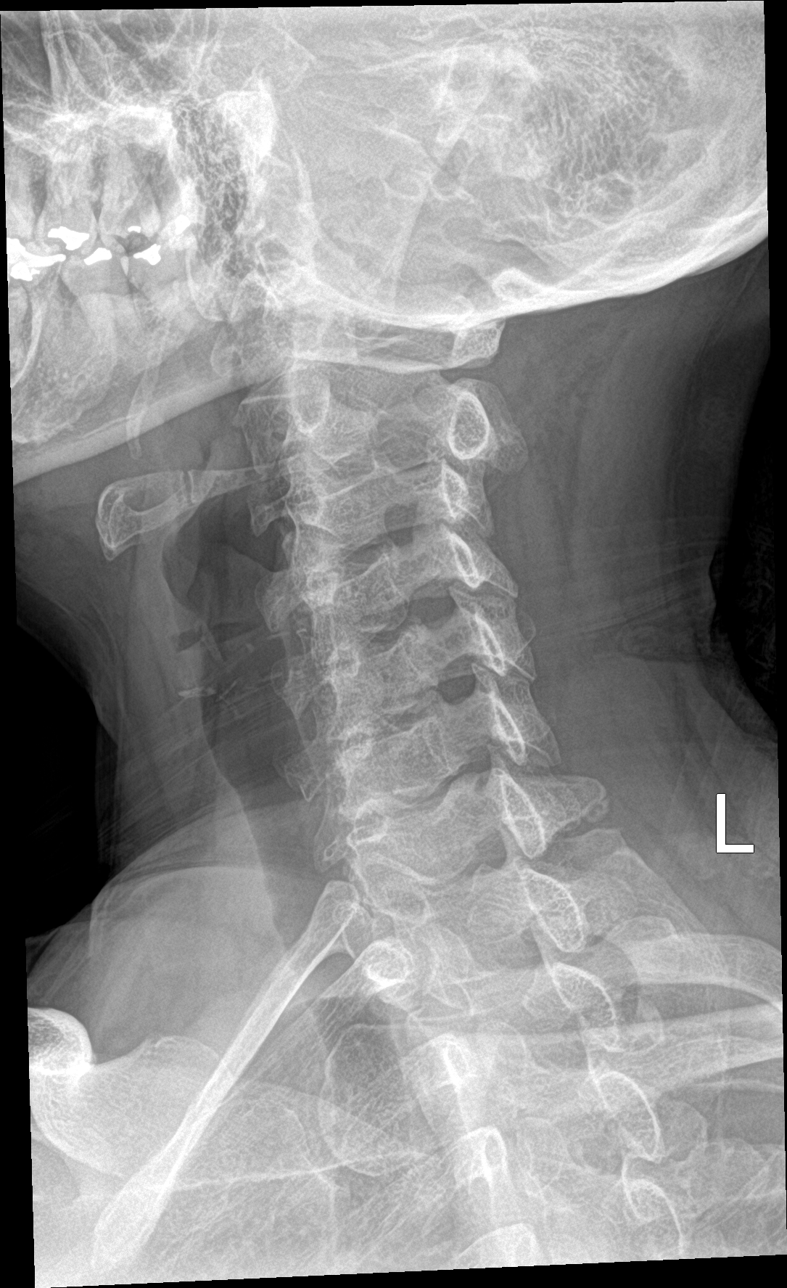

[c-spine ap]
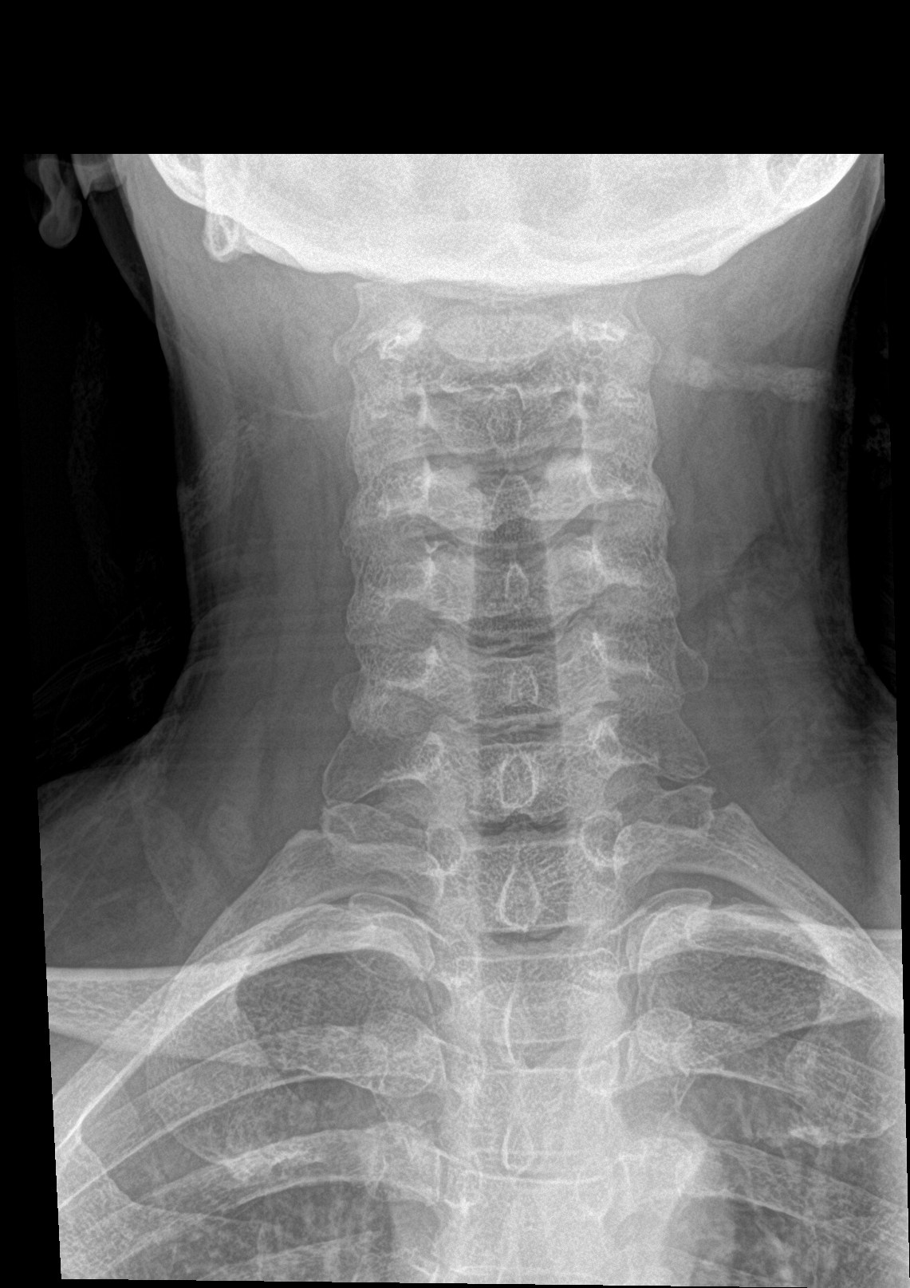

[c-spine open mouth]
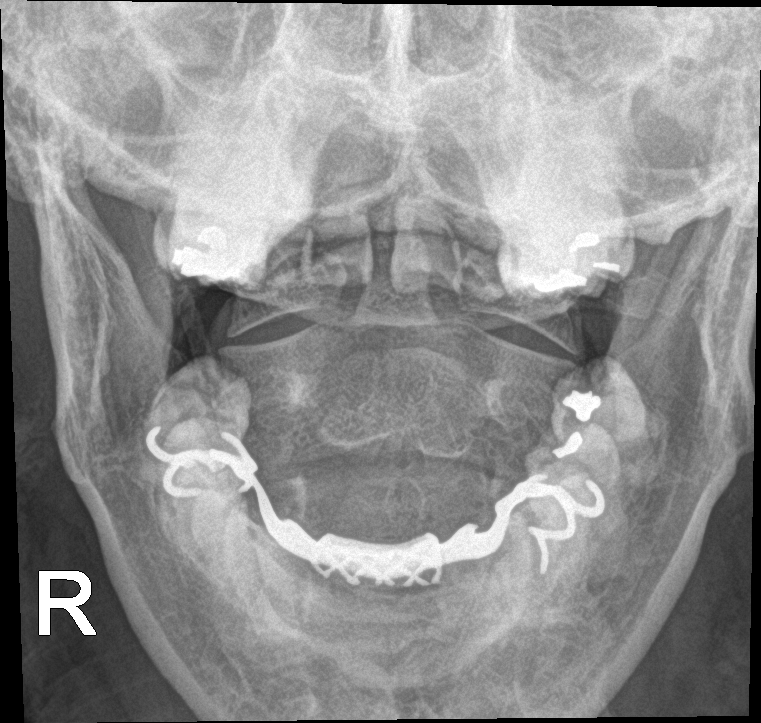

[c-spine lat]
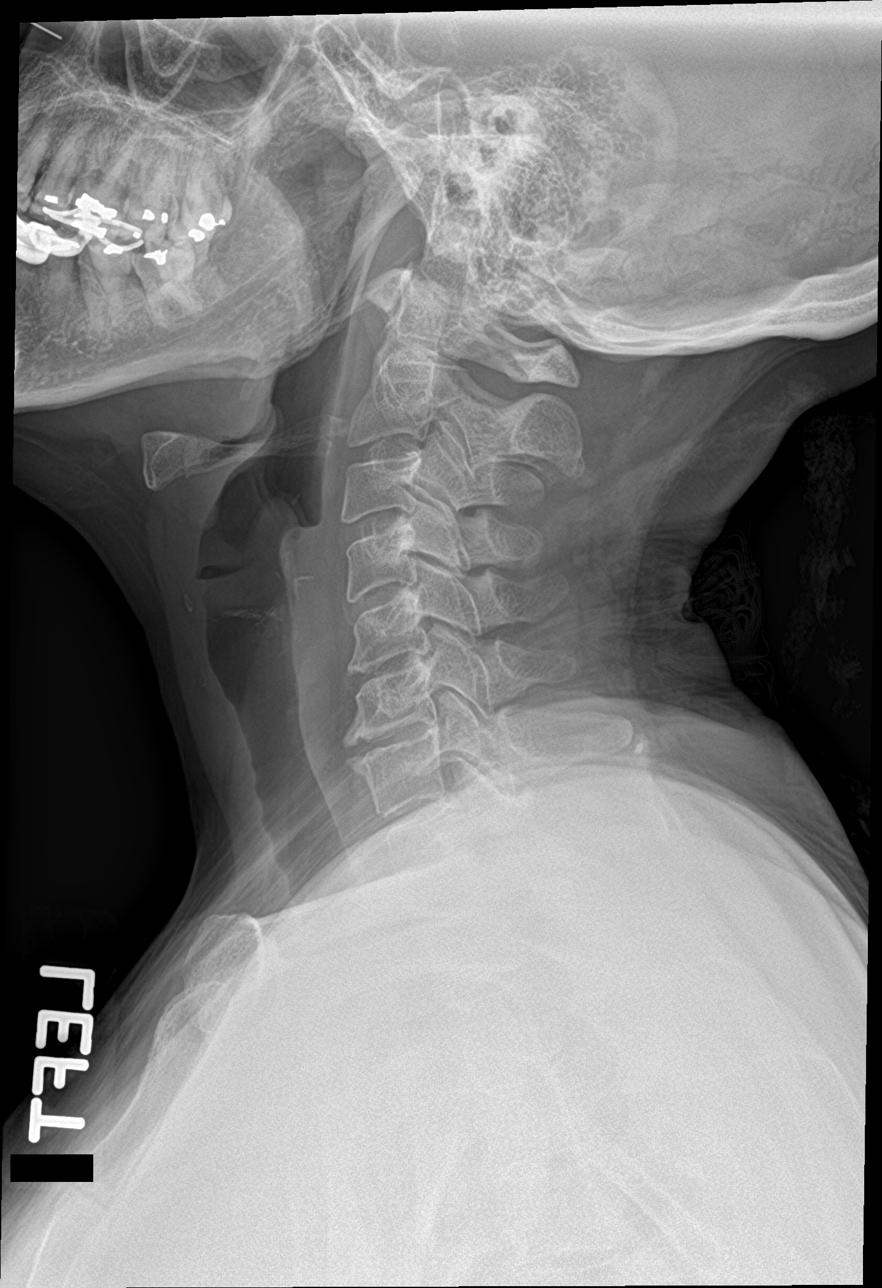

[5 of 5 positions shown; findings below may reference images not displayed]

FINDINGS: There is normal alignment of the cervical spine. Disc height loss
and uncovertebral spurring identified at C5-6 and C6-7. No lytic or
blastic lesions. Prevertebral soft tissues are unremarkable. No
acute fracture. Lung apices are unremarkable.
IMPRESSION: Mild degenerative changes.  No evidence for acute  abnormality.

## 2022-08-14 ENCOUNTER — Ambulatory Visit: Payer: BC Managed Care – PPO | Attending: Student | Admitting: Rehabilitative and Restorative Service Providers"

## 2022-08-14 ENCOUNTER — Encounter: Payer: Self-pay | Admitting: Rehabilitative and Restorative Service Providers"

## 2022-08-14 ENCOUNTER — Other Ambulatory Visit: Payer: Self-pay

## 2022-08-14 DIAGNOSIS — M5412 Radiculopathy, cervical region: Secondary | ICD-10-CM | POA: Insufficient documentation

## 2022-08-14 DIAGNOSIS — R252 Cramp and spasm: Secondary | ICD-10-CM

## 2022-08-14 DIAGNOSIS — M25511 Pain in right shoulder: Secondary | ICD-10-CM

## 2022-08-14 DIAGNOSIS — M25611 Stiffness of right shoulder, not elsewhere classified: Secondary | ICD-10-CM | POA: Diagnosis not present

## 2022-08-14 DIAGNOSIS — R293 Abnormal posture: Secondary | ICD-10-CM | POA: Diagnosis not present

## 2022-08-14 DIAGNOSIS — M542 Cervicalgia: Secondary | ICD-10-CM | POA: Diagnosis not present

## 2022-08-14 DIAGNOSIS — M25612 Stiffness of left shoulder, not elsewhere classified: Secondary | ICD-10-CM

## 2022-08-14 DIAGNOSIS — M25621 Stiffness of right elbow, not elsewhere classified: Secondary | ICD-10-CM

## 2022-08-14 DIAGNOSIS — C50211 Malignant neoplasm of upper-inner quadrant of right female breast: Secondary | ICD-10-CM

## 2022-08-14 DIAGNOSIS — Z483 Aftercare following surgery for neoplasm: Secondary | ICD-10-CM

## 2022-08-14 DIAGNOSIS — I890 Lymphedema, not elsewhere classified: Secondary | ICD-10-CM

## 2022-08-14 NOTE — Therapy (Signed)
OUTPATIENT PHYSICAL THERAPY CERVICAL EVALUATION   Patient Name: Emily Montoya MRN: [id number] DOB:07/03/1971, 51 y.o., female Today's Date: 08/14/2022   PT End of Session - 08/14/22 1021     Visit Number 1    Date for PT Re-Evaluation 10/10/22    Authorization Type BCBS    PT Start Time 1015    PT Stop Time 1055    PT Time Calculation (min) 40 min    Activity Tolerance Patient tolerated treatment well    Behavior During Therapy [facility] for tasks assessed/performed             Past Medical History:  Diagnosis Date   Breast cancer ([place]) 08/20/2021   Sickle cell trait ([place])    Past Surgical History:  Procedure Laterality Date   BREAST LUMPECTOMY WITH RADIOACTIVE SEED AND SENTINEL LYMPH NODE BIOPSY Right 10/11/2021   Procedure: RIGHT BREAST LUMPECTOMY WITH RADIOACTIVE SEED LOCALIZATION AND SENTINEL LYMPH NODE BIOPSY;  Surgeon: [name], MD;  Location: [place];  Service: General;  Laterality: Right;   NECK SURGERY     TUBAL LIGATION     Patient Active Problem List   Diagnosis Date Noted   Genetic testing 09/23/2021   Malignant neoplasm of upper-inner quadrant of right breast in female, estrogen receptor positive ([place]) 08/29/2021    PCP: Wenda Low, MD  REFERRING PROVIDER: Eleonore Chiquito, NP   REFERRING DIAG: [phone] (ICD-10-CM) - Radiculopathy, cervical region   THERAPY DIAG:  Cervicalgia  Stiffness of right shoulder, not elsewhere classified  Stiffness of left shoulder, not elsewhere classified  Abnormal posture  Aftercare following surgery for neoplasm  Malignant neoplasm of upper-inner quadrant of right breast in female, estrogen receptor positive ([place])  Cramp and spasm  Lymphedema, not elsewhere classified  Stiffness of right elbow, not elsewhere classified  Acute pain of right shoulder  Rationale for Evaluation and Treatment Rehabilitation  ONSET DATE: 06/13/2022 anterior cervical surgery  SUBJECTIVE:                                                                                                                                                                                                          SUBJECTIVE STATEMENT: Pt reports that she was coming to PT for frozen shoulder and on 06/13/22, she underwent cervical surgery with anterior approach.  Pt states that she is still having problems with right arm lymphedema and frozen shoulder.  Pt is ready to be back in PT to work on the previous goals and address her cervical and back pain.  PERTINENT HISTORY:  R breast cancer ER+PR+, R breast lumpectomy and SLNB (0/7) on 10/11/21 , Cervical surgery on 06/13/2022  PAIN:  Are you having pain? Yes: NPRS scale: 7/10 Pain location: thoracic back Pain description: aching and throbbing Aggravating factors: walking Relieving factors: rest  PRECAUTIONS: Other: R breast cancer with lumpectomy and SLNB (0/7) on 10/11/2021  WEIGHT BEARING RESTRICTIONS No  FALLS:  Has patient fallen in last 6 months? Yes. 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  OBJECTIVE:   DIAGNOSTIC FINDINGS:  Left shoulder radiograph on 06/06/2022: FINDINGS: Minimal glenohumeral joint space narrowing. Minimal inferior glenoid degenerative osteophytosis. Normal alignment of the acromioclavicular joint. No acute fracture or dislocation. The visualized portion of the left lung is unremarkable.  Right shoulder radiograph on 06/06/2022: FINDINGS: Mild acromioclavicular joint space narrowing. The glenohumeral joint space is maintained. No acute  fracture or dislocation. Right axillary surgical clips are noted.   PATIENT SURVEYS:  Quick Dash 79.55%   COGNITION: Overall cognitive status: Within functional limits for tasks assessed   SENSATION: Numbness and tingling down left arm  POSTURE: forward head, anterior pelvic tilt, and weight shift right  PALPATION: Muscle spasms noted down bilat cervical region.  Some tenderness to palpation along right arm and shoulder   CERVICAL ROM:   Active ROM A/ROM (deg) eval  Flexion 15  Extension 30  Right lateral flexion 30  Left lateral flexion 30  Right rotation 36  Left rotation 33   (Blank rows = not tested)  UPPER EXTREMITY A/ROM: 08/14/2022: R elbow missing 60 degrees from full ext R shoulder flexion 63 degrees, abduction 70 degrees Left shoulder flexion 112 degrees, abduction 125 degrees  UPPER EXTREMITY MMT:  MMT Right eval Left eval  Shoulder flexion 2 4  Shoulder extension 2 4  Shoulder abduction 2 4  Shoulder adduction    Shoulder extension    Shoulder internal rotation 2 4  Shoulder external rotation 2 4  Middle trapezius    Lower trapezius    Elbow flexion    Elbow extension    Wrist flexion    Wrist extension    Wrist ulnar deviation    Wrist radial deviation    Wrist pronation    Wrist supination    Grip strength     (Blank rows = not tested)   FUNCTIONAL TESTS:  08/14/2022: 5 times sit to stand: 43.3 sec with increased thoracic and low back pain   TODAY'S TREATMENT:  08/14/2022:  Issued and reviewed HEP (see below) Check all possible CPT codes: 97164 - PT Re-evaluation, 97110- Therapeutic Exercise, 37106- Neuro Re-education, 97140 - Manual Therapy, 97530 - Therapeutic Activities, 97535 - Self Care, 97014 - Electrical stimulation (unattended), [id number] - Electrical stimulation (Manual), [id number] - Iontophoresis, and [id number] - Aquatic therapy    Check all conditions that are expected to impact treatment: Musculoskeletal disorders   If treatment  provided at initial evaluation, no treatment charged due to lack of authorization.        PATIENT EDUCATION:  Education details: Pt issued HEP Person educated: Patient Education method: Explanation, Demonstration, and Handouts Education comprehension: verbalized understanding   HOME EXERCISE  PROGRAM: Access Code: [email or web address] URL: [email or web address] Date: 08/14/2022 Prepared by: Shelby Dubin Cray Monnin  Exercises - Seated Cervical Flexion AROM  - 3 x daily - 7 x weekly - 1 sets - 3 reps - 20 hold - Seated Cervical Extension AROM  - 1 x daily - 7 x weekly - 2 sets - 10 reps - Seated Cervical Rotation AROM  - 3 x daily - 7 x weekly - 1 sets - 3 reps - 20 hold - Seated Cervical Retraction  - 1 x daily - 7 x weekly - 2 sets - 10 reps - Seated Shoulder Circles  - 1 x daily - 7 x weekly - 3 sets - 10 reps - Seated Scapular Retraction  - 1 x daily - 7 x weekly - 2 sets - 10 reps - Seated Shoulder Flexion Towel Slide at Table Top  - 1 x daily - 7 x weekly - 2 sets - 10 reps  ASSESSMENT:  CLINICAL IMPRESSION: Patient is a 51 y.o. female who was seen today for physical therapy evaluation and treatment for cervical radiculopathy. Pt is known to this PT clinic from past episodes for frozen shoulder and lymphedema of RUE.  Pt presents with multiple functional impairments and difficulty performing job tasks that would allow her to return full time to work.  Pt with pain and ROM restrictions noted with RUE lymphedema.  Pt would benefit from skilled PT to progress towards goal related activities.   OBJECTIVE IMPAIRMENTS decreased ROM, decreased strength, increased edema, impaired perceived functional ability, increased muscle spasms, impaired flexibility, impaired UE functional use, postural dysfunction, and pain.   ACTIVITY LIMITATIONS carrying, lifting, transfers, dressing, and reach over head  PARTICIPATION LIMITATIONS: cleaning, laundry, driving, community activity, and  occupation  PERSONAL FACTORS Past/current experiences, Time since onset of injury/illness/exacerbation, and 3+ comorbidities: breast cancer, R frozen shoulder, s/p cervical surgery  are also affecting patient's functional outcome.   REHAB POTENTIAL: Good  CLINICAL DECISION MAKING: Evolving/moderate complexity  EVALUATION COMPLEXITY: Moderate   GOALS: Goals reviewed with patient? Yes  SHORT TERM GOALS: Target date: 09/04/2022   Pt will be independent with initial HEP. Baseline:  Goal status: INITIAL  2.  Verbalize and demonstrate postural modifications for neutral alignment for reduced cervical strain Baseline:  Goal status: INITIAL  3.  Report 25% fewer sleep interruptions due to pain Baseline:  Goal status: INITIAL    LONG TERM GOALS: Target date: 10/10/2022  Pt will be independent with advanced HEP. Baseline:  Goal status: INITIAL  2.  Patient will improve DASH score to 60% or less to demonstrate improvements in functional mobility. Baseline: 79.55% Goal status: INITIAL  3.  Increase right shoulder flexion and abduction to at least 90 degrees to allow pt to have more use of her UE. Baseline: R flexion 63 deg, R abduction 70 deg Goal status: INITIAL  4.  Pt will increase cervical A/ROM by at least 10 degrees to allow pt to drive with improved ease. Baseline: see chart Goal status: INITIAL  5.  Pt will improve 5 times sit to/from stand to 25 sec or less to demonstrate improved functional strength. Baseline: 43.3 sec Goal status: INITIAL  6.  Report > or = to 70% reduction in the frequency and intensity of neck/back pain and Lt UE radiculopathy to facilitate return to work Baseline:  Goal status: INITIAL   PLAN: PT FREQUENCY: 2x/week  PT DURATION: 8 weeks  PLANNED INTERVENTIONS: Therapeutic exercises, Therapeutic activity, Neuromuscular re-education, Balance training, Gait training, Patient/Family education, Self  Care, Joint mobilization, Joint  manipulation, Stair training, Aquatic Therapy, Dry Needling, Electrical stimulation, Spinal manipulation, Spinal mobilization, Cryotherapy, Moist heat, Manual lymph drainage, Compression bandaging, scar mobilization, Splintting, Taping, Ionotophoresis '4mg'$ /ml Dexamethasone, Manual therapy, and Re-evaluation  PLAN FOR NEXT SESSION: Lymphedema assessment and drainage as indicated, strengthening, flexibility, manual/dry needling as indicated, assess and progress HEP as indicated   Madinah Quarry, PT 08/14/2022, 10:22 AM  [facility] [address] [place], [place] [address] Phone # [phone] Fax [phone]

## 2022-08-17 NOTE — Therapy (Incomplete)
OUTPATIENT PHYSICAL THERAPY CERVICAL EVALUATION   Patient Name: Emily Montoya MRN: [id number] DOB:04/18/1971, 51 y.o., female Today's Date: 08/17/2022     Past Medical History:  Diagnosis Date   Breast cancer ([place]) 08/20/2021   Sickle cell trait ([place])    Past Surgical History:  Procedure Laterality Date   BREAST LUMPECTOMY WITH RADIOACTIVE SEED AND SENTINEL LYMPH NODE BIOPSY Right 10/11/2021   Procedure: RIGHT BREAST LUMPECTOMY WITH RADIOACTIVE SEED LOCALIZATION AND SENTINEL LYMPH NODE BIOPSY;  Surgeon: [name], MD;  Location: [facility];  Service: General;  Laterality: Right;   NECK SURGERY     TUBAL LIGATION     Patient Active Problem List   Diagnosis Date Noted   Genetic testing 09/23/2021   Malignant neoplasm of upper-inner quadrant of right breast in female, estrogen receptor positive ([place]) 08/29/2021    PCP: Wenda Low, MD  REFERRING PROVIDER: Eleonore Chiquito, NP   REFERRING DIAG: M54.12 (ICD-10-CM) - Radiculopathy, cervical region   THERAPY DIAG:  No diagnosis found.  Rationale for Evaluation and Treatment Rehabilitation  ONSET DATE: 06/13/2022 anterior cervical surgery  SUBJECTIVE:                                                                                                                                                                                                         SUBJECTIVE STATEMENT: Pt reports that she was coming to PT for frozen shoulder and on 06/13/22, she underwent cervical surgery with anterior approach.  Pt states that she is still having problems with right arm lymphedema and frozen shoulder.  Pt is ready to be back in PT to work on the previous goals and address her cervical and back pain.  PERTINENT HISTORY:  R breast cancer ER+PR+, R breast lumpectomy and SLNB (0/7) on 10/11/21 , Cervical surgery on 06/13/2022  PAIN:  Are you having pain? Yes: NPRS scale: 7/10 Pain location: thoracic back Pain  description: aching and throbbing Aggravating factors: walking Relieving factors: rest  PRECAUTIONS: Other: R breast cancer with lumpectomy and SLNB (0/7) on 10/11/2021  WEIGHT BEARING RESTRICTIONS No  FALLS:  Has patient fallen in last 6 months? Yes. 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  OBJECTIVE:   DIAGNOSTIC FINDINGS:  Left shoulder radiograph on 06/06/2022: FINDINGS: Minimal glenohumeral joint space narrowing. Minimal inferior glenoid degenerative osteophytosis. Normal alignment of the acromioclavicular joint. No acute fracture or dislocation. The visualized portion of the left lung is unremarkable.  Right shoulder radiograph on 06/06/2022: FINDINGS: Mild acromioclavicular joint space narrowing. The glenohumeral joint space is maintained. No acute fracture or dislocation. Right axillary surgical clips are noted.   PATIENT SURVEYS:  Quick Dash 79.55%  SOZO Screen 08/04/2022 ;4 above baseline  COGNITION: Overall cognitive status: Within functional limits for tasks assessed   SENSATION: Numbness and tingling down left arm  POSTURE: forward head, anterior pelvic tilt, and weight shift right  PALPATION: Muscle spasms noted down bilat cervical region.  Some tenderness to palpation along right arm and shoulder   CERVICAL ROM:   Active ROM A/ROM (deg) eval  Flexion 15  Extension 30  Right lateral flexion 30  Left lateral flexion 30  Right rotation 36  Left rotation 33   (Blank rows = not tested)  UPPER EXTREMITY A/ROM: 08/14/2022: R elbow missing 60 degrees from  full ext R shoulder flexion 63 degrees, abduction 70 degrees Left shoulder flexion 112 degrees, abduction 125 degrees  UPPER EXTREMITY MMT:  MMT Right eval Left eval  Shoulder flexion 2 4  Shoulder extension 2 4  Shoulder abduction 2 4  Shoulder adduction    Shoulder extension    Shoulder internal rotation 2 4  Shoulder external rotation 2 4  Middle trapezius    Lower trapezius    Elbow flexion    Elbow extension    Wrist flexion    Wrist extension    Wrist ulnar deviation    Wrist radial deviation    Wrist pronation    Wrist supination    Grip strength     (Blank rows = not tested)   FUNCTIONAL TESTS:  08/14/2022: 5 times sit to stand: 43.3 sec with increased thoracic and low back pain  LYMPHEDEMA ASSESSMENTS:   SURGERY TYPE/DATE:10/11/2021 Right Lumpectomy and SLNB  NUMBER OF LYMPH NODES REMOVED: 0/7  CHEMOTHERAPY: NO  RADIATION:completed 2023  HORMONE TREATMENT: Tamoxifen  INFECTIONS: ***  LYMPHEDEMA ASSESSMENTS:   LANDMARK RIGHT  eval  10 cm proximal to olecranon process   Olecranon process   10 cm proximal to ulnar styloid process   Just proximal to ulnar styloid process   Across hand at thumb web space   At base of 2nd digit   (Blank rows = not tested)  LANDMARK LEFT  eval  10 cm proximal to olecranon process   Olecranon process   10 cm proximal to ulnar styloid process   Just proximal to ulnar styloid process   Across hand at thumb web space   At base of 2nd digit   (Blank rows = not tested)    TODAY'S TREATMENT:  08/14/2022:  Issued and reviewed HEP (see below) Check all possible CPT codes: 97164 - PT Re-evaluation, 97110- Therapeutic Exercise, 31497- Neuro Re-education, 97140 - Manual Therapy, 97530 - Therapeutic Activities, 97535 - Self Care, 97014 - Electrical stimulation (unattended), [id number] - Electrical stimulation (Manual), [id number] - Iontophoresis, and [id number] - Aquatic therapy    Check all conditions that are expected to impact  treatment: Musculoskeletal disorders   If treatment provided at initial evaluation, no treatment charged due to lack of authorization.        PATIENT EDUCATION:  Education details: Pt issued HEP Person educated: Patient Education method: Explanation,  Demonstration, and Handouts Education comprehension: verbalized understanding   HOME EXERCISE PROGRAM: Access Code: [email or web address] URL: [email or web address] Date: 08/14/2022 Prepared by: Shelby Dubin Menke  Exercises - Seated Cervical Flexion AROM  - 3 x daily - 7 x weekly - 1 sets - 3 reps - 20 hold - Seated Cervical Extension AROM  - 1 x daily - 7 x weekly - 2 sets - 10 reps - Seated Cervical Rotation AROM  - 3 x daily - 7 x weekly - 1 sets - 3 reps - 20 hold - Seated Cervical Retraction  - 1 x daily - 7 x weekly - 2 sets - 10 reps - Seated Shoulder Circles  - 1 x daily - 7 x weekly - 3 sets - 10 reps - Seated Scapular Retraction  - 1 x daily - 7 x weekly - 2 sets - 10 reps - Seated Shoulder Flexion Towel Slide at Table Top  - 1 x daily - 7 x weekly - 2 sets - 10 reps  ASSESSMENT:  CLINICAL IMPRESSION: Patient is a 51 y.o. female who was seen today for physical therapy evaluation and treatment for cervical radiculopathy. Pt is known to this PT clinic from past episodes for frozen shoulder and lymphedema of RUE.  Pt presents with multiple functional impairments and difficulty performing job tasks that would allow her to return full time to work.  Pt with pain and ROM restrictions noted with RUE lymphedema.  Pt would benefit from skilled PT to progress towards goal related activities.   OBJECTIVE IMPAIRMENTS decreased ROM, decreased strength, increased edema, impaired perceived functional ability, increased muscle spasms, impaired flexibility, impaired UE functional use, postural dysfunction, and pain.   ACTIVITY LIMITATIONS carrying, lifting, transfers, dressing, and reach over head  PARTICIPATION LIMITATIONS: cleaning,  laundry, driving, community activity, and occupation  PERSONAL FACTORS Past/current experiences, Time since onset of injury/illness/exacerbation, and 3+ comorbidities: breast cancer, R frozen shoulder, s/p cervical surgery  are also affecting patient's functional outcome.   REHAB POTENTIAL: Good  CLINICAL DECISION MAKING: Evolving/moderate complexity  EVALUATION COMPLEXITY: Moderate   GOALS: Goals reviewed with patient? Yes  SHORT TERM GOALS: Target date: 09/04/2022   Pt will be independent with initial HEP. Baseline:  Goal status: INITIAL  2.  Verbalize and demonstrate postural modifications for neutral alignment for reduced cervical strain Baseline:  Goal status: INITIAL  3.  Report 25% fewer sleep interruptions due to pain Baseline:  Goal status: INITIAL    LONG TERM GOALS: Target date: 10/10/2022  Pt will be independent with advanced HEP. Baseline:  Goal status: INITIAL  2.  Patient will improve DASH score to 60% or less to demonstrate improvements in functional mobility. Baseline: 79.55% Goal status: INITIAL  3.  Increase right shoulder flexion and abduction to at least 90 degrees to allow pt to have more use of her UE. Baseline: R flexion 63 deg, R abduction 70 deg Goal status: INITIAL  4.  Pt will increase cervical A/ROM by at least 10 degrees to allow pt to drive with improved ease. Baseline: see chart Goal status: INITIAL  5.  Pt will improve 5 times sit to/from stand to 25 sec or less to demonstrate improved functional strength. Baseline: 43.3 sec Goal status: INITIAL  6.  Report > or = to 70% reduction in the frequency and intensity of neck/back pain and Lt UE radiculopathy to facilitate return to work Baseline:  Goal status: INITIAL   PLAN: PT FREQUENCY: 2x/week  PT DURATION: 8 weeks  PLANNED INTERVENTIONS: Therapeutic exercises,  Therapeutic activity, Neuromuscular re-education, Balance training, Gait training, Patient/Family education, Self  Care, Joint mobilization, Joint manipulation, Stair training, Aquatic Therapy, Dry Needling, Electrical stimulation, Spinal manipulation, Spinal mobilization, Cryotherapy, Moist heat, Manual lymph drainage, Compression bandaging, scar mobilization, Splintting, Taping, Ionotophoresis '4mg'$ /ml Dexamethasone, Manual therapy, and Re-evaluation  PLAN FOR NEXT SESSION: Lymphedema assessment and drainage as indicated, strengthening, flexibility, manual/dry needling as indicated, assess and progress HEP as indicated   Claris Pong, PT 08/17/2022, 5:57 PM  [facility] [address] [place] [address] [place], [place] [address] Phone # [phone] Fax [phone]

## 2022-08-18 ENCOUNTER — Ambulatory Visit: Payer: BC Managed Care – PPO

## 2022-08-20 ENCOUNTER — Other Ambulatory Visit (HOSPITAL_BASED_OUTPATIENT_CLINIC_OR_DEPARTMENT_OTHER): Payer: Self-pay | Admitting: Orthopaedic Surgery

## 2022-08-20 ENCOUNTER — Ambulatory Visit (INDEPENDENT_AMBULATORY_CARE_PROVIDER_SITE_OTHER): Payer: BC Managed Care – PPO | Admitting: Psychologist

## 2022-08-20 ENCOUNTER — Telehealth: Payer: Self-pay | Admitting: Orthopaedic Surgery

## 2022-08-20 ENCOUNTER — Ambulatory Visit: Payer: BC Managed Care – PPO

## 2022-08-20 DIAGNOSIS — R293 Abnormal posture: Secondary | ICD-10-CM

## 2022-08-20 DIAGNOSIS — M25611 Stiffness of right shoulder, not elsewhere classified: Secondary | ICD-10-CM

## 2022-08-20 DIAGNOSIS — M542 Cervicalgia: Secondary | ICD-10-CM

## 2022-08-20 DIAGNOSIS — M25612 Stiffness of left shoulder, not elsewhere classified: Secondary | ICD-10-CM

## 2022-08-20 DIAGNOSIS — F4521 Hypochondriasis: Secondary | ICD-10-CM

## 2022-08-20 DIAGNOSIS — M5412 Radiculopathy, cervical region: Secondary | ICD-10-CM | POA: Diagnosis not present

## 2022-08-20 NOTE — Progress Notes (Signed)
[facility] Counselor/Therapist Progress Note  Patient ID: Emily Montoya, MRN: [id number],    Date: 08/20/2022  Time Spent: 09:05 am to 09:43 am; total time: 38 minutes   This session was held via in person. The patient consented to in-person therapy and was in the clinician's office. Limits of confidentiality were discussed with the patient.   Treatment Type: Individual Therapy  Reported Symptoms: Distress due to changes following cancer treatment  Mental Status Exam: Appearance:  Well Groomed     Behavior: Appropriate  Motor: Normal  Speech/Language:  Clear and Coherent  Affect: Appropriate  Mood: normal  Thought process: normal  Thought content:   WNL  Sensory/Perceptual disturbances:   WNL  Orientation: oriented to person, place, and time/date  Attention: Good  Concentration: Good  Memory: WNL  Fund of knowledge:  Good  Insight:   Good  Judgment:  Good  Impulse Control: Good   Risk Assessment: Danger to Self:  No Self-injurious Behavior: No Danger to Others: No Duty to Warn:no Physical Aggression / Violence:No  Access to Firearms a concern: No  Gang Involvement:No   Subjective: Beginning the session patient described herself as doing poorly and having a difficult time adjusting to life after going through cancer treatment. Specifically, she voiced that her family treats her differently as they are communicating with her more frequently than before and that she feels like she has lost independence from them. She also reflected on how she has experienced negative physical affects due to oncology treatment. Patient processed the different emotions she is experiencing. She reflected on how changes have been difficult for her. She voiced that coping skills have helped and that she would like to use them more regularly. She processed thoughts and emotions. She agreed to follow up in person. She denied suicidal and homicidal ideation.    Interventions:   Worked on developing a therapeutic relationship with the patient using active listening and reflective statements. Provided emotional support using empathy and validation. Reviewed the treatment plan with the patient. Praised patient for being able to attend the appointment in person. Processed thoughts and emotions. Reviewed events since the last session. Normalized and validated thoughts. Identified goals for the session. Explored some of the frustrations that the patient is experiencing with her family. Began to explore how she would like family members to respond to her needs. Processed the challenges patients is experiencing as a result of oncology treatment. Reviewed whether or not patient was using coping strategies. Praised patient for using coping strategies. Reflected on ways that patient can celebrate being a year removed from being diagnosed with cancer. Encouraged patient to speak with medical team regarding hormones and whether or not they are contributing to patient's current state. Encouraged patient to use coping strategies more frequently if they assist. Provided empathic statements. Assessed for suicidal and homicidal ideation.   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  Plan:   Goals Work through the grieving process and face reality of own death Accept emotional support from others around them Live life to the fullest, event though time may be limited Become as knowledgeable about the medical condition  Reduce fear, anxiety about the health condition  Accept the illness Accept the role of psychological and behavioral factors  Stabilize anxiety level wile increasing ability to function Learn and implement coping skills that result in a reduction of anxiety  Alleviate depressive  symptoms Recognize, accept, and cope with depressive feelings Develop healthy  thinking patterns Develop healthy interpersonal relationships  Objectives target date for all objectives is 04/26/2023 Identify feelings associated with the illness Family members share with each other feelings Identify the losses or limitations that have been experienced Verbalize acceptance of the reality of the medical condition Commit to learning and implement a proactive approach to managing personal stresses Verbalize an understanding of the medical condition Work with therapist to develop a plan for coping with stress Learn and implement skills for managing stress Engage in social, productive activities that are possible Engage in faith based activities implement positive imagery Identify coping skills and sources of emotional support Patient's partner and family members verbalize their fears regarding severity of health condition Identify sources of emotional distress  Learning and implement calming skills to reduce overall anxiety Learn and implement problem solving strategies Identify and engage in pleasant activities Learning and implement personal and interpersonal skills to reduce anxiety and improve interpersonal relationships Learn to accept limitations in life and commit to tolerating, rather than avoiding, unpleasant emotions while accomplishing meaningful goals Identify major life conflicts from the past and present that form the basis for present anxiety Learn and implement behavioral strategies Verbalize an understanding and resolution of current interpersonal problems Learn and implement problem solving and decision making skills Learn and implement conflict resolution skills to resolve interpersonal problems Verbalize an understanding of healthy and unhealthy emotions verbalize insight into how past relationships may be influence current experiences with depression Use mindfulness and acceptance strategies and increase value based behavior  Increase hopeful  statements about the future.   Interventions Teach about stress and ways to handle stress Assist the patient in developing a coping action plan for stressors Conduct skills based training for coping strategies Train problem focused skills Sort out what activities the individual can do Encourage patient to rely upon his/her spiritual faith Teach the patient to use guided imagery Probe and evaluate family's ability to provide emotional support Allow family to share their fears Assist the patient in identifying, sorting through, and verbalizing the various feelings generated by his/her medical condition Meet with family members  Ask patient list out limitations  Use stress inoculation training  Use Acceptance and Commitment Therapy to help client accept uncomfortable realities in order to accomplish value-consistent goals Reinforce the client's insight into the role of his/her past emotional pain and present anxiety  Discuss examples demonstrating that unrealistic worry overestimates the probability of threats and underestimate patient's ability  Assist the patient in analyzing his or her worries Help patient understand that avoidance is reinforcing  Behavioral activation help the client explore the relationship, nature of the dispute,  Help the client develop new interpersonal skills and relationships Conduct Problem so living therapy Teach conflict resolution skills Use a process-experiential approach Conduct TLDP Conduct ACT  The patient and clinician reviewed the treatment plan on 05/20/2022. The patient approved of the treatment plan.   Conception Chancy, PsyD

## 2022-08-20 NOTE — Telephone Encounter (Signed)
Pt is in the office today and I helped her get scheduled for bil shoulder mri but this pt states that she also needs one for her neck pain. Can we please send this as urgent?

## 2022-08-20 NOTE — Therapy (Signed)
OUTPATIENT PHYSICAL THERAPY CERVICAL EVALUATION   Patient Name: Emily Montoya MRN: [id number] DOB:11-16-1970, 51 y.o., female Today's Date: 08/20/2022   PT End of Session - 08/20/22 1238     Visit Number 2    Date for PT Re-Evaluation 10/10/22    Authorization Type [employer]    PT Start Time 1200   late   PT Stop Time 1237    PT Time Calculation (min) 37 min    Activity Tolerance Patient tolerated treatment well    Behavior During Therapy [facility] for tasks assessed/performed              Past Medical History:  Diagnosis Date   Breast cancer ([place]) 08/20/2021   Sickle cell trait ([place])    Past Surgical History:  Procedure Laterality Date   BREAST LUMPECTOMY WITH RADIOACTIVE SEED AND SENTINEL LYMPH NODE BIOPSY Right 10/11/2021   Procedure: RIGHT BREAST LUMPECTOMY WITH RADIOACTIVE SEED LOCALIZATION AND SENTINEL LYMPH NODE BIOPSY;  Surgeon: [name], MD;  Location: [facility];  Service: General;  Laterality: Right;   NECK SURGERY     TUBAL LIGATION     Patient Active Problem List   Diagnosis Date Noted   Genetic testing 09/23/2021   Malignant neoplasm of upper-inner quadrant of right breast in female, estrogen receptor positive ([place]) 08/29/2021    PCP: Wenda Low, MD  REFERRING PROVIDER: Eleonore Chiquito, NP   REFERRING DIAG: [phone] (ICD-10-CM) - Radiculopathy, cervical region   THERAPY DIAG:  Cervicalgia  Stiffness of right shoulder, not elsewhere classified  Stiffness of left shoulder, not elsewhere classified  Abnormal posture  Rationale for Evaluation and Treatment Rehabilitation  ONSET DATE: 06/13/2022 anterior cervical surgery  SUBJECTIVE:                                                                                                                                                                                                         SUBJECTIVE STATEMENT: I am having issues with my low back now and I think I need to see my  gynecologist.  This seems to be related to my cycle.    PERTINENT HISTORY:  R breast cancer ER+PR+, R breast lumpectomy and SLNB (0/7) on 10/11/21 , Cervical surgery on 06/13/2022  PAIN:  Are you having pain? Yes: NPRS scale: 6-7/10 Pain location: thoracic back Pain description: aching and throbbing Aggravating factors: walking Relieving factors: rest  PRECAUTIONS: Other: R breast cancer with lumpectomy and SLNB (0/7) on 10/11/2021  WEIGHT BEARING RESTRICTIONS No  FALLS:  Has patient fallen in last 6 months?  Yes. 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  OBJECTIVE:   DIAGNOSTIC FINDINGS:  Left shoulder radiograph on 06/06/2022: FINDINGS: Minimal glenohumeral joint space narrowing. Minimal inferior glenoid degenerative osteophytosis. Normal alignment of the acromioclavicular joint. No acute fracture or dislocation. The visualized portion of the left lung is unremarkable.  Right shoulder radiograph on 06/06/2022: FINDINGS: Mild acromioclavicular joint space narrowing. The glenohumeral joint space is maintained. No acute fracture or dislocation. Right axillary surgical clips are noted.   PATIENT SURVEYS:  Quick Dash 79.55%   COGNITION: Overall cognitive status: Within functional limits for tasks assessed   SENSATION: Numbness and tingling down left arm  POSTURE: forward head, anterior pelvic tilt, and weight shift right  PALPATION: Muscle spasms noted down bilat cervical region.  Some tenderness to palpation along right arm and shoulder   CERVICAL ROM:   Active ROM A/ROM  (deg) eval  Flexion 15  Extension 30  Right lateral flexion 30  Left lateral flexion 30  Right rotation 36  Left rotation 33   (Blank rows = not tested)  UPPER EXTREMITY A/ROM: 08/14/2022: R elbow missing 60 degrees from full ext R shoulder flexion 63 degrees, abduction 70 degrees Left shoulder flexion 112 degrees, abduction 125 degrees  UPPER EXTREMITY MMT:  MMT Right eval Left eval  Shoulder flexion 2 4  Shoulder extension 2 4  Shoulder abduction 2 4  Shoulder adduction    Shoulder extension    Shoulder internal rotation 2 4  Shoulder external rotation 2 4  Middle trapezius    Lower trapezius    Elbow flexion    Elbow extension    Wrist flexion    Wrist extension    Wrist ulnar deviation    Wrist radial deviation    Wrist pronation    Wrist supination    Grip strength     (Blank rows = not tested)   FUNCTIONAL TESTS:  08/14/2022: 5 times sit to stand: 43.3 sec with increased thoracic and low back pain   TODAY'S TREATMENT:  Date: 08/20/22 Cervical A/ROM 3 ways: 3x20 seconds- guarded with all movement Seated scapular retraction x10 Seated thoracic rotation  Supine cervical rotation x3 each  Chest press in supine with cane x 5- partial ROM with Rt UE due to elbow contracture   08/14/2022:  Issued and reviewed HEP (see below) Check all possible CPT codes: 97164 - PT Re-evaluation, 97110- Therapeutic Exercise, 93810- Neuro Re-education, 97140 - Manual Therapy, 97530 - Therapeutic Activities, 97535 - Self Care, 97014 - Electrical stimulation (unattended), [id number] - Electrical stimulation (Manual), [id number] - Iontophoresis, and [id number] - Aquatic therapy    Check all conditions that are expected to impact treatment: Musculoskeletal disorders   If treatment provided at initial evaluation, no treatment charged due to lack of authorization.        PATIENT EDUCATION:  Education details: Pt issued HEP Person educated: Patient Education method: Explanation,  Demonstration, and Handouts Education comprehension: verbalized understanding   HOME EXERCISE PROGRAM: Access Code: [email or web address] URL: [email or web address] Date: 08/14/2022 Prepared by: Juel Burrow  Exercises - Seated Cervical Flexion AROM  - 3 x daily - 7  x weekly - 1 sets - 3 reps - 20 hold - Seated Cervical Extension AROM  - 1 x daily - 7 x weekly - 2 sets - 10 reps - Seated Cervical Rotation AROM  - 3 x daily - 7 x weekly - 1 sets - 3 reps - 20 hold - Seated Cervical Retraction  - 1 x daily - 7 x weekly - 2 sets - 10 reps - Seated Shoulder Circles  - 1 x daily - 7 x weekly - 3 sets - 10 reps - Seated Scapular Retraction  - 1 x daily - 7 x weekly - 2 sets - 10 reps - Seated Shoulder Flexion Towel Slide at Table Top  - 1 x daily - 7 x weekly - 2 sets - 10 reps  ASSESSMENT:  CLINICAL IMPRESSION: Pt reports minimal compliance with HEP.  Session spent with review of cervical A/ROM and gentle mobility exercises.  Pt is extremely guarded and required tactile and verbal cues to reduce substitution and compensatory motion.  Pt demonstrated improved A/ROM with cueing and increased reps. Pt is reluctant to move her Rt UE due to frozen shoulder and pain associated. PT educated pt regarding the importance of compliance with HEP and need for mobility to improve function and reduce pain. Pt would benefit from skilled PT to progress towards goal related activities.   OBJECTIVE IMPAIRMENTS decreased ROM, decreased strength, increased edema, impaired perceived functional ability, increased muscle spasms, impaired flexibility, impaired UE functional use, postural dysfunction, and pain.   ACTIVITY LIMITATIONS carrying, lifting, transfers, dressing, and reach over head  PARTICIPATION LIMITATIONS: cleaning, laundry, driving, community activity, and occupation  PERSONAL FACTORS Past/current experiences, Time since onset of injury/illness/exacerbation, and 3+ comorbidities: breast cancer, R  frozen shoulder, s/p cervical surgery  are also affecting patient's functional outcome.   REHAB POTENTIAL: Good  CLINICAL DECISION MAKING: Evolving/moderate complexity  EVALUATION COMPLEXITY: Moderate   GOALS: Goals reviewed with patient? Yes  SHORT TERM GOALS: Target date: 09/04/2022   Pt will be independent with initial HEP. Baseline:  Goal status: INITIAL  2.  Verbalize and demonstrate postural modifications for neutral alignment for reduced cervical strain Baseline:  Goal status: INITIAL  3.  Report 25% fewer sleep interruptions due to pain Baseline:  Goal status: INITIAL    LONG TERM GOALS: Target date: 10/10/2022  Pt will be independent with advanced HEP. Baseline:  Goal status: INITIAL  2.  Patient will improve DASH score to 60% or less to demonstrate improvements in functional mobility. Baseline: 79.55% Goal status: INITIAL  3.  Increase right shoulder flexion and abduction to at least 90 degrees to allow pt to have more use of her UE. Baseline: R flexion 63 deg, R abduction 70 deg Goal status: INITIAL  4.  Pt will increase cervical A/ROM by at least 10 degrees to allow pt to drive with improved ease. Baseline: see chart Goal status: INITIAL  5.  Pt will improve 5 times sit to/from stand to 25 sec or less to demonstrate improved functional strength. Baseline: 43.3 sec Goal status: INITIAL  6.  Report > or = to 70% reduction in the frequency and intensity of neck/back pain and Lt UE radiculopathy to facilitate return to work Baseline:  Goal status: INITIAL   PLAN: PT FREQUENCY: 2x/week  PT DURATION: 8 weeks  PLANNED INTERVENTIONS: Therapeutic exercises, Therapeutic activity, Neuromuscular re-education, Balance training, Gait training, Patient/Family education, Self Care, Joint mobilization, Joint manipulation, Stair training, Aquatic Therapy, Dry Needling, Electrical stimulation, Spinal manipulation, Spinal  mobilization, Cryotherapy, Moist heat,  Manual lymph drainage, Compression bandaging, scar mobilization, Splintting, Taping, Ionotophoresis '4mg'$ /ml Dexamethasone, Manual therapy, and Re-evaluation  PLAN FOR NEXT SESSION: encourage mobility and reduce pt guarding, UE A/ROM, thoracic mobility, postural strength  Sigurd Sos, PT 08/20/22 12:39 PM   [facility] [address] [place], [place] [address] Phone # [phone] Fax [phone]

## 2022-08-25 ENCOUNTER — Other Ambulatory Visit: Payer: Self-pay

## 2022-08-25 ENCOUNTER — Ambulatory Visit: Payer: BC Managed Care – PPO | Admitting: Physical Therapy

## 2022-08-25 ENCOUNTER — Other Ambulatory Visit: Payer: Self-pay | Admitting: Hematology

## 2022-08-25 DIAGNOSIS — N9489 Other specified conditions associated with female genital organs and menstrual cycle: Secondary | ICD-10-CM | POA: Diagnosis not present

## 2022-08-25 DIAGNOSIS — N939 Abnormal uterine and vaginal bleeding, unspecified: Secondary | ICD-10-CM | POA: Diagnosis not present

## 2022-08-25 DIAGNOSIS — R3915 Urgency of urination: Secondary | ICD-10-CM | POA: Diagnosis not present

## 2022-08-25 DIAGNOSIS — R350 Frequency of micturition: Secondary | ICD-10-CM | POA: Diagnosis not present

## 2022-08-25 DIAGNOSIS — Z79810 Long term (current) use of selective estrogen receptor modulators (SERMs): Secondary | ICD-10-CM | POA: Diagnosis not present

## 2022-08-25 DIAGNOSIS — Z3202 Encounter for pregnancy test, result negative: Secondary | ICD-10-CM | POA: Diagnosis not present

## 2022-08-25 DIAGNOSIS — Z86018 Personal history of other benign neoplasm: Secondary | ICD-10-CM | POA: Diagnosis not present

## 2022-08-25 HISTORY — PX: ENDOMETRIAL BIOPSY: SHX622

## 2022-08-28 ENCOUNTER — Telehealth: Payer: Self-pay | Admitting: Rehabilitative and Restorative Service Providers"

## 2022-08-28 ENCOUNTER — Ambulatory Visit: Payer: BC Managed Care – PPO | Attending: Hematology | Admitting: Rehabilitative and Restorative Service Providers"

## 2022-08-28 DIAGNOSIS — R252 Cramp and spasm: Secondary | ICD-10-CM | POA: Insufficient documentation

## 2022-08-28 DIAGNOSIS — R293 Abnormal posture: Secondary | ICD-10-CM | POA: Insufficient documentation

## 2022-08-28 DIAGNOSIS — M542 Cervicalgia: Secondary | ICD-10-CM | POA: Insufficient documentation

## 2022-08-28 DIAGNOSIS — M25611 Stiffness of right shoulder, not elsewhere classified: Secondary | ICD-10-CM | POA: Insufficient documentation

## 2022-08-28 DIAGNOSIS — I890 Lymphedema, not elsewhere classified: Secondary | ICD-10-CM | POA: Insufficient documentation

## 2022-08-28 DIAGNOSIS — M25612 Stiffness of left shoulder, not elsewhere classified: Secondary | ICD-10-CM | POA: Insufficient documentation

## 2022-08-28 NOTE — Telephone Encounter (Signed)
Attempted to call patient and notify her of her missed appointment and remind of the attendance policy.  When cell phone dialed, the phone was a non-working number and unable to leave a message.

## 2022-09-01 ENCOUNTER — Ambulatory Visit: Payer: BC Managed Care – PPO | Admitting: Physical Therapy

## 2022-09-01 ENCOUNTER — Encounter: Payer: Self-pay | Admitting: Physical Therapy

## 2022-09-01 DIAGNOSIS — M542 Cervicalgia: Secondary | ICD-10-CM

## 2022-09-01 DIAGNOSIS — I890 Lymphedema, not elsewhere classified: Secondary | ICD-10-CM

## 2022-09-01 DIAGNOSIS — M25611 Stiffness of right shoulder, not elsewhere classified: Secondary | ICD-10-CM | POA: Diagnosis not present

## 2022-09-01 DIAGNOSIS — M25612 Stiffness of left shoulder, not elsewhere classified: Secondary | ICD-10-CM | POA: Diagnosis not present

## 2022-09-01 DIAGNOSIS — R252 Cramp and spasm: Secondary | ICD-10-CM | POA: Diagnosis not present

## 2022-09-01 DIAGNOSIS — R293 Abnormal posture: Secondary | ICD-10-CM | POA: Diagnosis not present

## 2022-09-01 NOTE — Therapy (Addendum)
OUTPATIENT PHYSICAL THERAPY ONCOLOGY/CERVICAL TREATMENT AND LATE ENTRY DISCHARGE SUMMARY   Patient Name: Emily Montoya MRN: [id number] DOB:03/29/1971, 51 y.o., female Today's Date: 09/01/2022   PT End of Session - 09/01/22 1208     Visit Number 3    Number of Visits 12    Date for PT Re-Evaluation 10/10/22    PT Start Time 1207    PT Stop Time 1247    PT Time Calculation (min) 40 min    Activity Tolerance Patient tolerated treatment well    Behavior During Therapy [facility] for tasks assessed/performed              Past Medical History:  Diagnosis Date   Breast cancer ([place]) 08/20/2021   Sickle cell trait ([place])    Past Surgical History:  Procedure Laterality Date   BREAST LUMPECTOMY WITH RADIOACTIVE SEED AND SENTINEL LYMPH NODE BIOPSY Right 10/11/2021   Procedure: RIGHT BREAST LUMPECTOMY WITH RADIOACTIVE SEED LOCALIZATION AND SENTINEL LYMPH NODE BIOPSY;  Surgeon: [name], MD;  Location: [facility];  Service: General;  Laterality: Right;   NECK SURGERY     TUBAL LIGATION     Patient Active Problem List   Diagnosis Date Noted   Genetic testing 09/23/2021   Malignant neoplasm of upper-inner quadrant of right breast in female, estrogen receptor positive ([place]) 08/29/2021    PCP: Wenda Low, MD  REFERRING PROVIDER: Eleonore Chiquito, NP   REFERRING DIAG: [phone] (ICD-10-CM) - Radiculopathy, cervical region   THERAPY DIAG:  Lymphedema, not elsewhere classified  Cramp and spasm  Cervicalgia  Stiffness of right shoulder, not elsewhere classified  Stiffness of left shoulder, not elsewhere classified  Abnormal posture  Rationale for Evaluation and Treatment Rehabilitation  ONSET DATE: 06/13/2022 anterior cervical surgery  SUBJECTIVE:                                                                                                                                                                                                          SUBJECTIVE STATEMENT: The breast has been swelling for four months. I had a seroma and the doctor said I should get it drained but I did not do it. I was using heat on it. The breast got really red.   PERTINENT HISTORY:  R breast cancer ER+PR+, R breast lumpectomy and SLNB (0/7) on 10/11/21 , Cervical surgery on 06/13/2022  PAIN:  Are you having pain? Yes: NPRS scale: 6-7/10 Pain location: thoracic back Pain description: aching and throbbing Aggravating factors: walking Relieving factors: rest  PRECAUTIONS: Other: R breast  cancer with lumpectomy and SLNB (0/7) on 10/11/2021  WEIGHT BEARING RESTRICTIONS No  FALLS:  Has patient fallen in last 6 months? Yes. 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  OBJECTIVE:   DIAGNOSTIC FINDINGS:  Left shoulder radiograph on 06/06/2022: FINDINGS: Minimal glenohumeral joint space narrowing. Minimal inferior glenoid degenerative osteophytosis. Normal alignment of the acromioclavicular joint. No acute fracture or dislocation. The visualized portion of the left lung is unremarkable.  Right shoulder radiograph on 06/06/2022: FINDINGS: Mild acromioclavicular joint space narrowing. The glenohumeral joint space is maintained. No acute fracture or dislocation. Right axillary surgical clips are noted.   PATIENT SURVEYS:  Quick Dash 79.55%   COGNITION: Overall cognitive status: Within functional limits for tasks assessed   SENSATION: Numbness and tingling down left arm  POSTURE: forward head, anterior pelvic tilt, and weight shift right OBSERVATION:  R breast approximately 25% larger than left with increased pore size noted  PALPATION: Muscle spasms noted down bilat cervical region.  Some tenderness to palpation along right arm and shoulder   CERVICAL ROM:   Active ROM A/ROM (deg) eval  Flexion 15  Extension 30  Right lateral flexion 30  Left lateral flexion 30  Right rotation 36  Left rotation 33   (Blank rows = not tested)  UPPER EXTREMITY A/ROM: 08/14/2022: R elbow missing 60 degrees from full ext R shoulder flexion 63 degrees, abduction 70 degrees Left shoulder flexion 112 degrees, abduction 125 degrees  UPPER EXTREMITY MMT:  MMT Right eval Left eval  Shoulder flexion 2 4  Shoulder extension 2 4  Shoulder abduction 2 4  Shoulder adduction    Shoulder extension    Shoulder internal rotation 2 4  Shoulder external rotation 2 4  Middle trapezius    Lower trapezius    Elbow flexion    Elbow extension    Wrist flexion    Wrist extension    Wrist ulnar deviation    Wrist radial deviation    Wrist pronation    Wrist supination    Grip strength     (Blank rows = not tested)   FUNCTIONAL TESTS:  08/14/2022: 5 times sit to stand: 43.3 sec with increased thoracic and low back pain   TODAY'S TREATMENT:  09/01/22  In supine: Short neck, 5 diaphragmatic breaths, L axillary nodes and establishment of interaxillary pathway, R inguinal nodes and establishment of axilloinguinal pathway, then R breast moving fluid towards pathways spending extra time in any areas of fibrosis then retracing all steps while educating pt throughout in basic techniques of self MLD. Created foam chip pack for pt to wear in her bra to help increase compression and also cut 1/2 inch grey foam and placed in TG soft for pt to wear in her bra. Issued info for pt to obtain a prairie hugger bra from [employer].   Date: 08/20/22 Cervical A/ROM 3 ways: 3x20 seconds- guarded with all movement Seated scapular retraction x10 Seated thoracic  rotation  Supine cervical rotation x3 each  Chest press in supine with cane x 5- partial ROM with Rt UE due to elbow contracture   08/14/2022:  Issued and reviewed HEP (see below) Check all possible CPT codes: 97164 - PT Re-evaluation, 97110- Therapeutic Exercise, [phone]- Neuro Re-education, 97140 - Manual Therapy, 97530 - Therapeutic Activities, 97535 - Self Care, [phone] - Electrical stimulation (unattended), [phone] - Electrical stimulation (Manual), [id number] - Iontophoresis, and [id number] - Aquatic therapy    Check all conditions that are expected to impact treatment: Musculoskeletal disorders   If treatment provided at initial evaluation, no treatment charged due to lack of authorization.        PATIENT EDUCATION:  Education details: Pt issued HEP Person educated: Patient Education method: Explanation, Demonstration, and Handouts Education comprehension: verbalized understanding   HOME EXERCISE PROGRAM: Access Code: [email or web address] URL: [email or web address] Date: 08/14/2022 Prepared by: Shelby Dubin Menke  Exercises - Seated Cervical Flexion AROM  - 3 x daily - 7 x weekly - 1 sets - 3 reps - 20 hold - Seated Cervical Extension AROM  - 1 x daily - 7 x weekly - 2 sets - 10 reps - Seated Cervical Rotation AROM  - 3 x daily - 7 x weekly - 1 sets - 3 reps - 20 hold - Seated Cervical Retraction  - 1 x daily - 7 x weekly - 2 sets - 10 reps - Seated Shoulder Circles  - 1 x daily - 7 x weekly - 3 sets - 10 reps - Seated Scapular Retraction  - 1 x daily - 7 x weekly - 2 sets - 10 reps - Seated Shoulder Flexion Towel Slide at Table Top  - 1 x daily - 7 x weekly - 2 sets - 10 reps  ASSESSMENT:  CLINICAL IMPRESSION: Updated POC to include lymphedema of R breast. Pt's R breast is approximately 25% larger than her left. She has increased pore size and feels very uncomfortable without her compression bra. She also has discomfort all along her serratus anterior and would benefit from soft tissue  mobilization to this area. Began MLD to R breast today and beginning to instruct pt in correct technique. Pt would benefit from additional skilled PT services to decrease R breast edema, decrease neck pain, improve ROM and posture.    OBJECTIVE IMPAIRMENTS decreased ROM, decreased strength, increased edema, impaired perceived functional ability, increased muscle spasms, impaired flexibility, impaired UE functional use, postural dysfunction, and pain.   ACTIVITY LIMITATIONS carrying, lifting, transfers, dressing, and reach over head  PARTICIPATION LIMITATIONS: cleaning, laundry, driving, community activity, and occupation  PERSONAL FACTORS Past/current experiences, Time since onset of injury/illness/exacerbation, and 3+ comorbidities: breast cancer, R frozen shoulder, s/p cervical surgery  are also affecting patient's functional outcome.   REHAB POTENTIAL: Good  CLINICAL DECISION MAKING: Evolving/moderate complexity  EVALUATION COMPLEXITY: Moderate   GOALS: Goals reviewed with patient? Yes  SHORT TERM GOALS: Target date: 09/04/2022   Pt will be independent with initial HEP. Baseline:  Goal status: INITIAL  2.  Verbalize and demonstrate postural modifications for neutral alignment for reduced cervical strain Baseline:  Goal status: INITIAL  3.  Report 25% fewer sleep interruptions due to pain Baseline:  Goal status: INITIAL    LONG TERM GOALS: Target date: 10/10/2022  Pt will be independent with advanced HEP. Baseline:  Goal status: INITIAL  2.  Patient will improve DASH score to 60% or less to demonstrate improvements in functional mobility. Baseline: 79.55% Goal status: INITIAL  3.  Increase right shoulder flexion and abduction to at least 90 degrees to allow pt to have more use of her UE. Baseline: R flexion 63 deg, R abduction 70 deg  Goal status: INITIAL  4.  Pt will increase cervical A/ROM by at least 10 degrees to allow pt to drive with improved ease. Baseline:  see chart Goal status: INITIAL  5.  Pt will improve 5 times sit to/from stand to 25 sec or less to demonstrate improved functional strength. Baseline: 43.3 sec Goal status: INITIAL  6.  Report > or = to 70% reduction in the frequency and intensity of neck/back pain and Lt UE radiculopathy to facilitate return to work Baseline:  Goal status: INITIAL  7. Pt will obtain an appropriate compression bra for long term management of lymphedema.  Goal status: INITIAL  8. Pt will be independent in self MLD for long term management of lymphedema.   Goal status: INITIAL   PLAN: PT FREQUENCY: 2x/week  PT DURATION: 8 weeks  PLANNED INTERVENTIONS: Therapeutic exercises, Therapeutic activity, Neuromuscular re-education, Balance training, Gait training, Patient/Family education, Self Care, Joint mobilization, Joint manipulation, Stair training, Aquatic Therapy, Dry Needling, Electrical stimulation, Spinal manipulation, Spinal mobilization, Cryotherapy, Moist heat, Manual lymph drainage, Compression bandaging, scar mobilization, Splintting, Taping, Ionotophoresis 97m/ml Dexamethasone, Manual therapy, and Re-evaluation  PLAN FOR NEXT SESSION: encourage mobility and reduce pt guarding, UE A/ROM, thoracic mobility, postural strength, instruct pt in self MLD and give handout, did she get compression bra?  BAllyson SabalBGreat Notch PT 09/01/22 12:58 PM   PHYSICAL THERAPY DISCHARGE SUMMARY  As of 09/25/2022, pt has not shown for any further appointments with multiple "no-shows."  Patient discharged from PT at this time due to attendance policy.  Patient agrees to discharge. Patient goals were not met. Patient is being discharged due to not returning since the last visit.  SJuel Burrow PT 09/25/22 11:21 AM      [facility] [address] [place] [address]Phone # [phone] [phone]

## 2022-09-03 DIAGNOSIS — C50911 Malignant neoplasm of unspecified site of right female breast: Secondary | ICD-10-CM | POA: Diagnosis not present

## 2022-09-03 DIAGNOSIS — N939 Abnormal uterine and vaginal bleeding, unspecified: Secondary | ICD-10-CM | POA: Diagnosis not present

## 2022-09-03 DIAGNOSIS — D259 Leiomyoma of uterus, unspecified: Secondary | ICD-10-CM | POA: Diagnosis not present

## 2022-09-04 ENCOUNTER — Ambulatory Visit: Payer: BC Managed Care – PPO

## 2022-09-08 ENCOUNTER — Ambulatory Visit: Payer: BC Managed Care – PPO | Admitting: Physical Therapy

## 2022-09-08 DIAGNOSIS — D259 Leiomyoma of uterus, unspecified: Secondary | ICD-10-CM | POA: Diagnosis not present

## 2022-09-08 DIAGNOSIS — R9389 Abnormal findings on diagnostic imaging of other specified body structures: Secondary | ICD-10-CM | POA: Diagnosis not present

## 2022-09-08 DIAGNOSIS — N939 Abnormal uterine and vaginal bleeding, unspecified: Secondary | ICD-10-CM | POA: Diagnosis not present

## 2022-09-09 ENCOUNTER — Other Ambulatory Visit: Payer: BC Managed Care – PPO

## 2022-09-09 ENCOUNTER — Ambulatory Visit: Payer: BC Managed Care – PPO | Admitting: Psychologist

## 2022-09-11 ENCOUNTER — Ambulatory Visit: Payer: BC Managed Care – PPO | Admitting: Rehabilitative and Restorative Service Providers"

## 2022-09-15 ENCOUNTER — Ambulatory Visit: Payer: BC Managed Care – PPO | Admitting: Physical Therapy

## 2022-09-17 ENCOUNTER — Ambulatory Visit: Payer: BC Managed Care – PPO | Admitting: Rehabilitative and Restorative Service Providers"

## 2022-09-17 ENCOUNTER — Telehealth: Payer: Self-pay | Admitting: Rehabilitative and Restorative Service Providers"

## 2022-09-17 NOTE — Telephone Encounter (Signed)
Called pt secondary to not showing for her scheduled appointment.  Left a message to notify that due to Missed Visit policy, if pt does not come for her next scheduled appointment, all remaining appointments will be cancelled and pt discharged.  Reminded pt of date/time for next scheduled PT visit.

## 2022-09-21 ENCOUNTER — Other Ambulatory Visit: Payer: Self-pay | Admitting: Hematology

## 2022-09-22 ENCOUNTER — Other Ambulatory Visit (HOSPITAL_BASED_OUTPATIENT_CLINIC_OR_DEPARTMENT_OTHER): Payer: Self-pay | Admitting: Orthopaedic Surgery

## 2022-09-22 ENCOUNTER — Telehealth: Payer: Self-pay | Admitting: Orthopaedic Surgery

## 2022-09-22 ENCOUNTER — Ambulatory Visit: Payer: BC Managed Care – PPO | Admitting: Physical Therapy

## 2022-09-22 NOTE — Telephone Encounter (Signed)
Pt called and need a call back from Management. Pt is highly upset and have left several messages to Gabriel Cirri about her MRI being canceled 4 times due to no pre auth being sent. Spoke with Gabriel Cirri and she states [facility] never sent that they needed a pre auth for this patient. Pt states she has left several messages on MRI referral voicemail and no call backs. Pt states her insurance company gave approval several times and when appt time comes they call and cancel due to no pre auth. Pt states she has been off work and used all of her FMLA to find out what is going on with her issue and she hasn't gotten anything done due to these MRI appts being canceled. Pt is asking to speak to a supervisor or someone in upper management. I transferred call to [facility]. Pt asking for a call from [place] or Ander Purpura F about this issue. Pt phone number is [phone].

## 2022-09-22 NOTE — Telephone Encounter (Signed)
Patient called in frustrated because her MRI was yet cancelled again being reason that there was no pre Auth from her insurance, patient stated she called insurance and they stated she was covered and good to go to get her MRI yet it has been rescheduled 4 times and she is not understanding why and once she gets it scheduled the [place] period runs out. So she is needing a new order for a MRI put in, please advise

## 2022-09-23 ENCOUNTER — Ambulatory Visit
Admission: RE | Admit: 2022-09-23 | Discharge: 2022-09-23 | Disposition: A | Discharge status: Home / Self Care | Payer: BC Managed Care – PPO | Source: Ambulatory Visit | Attending: Nurse Practitioner | Admitting: Nurse Practitioner

## 2022-09-23 ENCOUNTER — Other Ambulatory Visit (HOSPITAL_BASED_OUTPATIENT_CLINIC_OR_DEPARTMENT_OTHER): Payer: Self-pay | Admitting: Orthopaedic Surgery

## 2022-09-23 DIAGNOSIS — Z853 Personal history of malignant neoplasm of breast: Secondary | ICD-10-CM | POA: Diagnosis not present

## 2022-09-23 DIAGNOSIS — R92323 Mammographic fibroglandular density, bilateral breasts: Secondary | ICD-10-CM | POA: Diagnosis not present

## 2022-09-23 DIAGNOSIS — Z170 Estrogen receptor positive status [ER+]: Secondary | ICD-10-CM

## 2022-09-23 HISTORY — DX: Personal history of irradiation: Z92.3

## 2022-09-24 NOTE — Telephone Encounter (Signed)
In my defense, I have contacted pt back several times in regarding this and THERE has not been any call backs. [facility] had called to get pt scheduled BEFORE her exp ran out on the PA's with no call back.[facility] called 1 day prior to her appt to let me it will expire, per [employer] I will have to go in AFTER expiration and resubmit.  I can not help if pt does not call back to get scheduled. I check my messages twice a day, I went back in today to pt insurance [employer] to resubmit PA and now it is in Progress/ medical review.

## 2022-09-25 ENCOUNTER — Ambulatory Visit: Payer: BC Managed Care – PPO | Admitting: Rehabilitative and Restorative Service Providers"

## 2022-09-25 DIAGNOSIS — M5412 Radiculopathy, cervical region: Secondary | ICD-10-CM | POA: Diagnosis not present

## 2022-09-25 NOTE — Telephone Encounter (Signed)
Pt calling in stating that she been waiting on someone to call her to schedule her MRI appt... Pt stated that she have been calling [facility] but no one return her calls... Pt is requesting callback

## 2022-09-29 ENCOUNTER — Ambulatory Visit: Payer: BC Managed Care – PPO | Admitting: Physical Therapy

## 2022-09-29 NOTE — H&P (Signed)
Emily Montoya is an 51 y.o. No obstetric history on file. who is admitted for ***.  Patient Active Problem List   Diagnosis Date Noted  . Genetic testing 09/23/2021  . Malignant neoplasm of upper-inner quadrant of right breast in female, estrogen receptor positive ([place]) 08/29/2021    Pertinent Gynecological History: Menses: {menses:16152} Bleeding: {uterine bleeding:32112} Contraception: {contraception:5051} Sexually transmitted diseases: {std risk:32110} Previous GYN Procedures: {previous procedures:3041388}  Last mammogram: {normal/abnormal***:32111} Date: *** Last pap: {normal/abnormal***:32111} Date: *** OB History: No obstetric history on file.   MEDICAL/FAMILY/SOCIAL HX: No LMP recorded.    Past Medical History:  Diagnosis Date  . Breast cancer ([place]) 08/20/2021  . Personal history of radiation therapy   . Sickle cell trait [facility])     Past Surgical History:  Procedure Laterality Date  . BREAST BIOPSY Right 07/2021  . BREAST LUMPECTOMY Right 10/11/2021  . BREAST LUMPECTOMY WITH RADIOACTIVE SEED AND SENTINEL LYMPH NODE BIOPSY Right 10/11/2021   Procedure: RIGHT BREAST LUMPECTOMY WITH RADIOACTIVE SEED LOCALIZATION AND SENTINEL LYMPH NODE BIOPSY;  Surgeon: [name], MD;  Location: [place];  Service: General;  Laterality: Right;  . NECK SURGERY    . TUBAL LIGATION      Family History  Problem Relation Age of Onset  . HIV/AIDS Mother   . Cancer Maternal Grandmother        Throat    Social History:  reports that she quit smoking about 13 months ago. Her smoking use included cigarettes. She has a 3.00 pack-year smoking history. She has never used smokeless tobacco. She reports current alcohol use. She reports that she does not use drugs.  ALLERGIES/MEDS:  Allergies:  Allergies  Allergen Reactions  . Meloxicam Swelling    No medications prior to admission.     ROS  There were no vitals taken for this visit. Physical Exam  No  results found for this or any previous visit (from the past 24 hour(s)).  No results found.   ASSESSMENT/PLAN: Emily Montoya is a 51 y.o. No obstetric history on file. who is admitted for ***   Drema Dallas, DO

## 2022-09-30 ENCOUNTER — Other Ambulatory Visit: Payer: Self-pay

## 2022-09-30 ENCOUNTER — Encounter (HOSPITAL_BASED_OUTPATIENT_CLINIC_OR_DEPARTMENT_OTHER): Payer: Self-pay | Admitting: Obstetrics and Gynecology

## 2022-09-30 DIAGNOSIS — N939 Abnormal uterine and vaginal bleeding, unspecified: Secondary | ICD-10-CM

## 2022-09-30 DIAGNOSIS — Z01818 Encounter for other preprocedural examination: Secondary | ICD-10-CM | POA: Diagnosis not present

## 2022-09-30 DIAGNOSIS — D219 Benign neoplasm of connective and other soft tissue, unspecified: Secondary | ICD-10-CM | POA: Insufficient documentation

## 2022-09-30 NOTE — Progress Notes (Addendum)
Spoke w/ via phone for pre-op interview---[facility] needs dos----urine pregnancy               Lab results------10/06/22 lab appt for cbc, type & screen, 06/27/22 EKG in chart & Epic, 06/27/22 chest xray in Epic (pt had post-operative pneumonia at time of chest xray) COVID test -----patient states asymptomatic no test needed Arrive at -------0630 on Wednesday 10/08/22 NPO after MN NO Solid Food.  Clear liquids from MN until---0530 Med rec completed Medications to take morning of surgery -----Neurontin, Tamoxifen, Effexor Diabetic medication -----n/a Patient instructed no nail polish to be worn day of surgery Patient instructed to bring photo id and insurance card day of surgery Patient aware to have Driver (ride ) / caregiver    for 24 hours after surgery - Vanessa Ralphs Patient Special Instructions -----Extended / overnight stay instructions given. Pre-Op special Istructions -----none Patient verbalized understanding of instructions that were given at this phone interview. Patient denies shortness of breath, chest pain, fever, cough at this phone interview.

## 2022-09-30 NOTE — Progress Notes (Signed)
Your procedure is scheduled on Wednesday, 10/08/22.  Report to [facility].   Call this number if you have problems the morning of surgery  :[phone].   OUR ADDRESS IS [address].  WE ARE LOCATED IN [facility].  PLEASE BRING YOUR INSURANCE CARD AND PHOTO ID DAY OF SURGERY.  ONLY 2 PEOPLE ARE ALLOWED IN  WAITING  ROOM.                                      REMEMBER:  DO NOT EAT FOOD, CANDY GUM OR MINTS  AFTER MIDNIGHT THE NIGHT BEFORE YOUR SURGERY . YOU MAY HAVE CLEAR LIQUIDS FROM MIDNIGHT THE NIGHT BEFORE YOUR SURGERY UNTIL  5:30 . NO CLEAR LIQUIDS AFTER   5:30 AM DAY OF SURGERY.  YOU MAY  BRUSH YOUR TEETH MORNING OF SURGERY AND RINSE YOUR MOUTH OUT, NO CHEWING GUM CANDY OR MINTS.     CLEAR LIQUID DIET   Foods Allowed                                                                     Foods Excluded  Coffee and tea, regular and decaf                             liquids that you cannot  Plain Jell-O                                                                   see through such as: Fruit ices (not with fruit pulp)                                     milk, soups, orange juice  Plain  Popsicles                                    All solid food Carbonated beverages, regular and diet                                    Cranberry, grape and apple juices Sports drinks like Gatorade _____________________________________________________________________     TAKE ONLY THESE MEDICATIONS MORNING OF SURGERY:  Neurontin, Tamoxifen, Effexor    UP TO 4 VISITORS  MAY VISIT IN THE EXTENDED RECOVERY ROOM UNTIL 800 PM ONLY.  ONE  VISITOR AGE 50 AND OVER MAY SPEND THE NIGHT AND MUST BE IN EXTENDED RECOVERY ROOM NO LATER THAN 800 PM . YOUR DISCHARGE TIME AFTER YOU SPEND THE NIGHT IS 900 AM THE MORNING AFTER YOUR SURGERY.  YOU MAY PACK A SMALL OVERNIGHT BAG WITH TOILETRIES FOR  YOUR OVERNIGHT STAY IF YOU WISH.  YOUR PRESCRIPTION  MEDICATIONS WILL BE PROVIDED DURING [place].                                      DO NOT WEAR JEWERLY, MAKE UP. DO NOT WEAR LOTIONS, POWDERS, PERFUMES OR NAIL POLISH ON YOUR FINGERNAILS. TOENAIL POLISH IS OK TO WEAR. DO NOT SHAVE FOR 48 HOURS PRIOR TO DAY OF SURGERY. MEN MAY SHAVE FACE AND NECK. CONTACTS, GLASSES, OR DENTURES MAY NOT BE WORN TO SURGERY.  REMEMBER: NO SMOKING, DRUGS OR ALCOHOL FOR 24 HOURS BEFORE YOUR SURGERY.                                    [facility] IS NOT RESPONSIBLE  FOR ANY BELONGINGS.                                                                    Marland Kitchen           [facility] - Preparing for Surgery Before surgery, you can play an important role.  Because skin is not sterile, your skin needs to be as free of germs as possible.  You can reduce the number of germs on your skin by washing with CHG (chlorahexidine gluconate) soap before surgery.  CHG is an antiseptic cleaner which kills germs and bonds with the skin to continue killing germs even after washing. Please DO NOT use if you have an allergy to CHG or antibacterial soaps.  If your skin becomes reddened/irritated stop using the CHG and inform your nurse when you arrive at Short Stay. Do not shave (including legs and underarms) for at least 48 hours prior to the first CHG shower.  You may shave your face/neck. Please follow these instructions carefully:  1.  Shower with CHG Soap the night before surgery and the  morning of Surgery.  2.  If you choose to wash your hair, wash your hair first as usual with your  normal  shampoo.  3.  After you shampoo, rinse your hair and body thoroughly to remove the  shampoo.                                        4.  Use CHG as you would any other liquid soap.  You can apply chg directly  to the skin and wash , chg soap provided, night before and morning of your surgery.  5.  Apply the CHG Soap to your body ONLY FROM THE NECK DOWN.   Do not use on face/ open                            Wound or open sores. Avoid contact with eyes, ears mouth and genitals (private parts).                       Wash face,  Genitals (private parts) with your normal  soap.             6.  Wash thoroughly, paying special attention to the area where your surgery  will be performed.  7.  Thoroughly rinse your body with warm water from the neck down.  8.  DO NOT shower/wash with your normal soap after using and rinsing off  the CHG Soap.             9.  Pat yourself dry with a clean towel.            10.  Wear clean pajamas.            11.  Place clean sheets on your bed the night of your first shower and do not  sleep with pets. Day of Surgery : Do not apply any lotions/deodorants the morning of surgery.  Please wear clean clothes to the hospital/surgery center.  IF YOU HAVE ANY SKIN IRRITATION OR PROBLEMS WITH THE SURGICAL SOAP, PLEASE GET A BAR OF GOLD DIAL SOAP AND SHOWER THE NIGHT BEFORE YOUR SURGERY AND THE MORNING OF YOUR SURGERY. PLEASE LET THE NURSE KNOW MORNING OF YOUR SURGERY IF YOU HAD ANY PROBLEMS WITH THE SURGICAL SOAP.   ________________________________________________________________________                                                        QUESTIONS [facility] PRE OP NURSE PHONE [phone].

## 2022-10-02 ENCOUNTER — Encounter: Payer: BC Managed Care – PPO | Admitting: Rehabilitative and Restorative Service Providers"

## 2022-10-06 ENCOUNTER — Encounter: Payer: BC Managed Care – PPO | Admitting: Physical Therapy

## 2022-10-06 ENCOUNTER — Encounter (HOSPITAL_COMMUNITY)
Admission: RE | Admit: 2022-10-06 | Discharge: 2022-10-06 | Disposition: A | Discharge status: Home / Self Care | Payer: BC Managed Care – PPO | Source: Ambulatory Visit | Attending: Obstetrics and Gynecology | Admitting: Obstetrics and Gynecology

## 2022-10-06 DIAGNOSIS — N939 Abnormal uterine and vaginal bleeding, unspecified: Secondary | ICD-10-CM | POA: Diagnosis not present

## 2022-10-06 DIAGNOSIS — Z01812 Encounter for preprocedural laboratory examination: Secondary | ICD-10-CM | POA: Diagnosis not present

## 2022-10-06 LAB — CBC
HCT: 37.1 % (ref 36.0–46.0)
Hemoglobin: 11.5 g/dL — ABNORMAL LOW (ref 12.0–15.0)
MCH: 26 pg (ref 26.0–34.0)
MCHC: 31 g/dL (ref 30.0–36.0)
MCV: 83.9 fL (ref 80.0–100.0)
Platelets: 297 10*3/uL (ref 150–400)
RBC: 4.42 MIL/uL (ref 3.87–5.11)
RDW: 13.6 % (ref 11.5–15.5)
WBC: 5.7 10*3/uL (ref 4.0–10.5)
nRBC: 0 % (ref 0.0–0.2)

## 2022-10-07 NOTE — Anesthesia Preprocedure Evaluation (Addendum)
Anesthesia Evaluation  Patient identified by MRN, date of birth, ID band Patient awake    Reviewed: Allergy & Precautions, H&P , NPO status , Patient's Chart, lab work & pertinent test results  Airway Mallampati: II  TM Distance: >3 FB Neck ROM: Full    Dental no notable dental hx. (+) Partial Lower   Pulmonary neg pulmonary ROS, former smoker   Pulmonary exam normal breath sounds clear to auscultation       Cardiovascular negative cardio ROS Normal cardiovascular exam Rhythm:Regular Rate:Normal     Neuro/Psych   Anxiety      Neuromuscular disease negative neurological ROS  negative psych ROS   GI/Hepatic negative GI ROS, Neg liver ROS,,,  Endo/Other  negative endocrine ROS    Renal/GU negative Renal ROS  negative genitourinary   Musculoskeletal negative musculoskeletal ROS (+)    Abdominal   Peds negative pediatric ROS (+)  Hematology negative hematology ROS (+) Blood dyscrasia, Sickle cell trait Lab Results      Component                Value               Date                            HGB                      11.5 (L)            10/06/2022                HCT                      37.1                10/06/2022               PLT                      297                 10/06/2022              Anesthesia Other Findings All: Meloxicam  Breast Ca S/p Rads  Reproductive/Obstetrics negative OB ROS                             Anesthesia Physical Anesthesia Plan  ASA: 3  Anesthesia Plan: General   Post-op Pain Management: Ketamine IV*, Lidocaine infusion* and Ofirmev IV (intra-op)*   Induction: Intravenous  PONV Risk Score and Plan: 4 or greater and Treatment may vary due to age or medical condition, Midazolam, Dexamethasone and Ondansetron  Airway Management Planned: Oral ETT  Additional Equipment: None  Intra-op Plan:   Post-operative Plan: Extubation in OR  Informed  Consent: I have reviewed the patients History and Physical, chart, labs and discussed the procedure including the risks, benefits and alternatives for the proposed anesthesia with the patient or authorized representative who has indicated his/her understanding and acceptance.     Dental advisory given  Plan Discussed with: CRNA, Anesthesiologist and Surgeon  Anesthesia Plan Comments:        Anesthesia Quick Evaluation

## 2022-10-08 ENCOUNTER — Other Ambulatory Visit: Payer: Self-pay

## 2022-10-08 ENCOUNTER — Encounter (HOSPITAL_BASED_OUTPATIENT_CLINIC_OR_DEPARTMENT_OTHER)
Admission: RE | Disposition: A | Discharge status: Home / Self Care | Payer: Self-pay | Source: Home / Self Care | Attending: Obstetrics and Gynecology

## 2022-10-08 ENCOUNTER — Ambulatory Visit (HOSPITAL_BASED_OUTPATIENT_CLINIC_OR_DEPARTMENT_OTHER)
Admission: RE | Admit: 2022-10-08 | Discharge: 2022-10-09 | Disposition: A | Discharge status: Home / Self Care | Payer: BC Managed Care – PPO | Attending: Obstetrics and Gynecology | Admitting: Obstetrics and Gynecology

## 2022-10-08 ENCOUNTER — Ambulatory Visit (HOSPITAL_BASED_OUTPATIENT_CLINIC_OR_DEPARTMENT_OTHER): Payer: BC Managed Care – PPO | Admitting: Anesthesiology

## 2022-10-08 ENCOUNTER — Encounter (HOSPITAL_BASED_OUTPATIENT_CLINIC_OR_DEPARTMENT_OTHER): Payer: Self-pay | Admitting: Obstetrics and Gynecology

## 2022-10-08 DIAGNOSIS — N83209 Unspecified ovarian cyst, unspecified side: Secondary | ICD-10-CM | POA: Diagnosis not present

## 2022-10-08 DIAGNOSIS — D259 Leiomyoma of uterus, unspecified: Secondary | ICD-10-CM | POA: Insufficient documentation

## 2022-10-08 DIAGNOSIS — F419 Anxiety disorder, unspecified: Secondary | ICD-10-CM | POA: Diagnosis not present

## 2022-10-08 DIAGNOSIS — Z853 Personal history of malignant neoplasm of breast: Secondary | ICD-10-CM | POA: Diagnosis not present

## 2022-10-08 DIAGNOSIS — N858 Other specified noninflammatory disorders of uterus: Secondary | ICD-10-CM | POA: Diagnosis not present

## 2022-10-08 DIAGNOSIS — Z87891 Personal history of nicotine dependence: Secondary | ICD-10-CM | POA: Insufficient documentation

## 2022-10-08 DIAGNOSIS — D573 Sickle-cell trait: Secondary | ICD-10-CM | POA: Diagnosis not present

## 2022-10-08 DIAGNOSIS — D219 Benign neoplasm of connective and other soft tissue, unspecified: Secondary | ICD-10-CM | POA: Insufficient documentation

## 2022-10-08 DIAGNOSIS — N939 Abnormal uterine and vaginal bleeding, unspecified: Secondary | ICD-10-CM | POA: Diagnosis not present

## 2022-10-08 DIAGNOSIS — N83201 Unspecified ovarian cyst, right side: Secondary | ICD-10-CM | POA: Diagnosis not present

## 2022-10-08 DIAGNOSIS — Z01818 Encounter for other preprocedural examination: Secondary | ICD-10-CM

## 2022-10-08 DIAGNOSIS — N83202 Unspecified ovarian cyst, left side: Secondary | ICD-10-CM | POA: Diagnosis not present

## 2022-10-08 DIAGNOSIS — K668 Other specified disorders of peritoneum: Secondary | ICD-10-CM | POA: Diagnosis not present

## 2022-10-08 HISTORY — DX: Presence of spectacles and contact lenses: Z97.3

## 2022-10-08 HISTORY — DX: Cardiac murmur, unspecified: R01.1

## 2022-10-08 HISTORY — DX: Pain in right shoulder: M25.511

## 2022-10-08 HISTORY — DX: Unspecified osteoarthritis, unspecified site: M19.90

## 2022-10-08 HISTORY — PX: ROBOTIC ASSISTED LAPAROSCOPIC HYSTERECTOMY AND SALPINGECTOMY: SHX6379

## 2022-10-08 LAB — ABO/RH: ABO/RH(D): O POS

## 2022-10-08 LAB — TYPE AND SCREEN
ABO/RH(D): O POS
Antibody Screen: NEGATIVE

## 2022-10-08 LAB — POCT PREGNANCY, URINE: Preg Test, Ur: NEGATIVE

## 2022-10-08 SURGERY — XI ROBOTIC ASSISTED LAPAROSCOPIC HYSTERECTOMY AND SALPINGECTOMY
Anesthesia: General | Site: Abdomen | Laterality: Bilateral

## 2022-10-08 MED ORDER — HEMOSTATIC AGENTS (NO CHARGE) OPTIME
TOPICAL | Status: DC | PRN
  Administered 2022-10-08: 1

## 2022-10-08 MED ORDER — MENTHOL 3 MG MT LOZG: 1.0000 | LOZENGE | OROMUCOSAL | Status: DC | PRN

## 2022-10-08 MED ORDER — ROCURONIUM BROMIDE 10 MG/ML (PF) SYRINGE
PREFILLED_SYRINGE | INTRAVENOUS | Status: AC
  Filled 2022-10-08: qty 10

## 2022-10-08 MED ORDER — PHENYLEPHRINE 80 MCG/ML (10ML) SYRINGE FOR IV PUSH (FOR BLOOD PRESSURE SUPPORT)
PREFILLED_SYRINGE | INTRAVENOUS | Status: AC
  Filled 2022-10-08: qty 10

## 2022-10-08 MED ORDER — PHENYLEPHRINE HCL (PRESSORS) 10 MG/ML IV SOLN
INTRAVENOUS | Status: DC | PRN
  Administered 2022-10-08 (×4): 80 ug via INTRAVENOUS

## 2022-10-08 MED ORDER — LIDOCAINE HCL (CARDIAC) PF 100 MG/5ML IV SOSY
PREFILLED_SYRINGE | INTRAVENOUS | Status: DC | PRN
  Administered 2022-10-08: 40 mg via INTRAVENOUS
  Administered 2022-10-08: 60 mg via INTRAVENOUS

## 2022-10-08 MED ORDER — ONDANSETRON HCL 4 MG/2ML IJ SOLN: 4.0000 mg | Freq: Four times a day (QID) | INTRAMUSCULAR | Status: DC | PRN

## 2022-10-08 MED ORDER — KETOROLAC TROMETHAMINE 30 MG/ML IJ SOLN: 30.0000 mg | Freq: Once | INTRAMUSCULAR | Status: DC | PRN

## 2022-10-08 MED ORDER — IBUPROFEN 200 MG PO TABS
ORAL_TABLET | ORAL | Status: AC
  Filled 2022-10-08: qty 3

## 2022-10-08 MED ORDER — OXYCODONE HCL 5 MG PO TABS
5.0000 mg | ORAL_TABLET | ORAL | Status: DC | PRN
  Administered 2022-10-08 – 2022-10-09 (×4): 10 mg via ORAL

## 2022-10-08 MED ORDER — SODIUM CHLORIDE 0.9 % IR SOLN
Status: DC | PRN
  Administered 2022-10-08: 1000 mL

## 2022-10-08 MED ORDER — FENTANYL CITRATE (PF) 250 MCG/5ML IJ SOLN
INTRAMUSCULAR | Status: AC
  Filled 2022-10-08: qty 5

## 2022-10-08 MED ORDER — OXYCODONE HCL 5 MG PO TABS: 5.0000 mg | ORAL_TABLET | Freq: Once | ORAL | Status: DC | PRN

## 2022-10-08 MED ORDER — PROPOFOL 10 MG/ML IV BOLUS
INTRAVENOUS | Status: DC | PRN
  Administered 2022-10-08: 160 mg via INTRAVENOUS

## 2022-10-08 MED ORDER — DOCUSATE SODIUM 100 MG PO CAPS
ORAL_CAPSULE | ORAL | Status: AC
  Filled 2022-10-08: qty 1

## 2022-10-08 MED ORDER — OXYCODONE HCL 5 MG/5ML PO SOLN: 5.0000 mg | Freq: Once | ORAL | Status: DC | PRN

## 2022-10-08 MED ORDER — ACETAMINOPHEN 500 MG PO TABS
1000.0000 mg | ORAL_TABLET | Freq: Four times a day (QID) | ORAL | Status: DC
  Administered 2022-10-08 – 2022-10-09 (×4): 1000 mg via ORAL

## 2022-10-08 MED ORDER — TAMOXIFEN CITRATE 10 MG PO TABS
20.0000 mg | ORAL_TABLET | Freq: Every day | ORAL | Status: DC
  Administered 2022-10-08: 20 mg via ORAL
  Filled 2022-10-08: qty 2

## 2022-10-08 MED ORDER — HYDROMORPHONE HCL 1 MG/ML IJ SOLN
0.2500 mg | INTRAMUSCULAR | Status: DC | PRN
  Administered 2022-10-08: 0.25 mg via INTRAVENOUS

## 2022-10-08 MED ORDER — LIDOCAINE IN D5W 4-5 MG/ML-% IV SOLN
INTRAVENOUS | Status: DC | PRN
  Administered 2022-10-08: 1.5 ug/kg/min via INTRAVENOUS

## 2022-10-08 MED ORDER — OXYCODONE HCL 5 MG PO TABS: 5.0000 mg | ORAL_TABLET | Freq: Four times a day (QID) | ORAL | 0 refills | Status: DC | PRN

## 2022-10-08 MED ORDER — KETOROLAC TROMETHAMINE 30 MG/ML IJ SOLN
INTRAMUSCULAR | Status: DC | PRN
  Administered 2022-10-08: 30 mg via INTRAVENOUS

## 2022-10-08 MED ORDER — IBUPROFEN 800 MG PO TABS
ORAL_TABLET | ORAL | Status: AC
  Filled 2022-10-08: qty 1

## 2022-10-08 MED ORDER — KETAMINE HCL 10 MG/ML IJ SOLN
INTRAMUSCULAR | Status: DC | PRN
  Administered 2022-10-08 (×3): 10 mg via INTRAVENOUS

## 2022-10-08 MED ORDER — ONDANSETRON HCL 4 MG PO TABS: 4.0000 mg | ORAL_TABLET | Freq: Four times a day (QID) | ORAL | Status: DC | PRN

## 2022-10-08 MED ORDER — MORPHINE SULFATE (PF) 2 MG/ML IV SOLN
INTRAVENOUS | Status: AC
  Filled 2022-10-08: qty 1

## 2022-10-08 MED ORDER — CEFAZOLIN SODIUM-DEXTROSE 2-4 GM/100ML-% IV SOLN
2.0000 g | INTRAVENOUS | Status: AC
  Administered 2022-10-08: 2 g via INTRAVENOUS

## 2022-10-08 MED ORDER — KETAMINE HCL 50 MG/5ML IJ SOSY
PREFILLED_SYRINGE | INTRAMUSCULAR | Status: AC
  Filled 2022-10-08: qty 5

## 2022-10-08 MED ORDER — ONDANSETRON HCL 4 MG/2ML IJ SOLN: 4.0000 mg | Freq: Once | INTRAMUSCULAR | Status: DC | PRN

## 2022-10-08 MED ORDER — ONDANSETRON HCL 4 MG/2ML IJ SOLN
INTRAMUSCULAR | Status: AC
  Filled 2022-10-08: qty 2

## 2022-10-08 MED ORDER — DOCUSATE SODIUM 100 MG PO CAPS
100.0000 mg | ORAL_CAPSULE | Freq: Two times a day (BID) | ORAL | Status: DC
  Administered 2022-10-08 (×2): 100 mg via ORAL

## 2022-10-08 MED ORDER — EPHEDRINE SULFATE (PRESSORS) 50 MG/ML IJ SOLN
INTRAMUSCULAR | Status: DC | PRN
  Administered 2022-10-08 (×2): 10 mg via INTRAVENOUS

## 2022-10-08 MED ORDER — DEXAMETHASONE SODIUM PHOSPHATE 10 MG/ML IJ SOLN
INTRAMUSCULAR | Status: AC
  Filled 2022-10-08: qty 1

## 2022-10-08 MED ORDER — OXYCODONE HCL 5 MG PO TABS
ORAL_TABLET | ORAL | Status: AC
  Filled 2022-10-08: qty 2

## 2022-10-08 MED ORDER — PROPOFOL 10 MG/ML IV BOLUS
INTRAVENOUS | Status: AC
  Filled 2022-10-08: qty 20

## 2022-10-08 MED ORDER — SUGAMMADEX SODIUM 200 MG/2ML IV SOLN
INTRAVENOUS | Status: DC | PRN
  Administered 2022-10-08: 200 mg via INTRAVENOUS

## 2022-10-08 MED ORDER — SODIUM CHLORIDE 0.9 % IV SOLN
INTRAVENOUS | Status: DC | PRN
  Administered 2022-10-08: 90 mL

## 2022-10-08 MED ORDER — ONDANSETRON HCL 4 MG/2ML IJ SOLN
INTRAMUSCULAR | Status: DC | PRN
  Administered 2022-10-08: 4 mg via INTRAVENOUS

## 2022-10-08 MED ORDER — SIMETHICONE 80 MG PO CHEW: 80.0000 mg | CHEWABLE_TABLET | Freq: Four times a day (QID) | ORAL | Status: DC | PRN

## 2022-10-08 MED ORDER — LACTATED RINGERS IV SOLN: INTRAVENOUS | Status: DC

## 2022-10-08 MED ORDER — MORPHINE SULFATE (PF) 4 MG/ML IV SOLN
1.0000 mg | INTRAVENOUS | Status: DC | PRN
  Administered 2022-10-08 (×2): 2 mg via INTRAVENOUS

## 2022-10-08 MED ORDER — MIDAZOLAM HCL 5 MG/5ML IJ SOLN
INTRAMUSCULAR | Status: DC | PRN
  Administered 2022-10-08: 2 mg via INTRAVENOUS

## 2022-10-08 MED ORDER — LIDOCAINE HCL (PF) 2 % IJ SOLN
INTRAMUSCULAR | Status: AC
  Filled 2022-10-08: qty 5

## 2022-10-08 MED ORDER — DEXAMETHASONE SODIUM PHOSPHATE 4 MG/ML IJ SOLN
INTRAMUSCULAR | Status: DC | PRN
  Administered 2022-10-08: 5 mg via INTRAVENOUS

## 2022-10-08 MED ORDER — IBUPROFEN 600 MG PO TABS: 600.0000 mg | ORAL_TABLET | Freq: Four times a day (QID) | ORAL | 1 refills | Status: DC | PRN

## 2022-10-08 MED ORDER — ROCURONIUM BROMIDE 100 MG/10ML IV SOLN
INTRAVENOUS | Status: DC | PRN
  Administered 2022-10-08 (×3): 10 mg via INTRAVENOUS
  Administered 2022-10-08: 60 mg via INTRAVENOUS

## 2022-10-08 MED ORDER — IBUPROFEN 200 MG PO TABS
600.0000 mg | ORAL_TABLET | Freq: Four times a day (QID) | ORAL | Status: DC
  Administered 2022-10-08: 400 mg via ORAL
  Administered 2022-10-09 (×2): 600 mg via ORAL

## 2022-10-08 MED ORDER — STERILE WATER FOR IRRIGATION IR SOLN
Status: DC | PRN
  Administered 2022-10-08: 500 mL

## 2022-10-08 MED ORDER — HYDROMORPHONE HCL 1 MG/ML IJ SOLN
INTRAMUSCULAR | Status: AC
  Filled 2022-10-08: qty 1

## 2022-10-08 MED ORDER — FENTANYL CITRATE (PF) 100 MCG/2ML IJ SOLN
INTRAMUSCULAR | Status: DC | PRN
  Administered 2022-10-08 (×3): 50 ug via INTRAVENOUS
  Administered 2022-10-08: 100 ug via INTRAVENOUS

## 2022-10-08 MED ORDER — ACETAMINOPHEN 500 MG PO TABS
ORAL_TABLET | ORAL | Status: AC
  Filled 2022-10-08: qty 2

## 2022-10-08 MED ORDER — CEFAZOLIN SODIUM-DEXTROSE 2-4 GM/100ML-% IV SOLN
INTRAVENOUS | Status: AC
  Filled 2022-10-08: qty 100

## 2022-10-08 MED ORDER — MIDAZOLAM HCL 2 MG/2ML IJ SOLN
INTRAMUSCULAR | Status: AC
  Filled 2022-10-08: qty 2

## 2022-10-08 MED ORDER — KETOROLAC TROMETHAMINE 30 MG/ML IJ SOLN
INTRAMUSCULAR | Status: AC
  Filled 2022-10-08: qty 1

## 2022-10-08 SURGICAL SUPPLY — 67 items
ADH SKN CLS APL DERMABOND .7 (GAUZE/BANDAGES/DRESSINGS) ×1
APL ESCP 73.6OZ SRGCL (TIP) ×1
APL SRG 38 LTWT LNG FL B (MISCELLANEOUS) ×1
APL SWBSTK 6 STRL LF DISP (MISCELLANEOUS) ×1
APPLICATOR ARISTA FLEXITIP XL (MISCELLANEOUS) IMPLANT
APPLICATOR COTTON TIP 6 STRL (MISCELLANEOUS) IMPLANT
APPLICATOR COTTON TIP 6IN STRL (MISCELLANEOUS) ×1
BARRIER ADHS 3X4 INTERCEED (GAUZE/BANDAGES/DRESSINGS) IMPLANT
BRR ADH 4X3 ABS CNTRL BYND (GAUZE/BANDAGES/DRESSINGS)
CATH FOLEY 3WAY  5CC 16FR (CATHETERS) ×1
CATH FOLEY 3WAY 5CC 16FR (CATHETERS) ×1 IMPLANT
COVER BACK TABLE 60X90IN (DRAPES) ×1 IMPLANT
COVER TIP SHEARS 8 DVNC (MISCELLANEOUS) ×1 IMPLANT
COVER TIP SHEARS 8MM DA VINCI (MISCELLANEOUS) ×1
DEFOGGER SCOPE WARMER CLEARIFY (MISCELLANEOUS) ×1 IMPLANT
DERMABOND ADVANCED .7 DNX12 (GAUZE/BANDAGES/DRESSINGS) ×1 IMPLANT
DILATOR CANAL MILEX (MISCELLANEOUS) ×1 IMPLANT
DRAPE ARM DVNC X/XI (DISPOSABLE) ×4 IMPLANT
DRAPE COLUMN DVNC XI (DISPOSABLE) ×1 IMPLANT
DRAPE DA VINCI XI ARM (DISPOSABLE) ×4
DRAPE DA VINCI XI COLUMN (DISPOSABLE) ×1
DRAPE UTILITY 15X26 TOWEL STRL (DRAPES) ×1 IMPLANT
DRAPE UTILITY XL STRL (DRAPES) ×1 IMPLANT
DURAPREP 26ML APPLICATOR (WOUND CARE) ×1 IMPLANT
ELECT REM PT RETURN 9FT ADLT (ELECTROSURGICAL) ×1
ELECTRODE REM PT RTRN 9FT ADLT (ELECTROSURGICAL) ×1 IMPLANT
GLOVE BIOGEL M 6.5 STRL (GLOVE) ×3 IMPLANT
GLOVE BIOGEL PI IND STRL 6.5 (GLOVE) ×6 IMPLANT
GLOVE BIOGEL PI IND STRL 7.0 (GLOVE) ×6 IMPLANT
HEMOSTAT ARISTA ABSORB 3G PWDR (HEMOSTASIS) IMPLANT
HOLDER FOLEY CATH W/STRAP (MISCELLANEOUS) IMPLANT
IRRIG SUCT STRYKERFLOW 2 WTIP (MISCELLANEOUS) ×1
IRRIGATION SUCT STRKRFLW 2 WTP (MISCELLANEOUS) ×1 IMPLANT
IV NS 1000ML (IV SOLUTION) ×1
IV NS 1000ML BAXH (IV SOLUTION) IMPLANT
KIT TURNOVER CYSTO (KITS) ×1 IMPLANT
LEGGING LITHOTOMY PAIR STRL (DRAPES) ×1 IMPLANT
OBTURATOR OPTICAL STANDARD 8MM (TROCAR)
OBTURATOR OPTICAL STND 8 DVNC (TROCAR)
OBTURATOR OPTICALSTD 8 DVNC (TROCAR) IMPLANT
OCCLUDER COLPOPNEUMO (BALLOONS) ×1 IMPLANT
PACK ROBOT WH (CUSTOM PROCEDURE TRAY) ×1 IMPLANT
PACK ROBOTIC GOWN (GOWN DISPOSABLE) ×1 IMPLANT
PACK TRENDGUARD 450 HYBRID PRO (MISCELLANEOUS) IMPLANT
PAD OB MATERNITY 4.3X12.25 (PERSONAL CARE ITEMS) ×1 IMPLANT
PAD PREP 24X48 CUFFED NSTRL (MISCELLANEOUS) ×1 IMPLANT
POWDER SURGICEL 3.0 GRAM (HEMOSTASIS) IMPLANT
SCISSORS LAP 5X45 EPIX DISP (ENDOMECHANICALS) IMPLANT
SEAL CANN UNIV 5-8 DVNC XI (MISCELLANEOUS) ×4 IMPLANT
SEAL XI 5MM-8MM UNIVERSAL (MISCELLANEOUS) ×5
SEALER VESSEL DA VINCI XI (MISCELLANEOUS) ×1
SEALER VESSEL EXT DVNC XI (MISCELLANEOUS) IMPLANT
SET IRRIG Y TYPE TUR BLADDER L (SET/KITS/TRAYS/PACK) IMPLANT
SET TRI-LUMEN FLTR TB AIRSEAL (TUBING) IMPLANT
SPIKE FLUID TRANSFER (MISCELLANEOUS) ×2 IMPLANT
SUT VIC AB 0 CT1 27 (SUTURE) ×2
SUT VIC AB 0 CT1 27XBRD ANBCTR (SUTURE) ×2 IMPLANT
SUT VIC AB 4-0 PS2 18 (SUTURE) IMPLANT
SUT VICRYL RAPIDE 4/0 PS 2 (SUTURE) ×3 IMPLANT
SUT VLOC 180 0 9IN  GS21 (SUTURE) ×1
SUT VLOC 180 0 9IN GS21 (SUTURE) ×1 IMPLANT
TIP ENDOSCOPIC SURGICEL (TIP) IMPLANT
TIP UTERINE 6.7X8CM BLUE DISP (MISCELLANEOUS) IMPLANT
TOWEL OR 17X26 10 PK STRL BLUE (TOWEL DISPOSABLE) ×1 IMPLANT
TRENDGUARD 450 HYBRID PRO PACK (MISCELLANEOUS) ×1
TROCAR PORT AIRSEAL 8X120 (TROCAR) ×1 IMPLANT
WATER STERILE IRR 500ML POUR (IV SOLUTION) IMPLANT

## 2022-10-08 NOTE — Discharge Summary (Signed)
Physician Discharge Summary  Patient ID: Emily Montoya MRN: [id number] DOB/AGE: 1971-01-19 51 y.o.  Admit date: 10/08/2022 Discharge date: 10/08/2022  Admission Diagnoses: Abnormal uterine bleeding Fibroid uterus History of breast cancer  Discharge Diagnoses:  Principal Problem:   Abnormal uterine bleeding (AUB) Active Problems:   Fibroids Peritoneal nodule Ovarian cysts  Procedure(s): 1. Robotic Assisted Total Laparoscopic Hysterectomy with Bilateral Salpingo-oophorectomy 2. Resection of peritoneal nodule  Discharged Condition: good  Hospital Course: Patient was admitted on 10/08/2022 for the above named procedure(s) for the above named diagnoses. Prior to hospital discharge, patient was tolerating PO, ambulating, voiding spontaneously, passing flatus***, and pain was well-controlled. See hospital chart for specific details. Patient was discharged home in stable condition.   Consults: None  Significant Diagnostic Studies: None  Treatments: surgery: As documented above  Discharge Exam: Blood pressure 126/75, pulse 72, temperature (!) 97.1 F (36.2 C), temperature source Oral, resp. rate 16, height '5\' 2"'$  (1.575 m), weight 73.2 kg, last menstrual period 09/06/2022, SpO2 100 %.   Disposition: Discharge disposition: 01-Home or Self Care       Discharge Instructions     Call MD for:  difficulty breathing, headache or visual disturbances   Complete by: As directed    Call MD for:  extreme fatigue   Complete by: As directed    Call MD for:  hives   Complete by: As directed    Call MD for:  persistant dizziness or light-headedness   Complete by: As directed    Call MD for:  persistant nausea and vomiting   Complete by: As directed    Call MD for:  redness, tenderness, or signs of infection (pain, swelling, redness, odor or green/yellow discharge around incision site)   Complete by: As directed    Call MD for:  severe uncontrolled pain   Complete by: As  directed    Call MD for:  temperature >100.4   Complete by: As directed    Diet - low sodium heart healthy   Complete by: As directed    Discharge instructions   Complete by: As directed    Light spotting is normal. Call with any heavy vaginal bleeding.   Driving Restrictions   Complete by: As directed    No driving for at least 2 weeks. No driving while taking narcotic medications.   Increase activity slowly   Complete by: As directed    Lifting restrictions   Complete by: As directed    No lifting heavier than 15lbs.   Sexual Activity Restrictions   Complete by: As directed    No sexual intercourse or objects in the vagina for at least 6 weeks.      Allergies as of 10/08/2022       Reactions   Meloxicam Swelling   Swelling of the feet, able to use Advil        Medication List     STOP taking these medications    naproxen sodium 220 MG tablet Commonly known as: ALEVE   oxyCODONE-acetaminophen 5-325 MG tablet Commonly known as: PERCOCET/ROXICET       TAKE these medications    gabapentin 100 MG capsule Commonly known as: NEURONTIN Take 100 mg by mouth 3 (three) times daily.   ibuprofen 600 MG tablet Commonly known as: ADVIL Take 1 tablet (600 mg total) by mouth every 6 (six) hours as needed.   oxyCODONE 5 MG immediate release tablet Commonly known as: Oxy IR/ROXICODONE Take 1 tablet (5 mg total) by mouth every  6 (six) hours as needed for severe pain or breakthrough pain.   tamoxifen 20 MG tablet Commonly known as: NOLVADEX Take 1 tablet (20 mg total) by mouth daily.   tiZANidine 4 MG tablet Commonly known as: Zanaflex Take 1 tablet (4 mg total) by mouth every 8 (eight) hours as needed for muscle spasms.   venlafaxine XR 75 MG 24 hr capsule Commonly known as: EFFEXOR-XR TAKE 1 CAPSULE BY MOUTH ONCE DAILY WITH BREAKFAST What changed:  how much to take how to take this when to take this        Follow-up Information     Drema Dallas, DO  Follow up.   Specialty: Obstetrics and Gynecology Why: Please keep your post-operative visits in 2 and 6 weeks. Contact information: [address] [place] [address] [phone]                 Signed: Drema Dallas 10/08/2022, 11:32 AM

## 2022-10-08 NOTE — Transfer of Care (Signed)
Immediate Anesthesia Transfer of Care Note  Patient: Emily Montoya  Procedure(s) Performed: Procedure(s) (LRB): XI ROBOTIC ASSISTED LAPAROSCOPIC HYSTERECTOMY AND SALPINGO-OOPHORECTOMY (Bilateral)  Patient Location: PACU  Anesthesia Type: General  Level of Consciousness: awake, sedated, patient cooperative and responds to stimulation  Airway & Oxygen Therapy: Patient Spontanous Breathing and Patient connected to face mask oxygen  Post-op Assessment: Report given to PACU RN, Post -op Vital signs reviewed and stable and Patient moving all extremities  Post vital signs: Reviewed and stable  Complications: No apparent anesthesia complications

## 2022-10-08 NOTE — Interval H&P Note (Signed)
History and Physical Interval Note:  10/08/2022 8:20 AM  Emily Montoya  has presented today for surgery, with the diagnosis of Abnormal Uterine Bleeding.  The various methods of treatment have been discussed with the patient and family. After consideration of risks, benefits and other options for treatment, the patient has consented to  Procedure(s): XI ROBOTIC ASSISTED LAPAROSCOPIC HYSTERECTOMY AND SALPINGECTOMY (Bilateral) and patient also desires bilateral oophorectomy as a surgical intervention.  The patient's history has been reviewed, patient examined, no change in status, stable for surgery.  I have reviewed the patient's chart and labs.  Questions were answered to the patient's satisfaction.     Drema Dallas

## 2022-10-08 NOTE — Op Note (Addendum)
Pre Op Dx:   1. Abnormal uterine bleeding 2. Fibroid uterus 3. History of breast cancer  Post Op Dx:   1. Abnormal uterine bleeding 2. Fibroid uterus 3. History of breast cancer 4. Peritoneal nodule 5. Ovarian cysts  Procedure:   1. Robotic Assisted Total Laparoscopic Hysterectomy with Bilateral Salpingo-oophorectomy 2. Resection of peritoneal nodule   Surgeon:  Dr. Drema Dallas Assistants:  Gaylord Shih, RNFA Anesthesia:  General   EBL:  50cc  IVF:  1500cc UOP:  200cc clear yellow urine   Drains:  Foley catheter Specimen removed:  Uterus, bilateral fallopian tubes, bilateral ovaries, and peritoneal nodule -- sent to pathology Device(s) implanted: None Case Type:  Clean-contaminated Findings:  Cervix appeared normal. Enlarged ~14cm fibroid uterus. Normal-appearing ovaries bilaterally with multiple follicles present on each. Evidence of prior bilateral tubal ligation with adhesions of the tubes to the anterior abdominal wall. Omental adhesions at the umbilicus. A ~1.5cm right pelvic peritoneal nodule which appeared consistent with a fibroid was noted. Thin, filmy adhesions anterior to the uterus involving the bladder. Normal-appearing liver contours. Bilateral ureters with peristalsis seen prior to after closure of the vaginal cuff. Uterus weight: approximately 290 grams. Complications: None Indications:  51 y.o. G9P3 with AUB-L and history of breast cancer who desired definitive surgical management and bilateral oophorectomy.  Description of each procedure:  After informed consent the patient was taken to the operating room and placed in dorsal supine position where general endotracheal anesthesia was administered and found to be adequate.  She was placed in dorsal lithotomy position with her arms tucked.  She was prepped and draped in the usual sterile fashion. A timeout was called and the procedure confirmed. A RUMI uterine manipulator with the Koh cup and a Foley catheter were  placed.    A 67m supraumbilical incision was made and a trochar was used to enter the abdomen under direct visualization.  Pneumoperitoneum was established and atraumatic entry confirmed. Three additional 596mports were placed on either side of the umbilicus and an 68m468mort was placed in the left upper quadrant under direct visualization. All port sites were injected with 10cc local anesthetic. The pelvis was bathed in a 60cc Ropivicaine solution.  The patient was placed in Trendelenburg position and the da [employer]botic device was docked.  Next, attention was turned to the console where the hysterectomy was performed. The omental adhesions were taken down bluntly and with electrosurgery. Hemostasis ensured. The ureters were identified bilaterally. The right infundibulopelvic ligament was ligated using electrosurgery. The uteroovarian anastamosis was divided and the right round ligament was divided.  This process was repeated on the contralateral side. Small, filmy adhesions were taken down using electrosurgery. The anterior leaflet of the broad ligament was divided to create a bladder flap. The uterine artery and vein were skeletonized and desiccated superior to the Koh cup.  This process was repeated on the contralateral side.  Uterine blanching was observed.  A circumferential colpotomy was created along the ridge of the Koh cup and the uterus was passed off the field.  The bilateral fallopian tubes and ovaries were then removed through the vagina. The vaginal occluder was placed in the vagina to maintain pneumoperitoneum. The peritoneal nodule was resected and removed through the assistant port.  The vaginal cuff was then closed with V-loc suture. Hemostasis confirmed. Surgicel hemostatic powder was placed on the vaginal cuff and adnexa bilaterally.  Copious suction-irrigation performed with excellent hemostasis noted. The Da Vinci robotic device was undocked and all ports  were visualized.   The  pneumoperitoneum was reduced completely with the assistance of two deep breaths and all ports were removed.  The skin was closed with 4-0 Vicryl in subcuticular fashion with skin glue placed atop each port site.   The vagina was inspected and there were no vaginal tears noted and no foreign objects remaining in the vagina. The vaginal cuff was intact. The patient was returned to dorsal supine position, awakened and extubated in the OR having appeared to tolerate the procedure well.  All sponge, needle, and instrument counts were correct x 2 at the end of the case.  Disposition:  PACU  Drema Dallas, DO

## 2022-10-08 NOTE — Progress Notes (Signed)
Patient ambulated with 2 RN's from the stretcher in the hallway to the bed in the room.

## 2022-10-08 NOTE — Anesthesia Postprocedure Evaluation (Signed)
Anesthesia Post Note  Patient: Emily Montoya  Procedure(s) Performed: XI ROBOTIC ASSISTED LAPAROSCOPIC HYSTERECTOMY AND SALPINGO-OOPHORECTOMY (Bilateral: Abdomen)     Patient location during evaluation: PACU Anesthesia Type: General Level of consciousness: awake and alert Pain management: pain level controlled Vital Signs Assessment: post-procedure vital signs reviewed and stable Respiratory status: spontaneous breathing, nonlabored ventilation, respiratory function stable and patient connected to nasal cannula oxygen Cardiovascular status: blood pressure returned to baseline and stable Postop Assessment: no apparent nausea or vomiting Anesthetic complications: no  No notable events documented.  Last Vitals:  Vitals:   10/08/22 1345 10/08/22 1400  BP: 106/75 112/67  Pulse: 74 81  Resp: 16 14  Temp:  36.7 C  SpO2: 96% 98%    Last Pain:  Vitals:   10/08/22 1414  TempSrc:   PainSc: 8                  Barnet Glasgow

## 2022-10-08 NOTE — Anesthesia Procedure Notes (Signed)
Procedure Name: Intubation Date/Time: 10/08/2022 8:50 AM  Performed by: Justice Rocher, CRNAPre-anesthesia Checklist: Patient identified, Emergency Drugs available, Suction available, Patient being monitored and Timeout performed Patient Re-evaluated:Patient Re-evaluated prior to induction Oxygen Delivery Method: Circle system utilized Preoxygenation: Pre-oxygenation with 100% oxygen Induction Type: IV induction Ventilation: Mask ventilation without difficulty Laryngoscope Size: Mac and 3 Grade View: Grade II Tube type: Oral Tube size: 7.0 mm Number of attempts: 1 Airway Equipment and Method: Stylet and Oral airway Placement Confirmation: ETT inserted through vocal cords under direct vision, positive ETCO2, breath sounds checked- equal and bilateral and CO2 detector Secured at: 22 cm Tube secured with: Tape Dental Injury: Teeth and Oropharynx as per pre-operative assessment

## 2022-10-09 ENCOUNTER — Encounter (HOSPITAL_BASED_OUTPATIENT_CLINIC_OR_DEPARTMENT_OTHER): Payer: Self-pay | Admitting: Obstetrics and Gynecology

## 2022-10-09 ENCOUNTER — Encounter: Payer: BC Managed Care – PPO | Admitting: Rehabilitative and Restorative Service Providers"

## 2022-10-09 DIAGNOSIS — D573 Sickle-cell trait: Secondary | ICD-10-CM | POA: Diagnosis not present

## 2022-10-09 DIAGNOSIS — F419 Anxiety disorder, unspecified: Secondary | ICD-10-CM | POA: Diagnosis not present

## 2022-10-09 DIAGNOSIS — N939 Abnormal uterine and vaginal bleeding, unspecified: Secondary | ICD-10-CM | POA: Diagnosis not present

## 2022-10-09 DIAGNOSIS — Z87891 Personal history of nicotine dependence: Secondary | ICD-10-CM | POA: Diagnosis not present

## 2022-10-09 DIAGNOSIS — D259 Leiomyoma of uterus, unspecified: Secondary | ICD-10-CM | POA: Diagnosis not present

## 2022-10-09 DIAGNOSIS — N83209 Unspecified ovarian cyst, unspecified side: Secondary | ICD-10-CM | POA: Diagnosis not present

## 2022-10-09 DIAGNOSIS — Z853 Personal history of malignant neoplasm of breast: Secondary | ICD-10-CM | POA: Diagnosis not present

## 2022-10-09 MED ORDER — ACETAMINOPHEN 500 MG PO TABS
ORAL_TABLET | ORAL | Status: AC
  Filled 2022-10-09: qty 2

## 2022-10-09 MED ORDER — OXYCODONE HCL 5 MG PO TABS
ORAL_TABLET | ORAL | Status: AC
  Filled 2022-10-09: qty 2

## 2022-10-09 MED ORDER — IBUPROFEN 200 MG PO TABS
ORAL_TABLET | ORAL | Status: AC
  Filled 2022-10-09: qty 3

## 2022-10-10 LAB — SURGICAL PATHOLOGY

## 2022-10-28 ENCOUNTER — Other Ambulatory Visit: Payer: Self-pay | Admitting: Hematology

## 2022-10-28 DIAGNOSIS — N898 Other specified noninflammatory disorders of vagina: Secondary | ICD-10-CM | POA: Diagnosis not present

## 2022-11-03 ENCOUNTER — Ambulatory Visit: Payer: BC Managed Care – PPO | Attending: Hematology

## 2022-11-03 VITALS — Wt 158.2 lb

## 2022-11-03 DIAGNOSIS — Z483 Aftercare following surgery for neoplasm: Secondary | ICD-10-CM | POA: Insufficient documentation

## 2022-11-03 NOTE — Therapy (Signed)
OUTPATIENT PHYSICAL THERAPY SOZO SCREENING NOTE   Patient Name: Emily Montoya MRN: [id number] DOB:07/02/1971, 52 y.o., female Today's Date: 11/03/2022  PCP: Wenda Low, MD REFERRING PROVIDER: Truitt Merle, MD   PT End of Session - 11/03/22 1550     Visit Number 3   # unchanged due to screen only   PT Start Time 3612    PT Stop Time 1552    PT Time Calculation (min) 4 min    Behavior During Therapy [facility] for tasks assessed/performed             Past Medical History:  Diagnosis Date   Anemia 09/2021   Anxiety Jan 16, 2022   Follows w/ Elias Else, therapist. Currently taking Effexor.   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 Truitt Merle, [place] 08/08/22 in Epic   Cervical radiculopathy 11/11/2021   Heart murmur    as a child   History of kidney stones 01/16/21   passed on own   Lymphedema Jan 16, 2022   right breast   Personal history of radiation therapy    January through 16-Jan-2022, right breast cancer   Pneumonia 05/2022   developed 2 weeks post-op neck surgery, treated w/ antibiotics and resolved per pt   Shoulder pain, right    Pt developed right shoulder pain after lymph node dissection surgery in 09/2021. S/p steroid injections x 4 in 2022-01-16.   Sickle cell trait ([place])    Wears contact lenses    Wears glasses    Past Surgical History:  Procedure Laterality Date   BREAST BIOPSY Right 07/2021   BREAST LUMPECTOMY WITH RADIOACTIVE SEED AND SENTINEL LYMPH NODE BIOPSY Right 10/11/2021   Procedure: RIGHT BREAST LUMPECTOMY WITH RADIOACTIVE SEED LOCALIZATION AND SENTINEL LYMPH NODE BIOPSY;  Surgeon: [name], MD;  Location: [place];  Service: General;  Laterality: Right;   CESAREAN SECTION  Jan 16, 2002   ENDOMETRIAL BIOPSY  08/25/2022   benign proliferative endometrium, negative for hyperplasia or malignancy   NECK SURGERY   05/2022   Dr. Sherley Bounds   ROBOTIC ASSISTED LAPAROSCOPIC HYSTERECTOMY AND SALPINGECTOMY Bilateral 10/08/2022   Procedure: XI ROBOTIC ASSISTED LAPAROSCOPIC HYSTERECTOMY AND SALPINGO-OOPHORECTOMY;  Surgeon: Drema Dallas, DO;  Location: [place];  Service: Gynecology;  Laterality: Bilateral;   TUBAL LIGATION  16-Jan-2002   Patient Active Problem List   Diagnosis Date Noted   Fibroids 09/30/2022   Abnormal uterine bleeding (AUB) 09/30/2022   Genetic testing 09/23/2021   Malignant neoplasm of upper-inner quadrant of right breast in female, estrogen receptor positive ([place]) 08/29/2021    REFERRING DIAG: right breast cancer at risk for lymphedema  THERAPY DIAG: Aftercare following surgery for neoplasm  PERTINENT HISTORY: R breast cancer ER+PR+, R breast lumpectomy and SLNB (0/7) on 10/11/21 , Cervical surgery on 06/13/2022   PRECAUTIONS: right UE Lymphedema risk, None  SUBJECTIVE: Pt returns for her 3 month L-Dex screen.   PAIN:  Are you having pain? No  SOZO SCREENING: Patient was assessed today using the SOZO machine to determine the lymphedema index score. This was compared to her baseline score. It was determined that she is within the recommended range when compared to her baseline and no further action is needed at this time. She will continue SOZO screenings. These are done every 3 months for 2 years post operatively followed by  every 6 months for 2 years, and then annually.   L-DEX FLOWSHEETS - 11/03/22 1500       L-DEX LYMPHEDEMA SCREENING   Measurement Type Unilateral    L-DEX MEASUREMENT EXTREMITY Upper Extremity    POSITION  Standing    DOMINANT SIDE Right    At Risk Side Right    BASELINE SCORE (UNILATERAL) -0.3    L-DEX SCORE (UNILATERAL) 5.8    VALUE CHANGE (UNILAT) 6.1              Otelia Limes, PTA 11/03/2022, 3:53 PM

## 2022-12-12 DIAGNOSIS — R232 Flushing: Secondary | ICD-10-CM | POA: Diagnosis not present

## 2023-02-02 ENCOUNTER — Ambulatory Visit: Payer: BC Managed Care – PPO | Attending: Hematology

## 2023-02-02 VITALS — Wt 160.5 lb

## 2023-02-02 DIAGNOSIS — Z483 Aftercare following surgery for neoplasm: Secondary | ICD-10-CM | POA: Insufficient documentation

## 2023-02-02 NOTE — Therapy (Signed)
OUTPATIENT PHYSICAL THERAPY SOZO SCREENING NOTE   Patient Name: Emily Montoya MRN: [id number] DOB:11/20/1970, 52 y.o., female Today's Date: 02/02/2023  PCP: Georgann Housekeeper, MD REFERRING PROVIDER: Malachy Mood, MD   PT End of Session - 02/02/23 0831     Visit Number 3   # unchanged due to screen only   PT Start Time 0829    PT Stop Time 0833    PT Time Calculation (min) 4 min    Activity Tolerance Patient tolerated treatment well    Behavior During Therapy [facility] for tasks assessed/performed             Past Medical History:  Diagnosis Date   Anemia 09/2021   Anxiety Feb 15, 2022   Follows w/ Georgiann Mohs, therapist. Currently taking Effexor.   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 Malachy Mood, LOV 08/08/22 in Epic   Cervical radiculopathy 11/11/2021   Heart murmur    as a child   History of kidney stones 15-Feb-2021   passed on own   Lymphedema 02/15/2022   right breast   Personal history of radiation therapy    January through March 2023, right breast cancer   Pneumonia 05/2022   developed 2 weeks post-op neck surgery, treated w/ antibiotics and resolved per pt   Shoulder pain, right    Pt developed right shoulder pain after lymph node dissection surgery in 09/2021. S/p steroid injections x 4 in 2022-02-15.   Sickle cell trait ([facility])    Wears contact lenses    Wears glasses    Past Surgical History:  Procedure Laterality Date   BREAST BIOPSY Right 07/2021   BREAST LUMPECTOMY WITH RADIOACTIVE SEED AND SENTINEL LYMPH NODE BIOPSY Right 10/11/2021   Procedure: RIGHT BREAST LUMPECTOMY WITH RADIOACTIVE SEED LOCALIZATION AND SENTINEL LYMPH NODE BIOPSY;  Surgeon: Griselda Miner, MD;  Location: [facility];  Service: General;  Laterality: Right;   CESAREAN SECTION  02/15/2002   ENDOMETRIAL BIOPSY  08/25/2022   benign proliferative endometrium,  negative for hyperplasia or malignancy   NECK SURGERY  05/2022   Dr. Marikay Alar   ROBOTIC ASSISTED LAPAROSCOPIC HYSTERECTOMY AND SALPINGECTOMY Bilateral 10/08/2022   Procedure: XI ROBOTIC ASSISTED LAPAROSCOPIC HYSTERECTOMY AND SALPINGO-OOPHORECTOMY;  Surgeon: Steva Ready, DO;  Location: [facility] [place];  Service: Gynecology;  Laterality: Bilateral;   TUBAL LIGATION  15-Feb-2002   Patient Active Problem List   Diagnosis Date Noted   Fibroids 09/30/2022   Abnormal uterine bleeding (AUB) 09/30/2022   Genetic testing 09/23/2021   Malignant neoplasm of upper-inner quadrant of right breast in female, estrogen receptor positive 08/29/2021    REFERRING DIAG: right breast cancer at risk for lymphedema  THERAPY DIAG: Aftercare following surgery for neoplasm  PERTINENT HISTORY: R breast cancer ER+PR+, R breast lumpectomy and SLNB (0/7) on 10/11/21 , Cervical surgery on 06/13/2022   PRECAUTIONS: right UE Lymphedema risk, None  SUBJECTIVE: Pt returns for her 3 month L-Dex screen.  "I got compression sleeves and I've been wearing them a few hours a day and I think that has been helping my pain a lot."  PAIN:  Are you having pain? No  SOZO SCREENING: Patient was assessed today using the SOZO machine to determine the lymphedema index score. This was compared to her baseline score. It was determined that she is within the recommended  range when compared to her baseline and no further action is needed at this time. She will continue SOZO screenings. These are done every 3 months for 2 years post operatively followed by every 6 months for 2 years, and then annually.   L-DEX FLOWSHEETS - 02/02/23 0800       L-DEX LYMPHEDEMA SCREENING   Measurement Type Unilateral    L-DEX MEASUREMENT EXTREMITY Upper Extremity    POSITION  Standing    DOMINANT SIDE Right    At Risk Side Right    BASELINE SCORE (UNILATERAL) -0.3    L-DEX SCORE (UNILATERAL) 0    VALUE CHANGE (UNILAT) 0.3               Hermenia Bers, PTA 02/02/2023, 8:33 AM

## 2023-02-04 ENCOUNTER — Other Ambulatory Visit: Payer: Self-pay | Admitting: Nurse Practitioner

## 2023-02-04 ENCOUNTER — Other Ambulatory Visit: Payer: Self-pay

## 2023-02-04 DIAGNOSIS — C50211 Malignant neoplasm of upper-inner quadrant of right female breast: Secondary | ICD-10-CM

## 2023-02-04 NOTE — Progress Notes (Unsigned)
Patient Care Team: Emily HousekeeperHusain, Karrar, MD as PCP - General (Internal Medicine) Emily Mineroth, Paul III, MD as Consulting Physician (General Surgery) Emily MoodFeng, Yan, MD as Consulting Physician (Hematology) Emily AngelicaMartini, Keisha N, RN as Oncology Nurse Navigator Emily ProudStuart, Dawn C, RN as Oncology Nurse Navigator Emily SamplesBurton, Emily Riolo K, NP as Nurse Practitioner (Nurse Practitioner)   CHIEF COMPLAINT: Follow up right breast cancer   Oncology History Overview Note   Cancer Staging  Malignant neoplasm of upper-inner quadrant of right breast in female, estrogen receptor positive Emily Montoya(HCC) Staging form: Breast, AJCC 8th Edition - Clinical stage from 08/20/2021: Stage IA (cT1c, cN0, cM0, G2, ER+, PR+, HER2-) - Signed by Emily MoodFeng, Yan, MD on 09/02/2021 Method of lymph node assessment: Clinical Histologic grading system: 3 grade system - Pathologic stage from 10/11/2021: Stage IA (pT1c, pN0, cM0, G2, ER+, PR+, HER2-, Oncotype DX score: 16) - Signed by Emily MoodFeng, Yan, MD on 01/08/2022 Stage prefix: Initial diagnosis Multigene prognostic tests performed: Oncotype DX Recurrence score range: Greater than or equal to 11 Histologic grading system: 3 grade system     Malignant neoplasm of upper-inner quadrant of right breast in female, estrogen receptor positive  08/14/2021 Mammogram   EXAM: DIGITAL DIAGNOSTIC BILATERAL MAMMOGRAM WITH TOMOSYNTHESIS AND CAD; ULTRASOUND RIGHT BREAST LIMITED  IMPRESSION: 1. Suspicious right breast mass at the 1 o'clock position 15 cm from the nipple on the right. It measures 1.1 x 1.3 x 0.9 cm. Recommendation is for ultrasound-guided biopsy. 2. No suspicious right axillary lymphadenopathy. 3. No mammographic evidence of malignancy on the left.   08/20/2021 Pathology Results   Diagnosis Breast, right, needle core biopsy, 1 o'clock, 15 cmfn, ribbon clip - INVASIVE DUCTAL CARCINOMA - SEE COMMENT  Microscopic Comment Based on the biopsy, the carcinoma appears Nottingham grade 2 of 3 and measures 1.2 cm in  greatest linear extent.  PROGNOSTIC INDICATORS Results: The tumor cells are NEGATIVE for Her2 (1+). Estrogen Receptor: 90%, POSITIVE, MODERATE STAINNG INTENSITY Progesterone Receptor: 90%, POSITIVE, STRONG STAINING INTENSITY Proliferation Marker Ki67: 15%   08/20/2021 Cancer Staging   Staging form: Breast, AJCC 8th Edition - Clinical stage from 08/20/2021: Stage IA (cT1c, cN0, cM0, G2, ER+, PR+, HER2-) - Signed by Emily MoodFeng, Yan, MD on 09/02/2021 Method of lymph node assessment: Clinical Histologic grading system: 3 grade system   08/29/2021 Initial Diagnosis   Malignant neoplasm of upper-inner quadrant of right breast in female, estrogen receptor positive (HCC)   09/22/2021 Genetic Testing   Negative genetic testing on the CancerNext-Expanded+RNAinsight panel.  The report date is September 20, 2021.  The CancerNext-Expanded gene panel offered by [facility] and includes sequencing and rearrangement analysis for the following 77 genes: AIP, ALK, APC*, ATM*, AXIN2, BAP1, BARD1, BLM, BMPR1A, BRCA1*, BRCA2*, BRIP1*, CDC73, CDH1*, CDK4, CDKN1B, CDKN2A, CHEK2*, CTNNA1, DICER1, FANCC, FH, FLCN, GALNT12, KIF1B, LZTR1, MAX, MEN1, MET, MLH1*, MSH2*, MSH3, MSH6*, MUTYH*, NBN, NF1*, NF2, NTHL1, PALB2*, PHOX2B, PMS2*, POT1, PRKAR1A, PTCH1, PTEN*, RAD51C*, RAD51D*, RB1, RECQL, RET, SDHA, SDHAF2, SDHB, SDHC, SDHD, SMAD4, SMARCA4, SMARCB1, SMARCE1, STK11, SUFU, TMEM127, TP53*, TSC1, TSC2, VHL and XRCC2 (sequencing and deletion/duplication); EGFR, EGLN1, HOXB13, KIT, MITF, PDGFRA, POLD1, and POLE (sequencing only); EPCAM and GREM1 (deletion/duplication only). DNA and RNA analyses performed for * genes.   10/11/2021 Cancer Staging   Staging form: Breast, AJCC 8th Edition - Pathologic stage from 10/11/2021: Stage IA (pT1c, pN0, cM0, G2, ER+, PR+, HER2-, Oncotype DX score: 16) - Signed by Emily MoodFeng, Yan, MD on 01/08/2022 Stage prefix: Initial diagnosis Multigene prognostic tests performed: Oncotype DX Recurrence score  range: Greater than or equal to 11 Histologic grading system: 3 grade system   10/11/2021 Definitive Surgery   FINAL MICROSCOPIC DIAGNOSIS:   A. LYMPH NODE, RIGHT AXILLARY #1, SENTINEL, EXCISION:  - One lymph node negative for metastatic carcinoma (0/1).   B. LYMPH NODE, RIGHT AXILLARY, SENTINEL, EXCISION:  - One lymph node negative for metastatic carcinoma (0/1).   [place]. LYMPH NODE, RIGHT AXILLARY, SENTINEL, EXCISION:  - One lymph node negative for metastatic carcinoma (0/1).   D. LYMPH NODE, RIGHT AXILLARY #2, SENTINEL, EXCISION:  - One lymph node negative for metastatic carcinoma (0/1).   E. LYMPH NODE, RIGHT AXILLARY, SENTINEL, EXCISION:  - One lymph node negative for metastatic carcinoma (0/1).   F. BREAST, RIGHT, LUMPECTOMY:  - Invasive and in situ ductal carcinoma, 1.4 cm.  - Margins negative for carcinoma.  - Biopsy site and biopsy clip.  - See oncology table.    10/11/2021 Oncotype testing   Oncotype DX was obtained on the final surgical sample and the recurrence score of 16 predicts a risk of recurrence outside the breast over the next 9 years of 4%, if the patient's only systemic therapy is an antiestrogen for 5 years.  It also predicts no significant benefit from chemotherapy.    05/05/2022 Survivorship   SCP delivered by Emily Glad, NP      CURRENT THERAPY: Tamoxifen 20 mg daily, starting 08/2021, held for 1-2 months periodically   INTERVAL HISTORY Emily Montoya returns for follow up as scheduled. Last seen by [name] 08/08/22. She underwent hysterectomy/BSO 10/08/22 by Dr. Connye Montoya, all path was benign. She is having hot flashes, ob/gyn put her on Emily Montoya which is helping. She is still recovering, with some residual incisional pain and pain in low back and tops of thighs. She thinks this may be related to work from home situation because she also went back to work in January. Sometimes hard to walk stairs. Has intermittent right breast pain, no new lump/mass. Sleeve is helping  neuropathy but right arm reach is still limited. She has not been back to PT in a while with all she has going on. Tolerating tamoxifen well.   ROS  All other systems reviewed and negative   Past Medical History:  Diagnosis Date   Anemia 09/2021   Anxiety 02/25/22   Follows w/ Georgiann Mohs, therapist. Currently taking Effexor.   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 Malachy Mood, LOV 08/08/22 in Epic   Cervical radiculopathy 11/11/2021   Heart murmur    as a child   History of kidney stones Feb 25, 2021   passed on own   Lymphedema 02/25/2022   right breast   Personal history of radiation therapy    January through March 2023, right breast cancer   Pneumonia 05/2022   developed 2 weeks post-op neck surgery, treated w/ antibiotics and resolved per pt   Shoulder pain, right    Pt developed right shoulder pain after lymph node dissection surgery in 09/2021. S/p steroid injections x 4 in Feb 25, 2022.   Sickle cell trait    Wears contact lenses    Wears glasses      Past Surgical History:  Procedure Laterality Date   BREAST BIOPSY Right 07/2021   BREAST LUMPECTOMY WITH RADIOACTIVE SEED AND SENTINEL LYMPH NODE BIOPSY Right 10/11/2021   Procedure: RIGHT BREAST LUMPECTOMY WITH RADIOACTIVE  SEED LOCALIZATION AND SENTINEL LYMPH NODE BIOPSY;  Surgeon: Emily Miner, MD;  Location: [facility];  Service: General;  Laterality: Right;   CESAREAN SECTION  2003   ENDOMETRIAL BIOPSY  08/25/2022   benign proliferative endometrium, negative for hyperplasia or malignancy   NECK SURGERY  05/2022   Dr. Marikay Alar   ROBOTIC ASSISTED LAPAROSCOPIC HYSTERECTOMY AND SALPINGECTOMY Bilateral 10/08/2022   Procedure: XI ROBOTIC ASSISTED LAPAROSCOPIC HYSTERECTOMY AND SALPINGO-OOPHORECTOMY;  Surgeon: Steva Ready, DO;  Location: [facility];  Service:  Gynecology;  Laterality: Bilateral;   TUBAL LIGATION  2003     Outpatient Encounter Medications as of 02/05/2023  Medication Sig   anastrozole (ARIMIDEX) 1 MG tablet Take 1 tablet (1 mg total) by mouth daily.   gabapentin (NEURONTIN) 100 MG capsule Take 100 mg by mouth 3 (three) times daily.   ibuprofen (ADVIL) 600 MG tablet Take 1 tablet (600 mg total) by mouth every 6 (six) hours as needed.   oxyCODONE (OXY IR/ROXICODONE) 5 MG immediate release tablet Take 1 tablet (5 mg total) by mouth every 6 (six) hours as needed for severe pain or breakthrough pain.   venlafaxine XR (EFFEXOR-XR) 75 MG 24 hr capsule Take 1 capsule (75 mg total) by mouth daily.   [DISCONTINUED] tamoxifen (NOLVADEX) 20 MG tablet Take 1 tablet (20 mg total) by mouth daily.   tiZANidine (ZANAFLEX) 4 MG tablet Take 1 tablet (4 mg total) by mouth every 8 (eight) hours as needed for muscle spasms. (Patient not taking: Reported on 09/30/2022)   No facility-administered encounter medications on file as of 02/05/2023.     Today's Vitals   02/05/23 1032  BP: 112/69  Pulse: 65  Resp: 17  Temp: 99 F (37.2 Montoya)  TempSrc: Oral  SpO2: 100%  Weight: 161 lb 14.4 oz (73.4 kg)   Body mass index is 29.61 kg/m.   PHYSICAL EXAM GENERAL:alert, no distress and comfortable SKIN: no rash  EYES: sclera clear NECK: without mass LYMPH:  no palpable cervical or supraclavicular lymphadenopathy  LUNGS: clear with normal breathing effort HEART: regular rate & rhythm, no lower extremity edema ABDOMEN: abdomen soft, non-tender and normal bowel sounds NEURO: alert & oriented x 3 with fluent speech, no focal motor/sensory deficits Breast exam: No nipple discharge or inversion.  S/p right lumpectomy and radiation, incisions completely healed with mild scar tissue.  No palpable mass or nodularity in either breast or axilla that I could appreciate   CBC    Component Value Date/Time   WBC 4.6 02/05/2023 1015   WBC 5.7 10/06/2022 0909   RBC  4.55 02/05/2023 1015   HGB 11.8 (L) 02/05/2023 1015   HCT 37.0 02/05/2023 1015   PLT 302 02/05/2023 1015   MCV 81.3 02/05/2023 1015   MCH 25.9 (L) 02/05/2023 1015   MCHC 31.9 02/05/2023 1015   RDW 14.2 02/05/2023 1015   LYMPHSABS 1.4 02/05/2023 1015   MONOABS 0.4 02/05/2023 1015   EOSABS 0.1 02/05/2023 1015   BASOSABS 0.0 02/05/2023 1015     CMP     Component Value Date/Time   NA 142 02/05/2023 1015   Mont[place] 3.7 02/05/2023 1015   CL 109 02/05/2023 1015   CO2 27 02/05/2023 1015   GLUCOSE 112 (H) 02/05/2023 1015   BUN 12 02/05/2023 1015   CREATININE 0.89 02/05/2023 1015   CALCIUM 9.5 02/05/2023 1015   PROT 6.8 02/05/2023 1015   ALBUMIN 3.8 02/05/2023 1015   AST 17 02/05/2023 1015   ALT 14  02/05/2023 1015   ALKPHOS 60 02/05/2023 1015   BILITOT 0.5 02/05/2023 1015   GFRNONAA >60 02/05/2023 1015   GFRAA >60 06/14/2019 1610     ASSESSMENT & PLAN: 52 year old female   1. Malignant neoplasm of upper-inner quadrant of right breast, Stage IA, p(T1c, N0), ER+/PR+/HER2-, Grade 2  -presented with palpable right breast lump x6 months. Biopsy 08/20/21 showed IDC, grade 2, Ki67 of 15%. -she was started on tamoxifen neoadjuvantly due to heavy menses. -S/p right lumpectomy on 10/11/21 by Dr. Carolynne Edouard showed 1.4 cm invasive and in situ ductal carcinoma. Margins and lymph nodes negative. -oncotype RS of 16, low risk, Adjuvant chemo is not recommended  -She resumed tamoxifen in early 10/2021, held 02/2022 for depression/mood swings. Effexor started and depression improved. Resumed low dose tamoxifen 10 mg 05/05/22, tolerating better -Mammogram 09/23/2022 was benign, breast density category B -Ms. Dudek is clinically doing well.  Tolerating tamoxifen.  Recently underwent total hysterectomy and BSO for heavy bleeding, all path was benign.  She is recovering.  Breast exam is benign, labs are stable.  Overall no clinical concern for breast cancer recurrence -We discussed now she is postmenopausal and a  candidate for AI, which is slightly more effective than tamoxifen in postmenopausal women for reducing the risk of breast cancer recurrence.  We briefly reviewed the potential side effect profile, she is open to trying. -Stop tamoxifen, and start anastrozole 02/25/23.  -Phone follow-up in 3 months for toxicity check, then in person visit in 6 months, or sooner if needed   2. Genetics -testing was negative 09/05/21    PLAN: -Last mammogram and today's labs reviewed  -Continue breast cancer surveillance  -Stop Tamoxifen, start anastrozole 02/25/23 now that she is s/p hyst/BSO -Phone f/up in 3 months for tox check -Lab and office visit in 6 months    All questions were answered. The patient knows to call the clinic with any problems, questions or concerns. No barriers to learning were detected. I spent 20 minutes counseling the patient face to face. The total time spent in the appointment was 30 minutes and more than 50% was on counseling, review of test results, and coordination of care.   Emily Glad, NP-[place] 02/05/2023

## 2023-02-05 ENCOUNTER — Encounter: Payer: Self-pay | Admitting: Nurse Practitioner

## 2023-02-05 ENCOUNTER — Inpatient Hospital Stay: Payer: BC Managed Care – PPO | Attending: Nurse Practitioner

## 2023-02-05 ENCOUNTER — Inpatient Hospital Stay (HOSPITAL_BASED_OUTPATIENT_CLINIC_OR_DEPARTMENT_OTHER): Payer: BC Managed Care – PPO | Admitting: Nurse Practitioner

## 2023-02-05 ENCOUNTER — Other Ambulatory Visit: Payer: Self-pay

## 2023-02-05 VITALS — BP 112/69 | HR 65 | Temp 99.0°F | Resp 17 | Wt 161.9 lb

## 2023-02-05 DIAGNOSIS — Z9079 Acquired absence of other genital organ(s): Secondary | ICD-10-CM | POA: Diagnosis not present

## 2023-02-05 DIAGNOSIS — C50211 Malignant neoplasm of upper-inner quadrant of right female breast: Secondary | ICD-10-CM | POA: Diagnosis not present

## 2023-02-05 DIAGNOSIS — Z79811 Long term (current) use of aromatase inhibitors: Secondary | ICD-10-CM | POA: Insufficient documentation

## 2023-02-05 DIAGNOSIS — Z90710 Acquired absence of both cervix and uterus: Secondary | ICD-10-CM | POA: Diagnosis not present

## 2023-02-05 DIAGNOSIS — Z170 Estrogen receptor positive status [ER+]: Secondary | ICD-10-CM

## 2023-02-05 DIAGNOSIS — Z79899 Other long term (current) drug therapy: Secondary | ICD-10-CM | POA: Insufficient documentation

## 2023-02-05 DIAGNOSIS — Z90722 Acquired absence of ovaries, bilateral: Secondary | ICD-10-CM | POA: Diagnosis not present

## 2023-02-05 DIAGNOSIS — Z923 Personal history of irradiation: Secondary | ICD-10-CM | POA: Diagnosis not present

## 2023-02-05 LAB — CBC WITH DIFFERENTIAL (CANCER CENTER ONLY)
Abs Immature Granulocytes: 0.01 10*3/uL (ref 0.00–0.07)
Basophils Absolute: 0 10*3/uL (ref 0.0–0.1)
Basophils Relative: 0 %
Eosinophils Absolute: 0.1 10*3/uL (ref 0.0–0.5)
Eosinophils Relative: 2 %
HCT: 37 % (ref 36.0–46.0)
Hemoglobin: 11.8 g/dL — ABNORMAL LOW (ref 12.0–15.0)
Immature Granulocytes: 0 %
Lymphocytes Relative: 31 %
Lymphs Abs: 1.4 10*3/uL (ref 0.7–4.0)
MCH: 25.9 pg — ABNORMAL LOW (ref 26.0–34.0)
MCHC: 31.9 g/dL (ref 30.0–36.0)
MCV: 81.3 fL (ref 80.0–100.0)
Monocytes Absolute: 0.4 10*3/uL (ref 0.1–1.0)
Monocytes Relative: 9 %
Neutro Abs: 2.7 10*3/uL (ref 1.7–7.7)
Neutrophils Relative %: 58 %
Platelet Count: 302 10*3/uL (ref 150–400)
RBC: 4.55 MIL/uL (ref 3.87–5.11)
RDW: 14.2 % (ref 11.5–15.5)
WBC Count: 4.6 10*3/uL (ref 4.0–10.5)
nRBC: 0 % (ref 0.0–0.2)

## 2023-02-05 LAB — CMP (CANCER CENTER ONLY)
ALT: 14 U/L (ref 0–44)
AST: 17 U/L (ref 15–41)
Albumin: 3.8 g/dL (ref 3.5–5.0)
Alkaline Phosphatase: 60 U/L (ref 38–126)
Anion gap: 6 (ref 5–15)
BUN: 12 mg/dL (ref 6–20)
CO2: 27 mmol/L (ref 22–32)
Calcium: 9.5 mg/dL (ref 8.9–10.3)
Chloride: 109 mmol/L (ref 98–111)
Creatinine: 0.89 mg/dL (ref 0.44–1.00)
GFR, Estimated: >60 mL/min (ref >60)
Glucose, Bld: 112 mg/dL — ABNORMAL HIGH (ref 70–99)
Potassium: 3.7 mmol/L (ref 3.5–5.1)
Sodium: 142 mmol/L (ref 135–145)
Total Bilirubin: 0.5 mg/dL (ref 0.3–1.2)
Total Protein: 6.8 g/dL (ref 6.5–8.1)

## 2023-02-05 MED ORDER — ANASTROZOLE 1 MG PO TABS: 1.0000 mg | ORAL_TABLET | Freq: Every day | ORAL | 3 refills | Status: DC

## 2023-03-12 DIAGNOSIS — A599 Trichomoniasis, unspecified: Secondary | ICD-10-CM | POA: Diagnosis not present

## 2023-03-12 DIAGNOSIS — L910 Hypertrophic scar: Secondary | ICD-10-CM | POA: Diagnosis not present

## 2023-03-12 DIAGNOSIS — R232 Flushing: Secondary | ICD-10-CM | POA: Diagnosis not present

## 2023-03-12 DIAGNOSIS — N760 Acute vaginitis: Secondary | ICD-10-CM | POA: Diagnosis not present

## 2023-03-12 DIAGNOSIS — N941 Unspecified dyspareunia: Secondary | ICD-10-CM | POA: Diagnosis not present

## 2023-04-01 ENCOUNTER — Encounter: Payer: Self-pay | Admitting: Nurse Practitioner

## 2023-04-02 ENCOUNTER — Other Ambulatory Visit: Payer: Self-pay | Admitting: Nurse Practitioner

## 2023-04-02 MED ORDER — VENLAFAXINE HCL ER 75 MG PO CP24: 75.0000 mg | ORAL_CAPSULE | Freq: Every day | ORAL | 0 refills | Status: DC

## 2023-04-15 ENCOUNTER — Other Ambulatory Visit: Payer: Self-pay | Admitting: Hematology

## 2023-05-18 ENCOUNTER — Ambulatory Visit: Payer: BC Managed Care – PPO | Admitting: Rehabilitation

## 2023-05-18 NOTE — Therapy (Unsigned)
OUTPATIENT PHYSICAL THERAPY SOZO SCREENING NOTE   Patient Name: Emily Montoya MRN: [id number] DOB:06-Nov-1970, 52 y.o., female Today's Date: 05/19/2023  PCP: Georgann Housekeeper, MD REFERRING PROVIDER: Malachy Mood, MD   PT End of Session - 05/19/23 0800     Visit Number 3   due to screen onlly   PT Start Time 0801    PT Stop Time 0810    PT Time Calculation (min) 9 min    Activity Tolerance Patient tolerated treatment well    Behavior During Therapy [facility] for tasks assessed/performed             Past Medical History:  Diagnosis Date   Anemia 09/2021   Anxiety 2022-06-23   Follows w/ Georgiann Mohs, therapist. Currently taking Effexor.   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 Malachy Mood, LOV 08/08/22 in Epic   Cervical radiculopathy 11/11/2021   Heart murmur    as a child   History of kidney stones 23-Jun-2021   passed on own   Lymphedema 06/23/2022   right breast   Personal history of radiation therapy    January through March 2023, right breast cancer   Pneumonia Jun 23, 2022   developed 2 weeks post-op neck surgery, treated w/ antibiotics and resolved per pt   Shoulder pain, right    Pt developed right shoulder pain after lymph node dissection surgery in 09/2021. S/p steroid injections x 4 in 06-23-22.   Sickle cell trait (HCC)    Wears contact lenses    Wears glasses    Past Surgical History:  Procedure Laterality Date   BREAST BIOPSY Right 07/2021   BREAST LUMPECTOMY WITH RADIOACTIVE SEED AND SENTINEL LYMPH NODE BIOPSY Right 10/11/2021   Procedure: RIGHT BREAST LUMPECTOMY WITH RADIOACTIVE SEED LOCALIZATION AND SENTINEL LYMPH NODE BIOPSY;  Surgeon: Griselda Miner, MD;  Location: [facility];  Service: General;  Laterality: Right;   CESAREAN SECTION  23-Jun-2002   ENDOMETRIAL BIOPSY  08/25/2022   benign proliferative endometrium, negative for  hyperplasia or malignancy   NECK SURGERY  2022-06-23   Dr. Marikay Alar   ROBOTIC ASSISTED LAPAROSCOPIC HYSTERECTOMY AND SALPINGECTOMY Bilateral 10/08/2022   Procedure: XI ROBOTIC ASSISTED LAPAROSCOPIC HYSTERECTOMY AND SALPINGO-OOPHORECTOMY;  Surgeon: Steva Ready, DO;  Location: [facility];  Service: Gynecology;  Laterality: Bilateral;   TUBAL LIGATION  06/23/2002   Patient Active Problem List   Diagnosis Date Noted   Fibroids 09/30/2022   Abnormal uterine bleeding (AUB) 09/30/2022   Genetic testing 09/23/2021   Malignant neoplasm of upper-inner quadrant of right breast in female, estrogen receptor positive (HCC) 08/29/2021    REFERRING DIAG: right breast cancer at risk for lymphedema  THERAPY DIAG:  Aftercare following surgery for neoplasm  PERTINENT HISTORY:   R breast cancer ER+PR+, R breast lumpectomy and SLNB (0/7) on 10/11/21 , Cervical surgery on 06/13/2022 , and some pain still under the armpit on right  PRECAUTIONS: right UE Lymphedema risk,   SUBJECTIVE:  Starting to get tingling and arm pain again. Going to make appt with surgeon and see what is going on with my neck.  PAIN:  Are you having pain? Neck pain , 5/10 and the tingling is coming back.  SOZO SCREENING: Patient was assessed today using the SOZO machine to determine the lymphedema index score. This was compared to her baseline  score. It was determined that she is within the recommended range when compared to her baseline and no further action is needed at this time. She will continue SOZO screenings. These are done every 3 months for 2 years post operatively followed by every 6 months for 2 years, and then annually.    Waynette Buttery, PT 05/19/2023, 8:11 AM

## 2023-05-19 ENCOUNTER — Ambulatory Visit: Payer: BC Managed Care – PPO | Attending: Hematology

## 2023-05-19 DIAGNOSIS — Z483 Aftercare following surgery for neoplasm: Secondary | ICD-10-CM | POA: Insufficient documentation

## 2023-05-31 NOTE — Progress Notes (Unsigned)
Patient Care Team: Georgann Housekeeper, MD as PCP - General (Internal Medicine) Griselda Miner, MD as Consulting Physician (General Surgery) Malachy Mood, MD as Consulting Physician (Hematology) Donnelly Angelica, RN as Oncology Nurse Navigator Pershing Proud, RN as Oncology Nurse Navigator Pollyann Samples, NP as Nurse Practitioner (Nurse Practitioner)   I connected with Emily Montoya on 06/04/23 at  9:00 AM EDT by telephone visit and verified that I am speaking with the correct person using two identifiers.   I discussed the limitations, risks, security and privacy concerns of performing an evaluation and management service by telemedicine and the availability of in-person appointments. I also discussed with the patient that there may be a patient responsible charge related to this service. The patient expressed understanding and agreed to proceed.   Other persons participating in the visit and their role in the encounter: None   Patient's location: Home  Provider's location: [facility] Office   CHIEF COMPLAINT: AI med check, h/o right breast cancer   Oncology History Overview Note   Cancer Staging  Malignant neoplasm of upper-inner quadrant of right breast in female, estrogen receptor positive (HCC) Staging form: Breast, AJCC 8th Edition - Clinical stage from 08/20/2021: Stage IA (cT1c, cN0, cM0, G2, ER+, PR+, HER2-) - Signed by Malachy Mood, MD on 09/02/2021 Method of lymph node assessment: Clinical Histologic grading system: 3 grade system - Pathologic stage from 10/11/2021: Stage IA (pT1c, pN0, cM0, G2, ER+, PR+, HER2-, Oncotype DX score: 16) - Signed by Malachy Mood, MD on 01/08/2022 Stage prefix: Initial diagnosis Multigene prognostic tests performed: Oncotype DX Recurrence score range: Greater than or equal to 11 Histologic grading system: 3 grade system     Malignant neoplasm of upper-inner quadrant of right breast in female, estrogen receptor positive (HCC)  08/14/2021 Mammogram    EXAM: DIGITAL DIAGNOSTIC BILATERAL MAMMOGRAM WITH TOMOSYNTHESIS AND CAD; ULTRASOUND RIGHT BREAST LIMITED  IMPRESSION: 1. Suspicious right breast mass at the 1 o'clock position 15 cm from the nipple on the right. It measures 1.1 x 1.3 x 0.9 cm. Recommendation is for ultrasound-guided biopsy. 2. No suspicious right axillary lymphadenopathy. 3. No mammographic evidence of malignancy on the left.   08/20/2021 Pathology Results   Diagnosis Breast, right, needle core biopsy, 1 o'clock, 15 cmfn, ribbon clip - INVASIVE DUCTAL CARCINOMA - SEE COMMENT  Microscopic Comment Based on the biopsy, the carcinoma appears Nottingham grade 2 of 3 and measures 1.2 cm in greatest linear extent.  PROGNOSTIC INDICATORS Results: The tumor cells are NEGATIVE for Her2 (1+). Estrogen Receptor: 90%, POSITIVE, MODERATE STAINNG INTENSITY Progesterone Receptor: 90%, POSITIVE, STRONG STAINING INTENSITY Proliferation Marker Ki67: 15%   08/20/2021 Cancer Staging   Staging form: Breast, AJCC 8th Edition - Clinical stage from 08/20/2021: Stage IA (cT1c, cN0, cM0, G2, ER+, PR+, HER2-) - Signed by Malachy Mood, MD on 09/02/2021 Method of lymph node assessment: Clinical Histologic grading system: 3 grade system   08/29/2021 Initial Diagnosis   Malignant neoplasm of upper-inner quadrant of right breast in female, estrogen receptor positive (HCC)   09/22/2021 Genetic Testing   Negative genetic testing on the CancerNext-Expanded+RNAinsight panel.  The report date is September 20, 2021.  The CancerNext-Expanded gene panel offered by [facility] and includes sequencing and rearrangement analysis for the following 77 genes: AIP, ALK, APC*, ATM*, AXIN2, BAP1, BARD1, BLM, BMPR1A, BRCA1*, BRCA2*, BRIP1*, CDC73, CDH1*, CDK4, CDKN1B, CDKN2A, CHEK2*, CTNNA1, DICER1, FANCC, FH, FLCN, GALNT12, KIF1B, LZTR1, MAX, MEN1, MET, MLH1*, MSH2*, MSH3, MSH6*, MUTYH*, NBN, NF1*,  NF2, NTHL1, PALB2*, PHOX2B, PMS2*, POT1, PRKAR1A, PTCH1, PTEN*,  RAD51C*, RAD51D*, RB1, RECQL, RET, SDHA, SDHAF2, SDHB, SDHC, SDHD, SMAD4, SMARCA4, SMARCB1, SMARCE1, STK11, SUFU, TMEM127, TP53*, TSC1, TSC2, VHL and XRCC2 (sequencing and deletion/duplication); EGFR, EGLN1, HOXB13, KIT, MITF, PDGFRA, POLD1, and POLE (sequencing only); EPCAM and GREM1 (deletion/duplication only). DNA and RNA analyses performed for * genes.   10/11/2021 Cancer Staging   Staging form: Breast, AJCC 8th Edition - Pathologic stage from 10/11/2021: Stage IA (pT1c, pN0, cM0, G2, ER+, PR+, HER2-, Oncotype DX score: 16) - Signed by Malachy Mood, MD on 01/08/2022 Stage prefix: Initial diagnosis Multigene prognostic tests performed: Oncotype DX Recurrence score range: Greater than or equal to 11 Histologic grading system: 3 grade system   10/11/2021 Definitive Surgery   FINAL MICROSCOPIC DIAGNOSIS:   A. LYMPH NODE, RIGHT AXILLARY #1, SENTINEL, EXCISION:  - One lymph node negative for metastatic carcinoma (0/1).   B. LYMPH NODE, RIGHT AXILLARY, SENTINEL, EXCISION:  - One lymph node negative for metastatic carcinoma (0/1).   C. LYMPH NODE, RIGHT AXILLARY, SENTINEL, EXCISION:  - One lymph node negative for metastatic carcinoma (0/1).   D. LYMPH NODE, RIGHT AXILLARY #2, SENTINEL, EXCISION:  - One lymph node negative for metastatic carcinoma (0/1).   E. LYMPH NODE, RIGHT AXILLARY, SENTINEL, EXCISION:  - One lymph node negative for metastatic carcinoma (0/1).   F. BREAST, RIGHT, LUMPECTOMY:  - Invasive and in situ ductal carcinoma, 1.4 cm.  - Margins negative for carcinoma.  - Biopsy site and biopsy clip.  - See oncology table.    10/11/2021 Oncotype testing   Oncotype DX was obtained on the final surgical sample and the recurrence score of 16 predicts a risk of recurrence outside the breast over the next 9 years of 4%, if the patient's only systemic therapy is an antiestrogen for 5 years.  It also predicts no significant benefit from chemotherapy.    05/05/2022 Survivorship   SCP  delivered by Santiago Glad, NP      CURRENT THERAPY: Tamoxifen 20 mg daily, starting 08/2021, held for 1-2 months periodically. Stopped and s/p hyst/BSO, started AI 01/2023 - stopped 02/2023 due to SE's; restarted Tamoxifen 02/2023  INTERVAL HISTORY Emily Montoya presents virtually as scheduled. Last seen by me 02/05/23. After recovering from hyst/BSO she started AI in May, took it for a week but had intense nausea that kept her bed bound. She restarted Tamoxifen which she tolerates much better with no side effects except hot flashes. These are well controlled with effexor. She has no other cancer related concerns. Pain from her C spine surgery is starting to come back.   ROS  All other systems reviewed and negative  Past Medical History:  Diagnosis Date   Anemia 09/2021   Anxiety Jun 17, 2022   Follows w/ Georgiann Mohs, therapist. Currently taking Effexor.   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 Malachy Mood, LOV 08/08/22 in Epic   Cervical radiculopathy 11/11/2021   Heart murmur    as a child   History of kidney stones Jun 17, 2021   passed on own   Lymphedema June 17, 2022   right breast   Personal history of radiation therapy    January through March 2023, right breast cancer   Pneumonia Jun 17, 2022   developed 2 weeks post-op neck surgery, treated w/ antibiotics and resolved per pt  Shoulder pain, right    Pt developed right shoulder pain after lymph node dissection surgery in 09/2021. S/p steroid injections x 4 in 2023.   Sickle cell trait (HCC)    Wears contact lenses    Wears glasses      Past Surgical History:  Procedure Laterality Date   BREAST BIOPSY Right 07/2021   BREAST LUMPECTOMY WITH RADIOACTIVE SEED AND SENTINEL LYMPH NODE BIOPSY Right 10/11/2021   Procedure: RIGHT BREAST LUMPECTOMY WITH RADIOACTIVE SEED LOCALIZATION AND SENTINEL LYMPH NODE BIOPSY;   Surgeon: Griselda Miner, MD;  Location: [facility];  Service: General;  Laterality: Right;   CESAREAN SECTION  2003   ENDOMETRIAL BIOPSY  08/25/2022   benign proliferative endometrium, negative for hyperplasia or malignancy   NECK SURGERY  05/2022   Dr. Marikay Alar   ROBOTIC ASSISTED LAPAROSCOPIC HYSTERECTOMY AND SALPINGECTOMY Bilateral 10/08/2022   Procedure: XI ROBOTIC ASSISTED LAPAROSCOPIC HYSTERECTOMY AND SALPINGO-OOPHORECTOMY;  Surgeon: Steva Ready, DO;  Location: [facility];  Service: Gynecology;  Laterality: Bilateral;   TUBAL LIGATION  2003     Outpatient Encounter Medications as of 06/04/2023  Medication Sig   anastrozole (ARIMIDEX) 1 MG tablet Take 1 tablet (1 mg total) by mouth daily.   gabapentin (NEURONTIN) 100 MG capsule Take 100 mg by mouth 3 (three) times daily.   ibuprofen (ADVIL) 600 MG tablet Take 1 tablet (600 mg total) by mouth every 6 (six) hours as needed.   oxyCODONE (OXY IR/ROXICODONE) 5 MG immediate release tablet Take 1 tablet (5 mg total) by mouth every 6 (six) hours as needed for severe pain or breakthrough pain.   tiZANidine (ZANAFLEX) 4 MG tablet Take 1 tablet (4 mg total) by mouth every 8 (eight) hours as needed for muscle spasms. (Patient not taking: Reported on 09/30/2022)   venlafaxine XR (EFFEXOR-XR) 75 MG 24 hr capsule Take 1 capsule (75 mg total) by mouth daily.   No facility-administered encounter medications on file as of 06/04/2023.     There were no vitals filed for this visit. There is no height or weight on file to calculate BMI.   PHYSICAL EXAM Voice is strong, speech is clear. Mood/affect appear normal. No cough or conversational dyspnea  LAB DATA No lab data for this visit    ASSESSMENT & PLAN:52 year old female     1. Malignant neoplasm of upper-inner quadrant of right breast, Stage IA, p(T1c, N0), ER+/PR+/HER2-, Grade 2  -presented with palpable right breast lump x6 months. Biopsy 08/20/21 showed IDC,  grade 2, Ki67 of 15%. -she was started on tamoxifen neoadjuvantly due to heavy menses. -S/p right lumpectomy on 10/11/21 by Dr. Carolynne Edouard showed 1.4 cm invasive and in situ ductal carcinoma. Margins and lymph nodes negative. -oncotype RS of 16, low risk, Adjuvant chemo is not recommended  -She resumed tamoxifen in early 10/2021, held 02/2022 for depression/mood swings. Effexor started and depression improved. Resumed low dose tamoxifen 10 mg 05/05/22, tolerating better -Mammogram 09/23/2022 was benign, breast density category B -Recently underwent total hysterectomy and BSO for heavy bleeding, all path was benign.  She recovered well -Due to postmenopausal status, she started Anastrozole 02/25/23, but stopped after 1 week due to severe nausea. She changed back to Tamoxifen late 02/2023 -I reviewed AI's slightly more effective in cancer recurrence risk reduction in postmenopausal women. She does not want to try letrozole or exemestane at this time -Continue Tamoxifen -F/up in 07/2023 as scheduled, mammo in 08/2023   2. Genetics -testing was negative 09/05/21  PLAN: -Continue Tamoxifen, pt did not tolerate anastrozole -F/up in 07/2023 as scheduled -Mammogram in 08/2023, ordered today  Orders Placed This Encounter  Procedures   MM DIAG BREAST TOMO BILATERAL    Standing Status:   Future    Standing Expiration Date:   06/03/2024    Order Specific Question:   Reason for Exam (SYMPTOM  OR DIAGNOSIS REQUIRED)    Answer:   h/o right breast cancer 2022    Order Specific Question:   Is the patient pregnant?    Answer:   No    Order Specific Question:   Preferred imaging location?    Answer:   [facility]     I discussed the assessment and treatment plan with the patient. The patient was provided an opportunity to ask questions and all were answered. The patient agreed with the plan and demonstrated an understanding of the instructions.   The patient was advised to call back or seek an in-person  evaluation if the symptoms worsen or if the condition fails to improve as anticipated. No barriers to learning were detected. I spent 5 minutes counseling the patient non face to face.   Santiago Glad, NP-C 06/04/2023

## 2023-06-04 ENCOUNTER — Inpatient Hospital Stay: Payer: BC Managed Care – PPO | Attending: Nurse Practitioner | Admitting: Nurse Practitioner

## 2023-06-04 ENCOUNTER — Encounter: Payer: Self-pay | Admitting: Nurse Practitioner

## 2023-06-04 DIAGNOSIS — C50211 Malignant neoplasm of upper-inner quadrant of right female breast: Secondary | ICD-10-CM

## 2023-06-04 DIAGNOSIS — Z170 Estrogen receptor positive status [ER+]: Secondary | ICD-10-CM

## 2023-06-22 ENCOUNTER — Other Ambulatory Visit: Payer: Self-pay | Admitting: Nurse Practitioner

## 2023-06-23 MED ORDER — VENLAFAXINE HCL ER 75 MG PO CP24: 75.0000 mg | ORAL_CAPSULE | Freq: Every day | ORAL | 0 refills | Status: DC

## 2023-06-24 ENCOUNTER — Other Ambulatory Visit: Payer: Self-pay | Admitting: Nurse Practitioner

## 2023-06-24 MED ORDER — VENLAFAXINE HCL ER 75 MG PO CP24: 75.0000 mg | ORAL_CAPSULE | Freq: Every day | ORAL | 0 refills | Status: AC

## 2023-06-25 DIAGNOSIS — M5412 Radiculopathy, cervical region: Secondary | ICD-10-CM | POA: Diagnosis not present

## 2023-06-25 DIAGNOSIS — Z6827 Body mass index (BMI) 27.0-27.9, adult: Secondary | ICD-10-CM | POA: Diagnosis not present

## 2023-07-29 DIAGNOSIS — C50211 Malignant neoplasm of upper-inner quadrant of right female breast: Secondary | ICD-10-CM | POA: Diagnosis not present

## 2023-07-29 DIAGNOSIS — Z170 Estrogen receptor positive status [ER+]: Secondary | ICD-10-CM | POA: Diagnosis not present

## 2023-08-05 NOTE — Assessment & Plan Note (Deleted)
Stage IA, p(T1c, N0), ER+/PR+/HER2-, Grade 2  -presented with palpable right breast lump x6 months. She was started tamoxifen neoadjuvantly on 09/09/21 due to heavy menses. S/p right lumpectomy on 10/11/21 by Dr. Carolynne Edouard showed 1.4 cm invasive and in situ ductal carcinoma. Margins and lymph nodes negative. -oncotype RS of 16, low risk. -She resumed tamoxifen in early 10/2021, held 02/2022 for depression/mood swings. Effexor started and depression improved. Resumed low dose tamoxifen 10 mg 05/05/22, tolerating better. She increased back to 20mg  alone with effexor  -she tried anastrozole after BSO in 09/2022 but did not tolerate well  -continue tamoxifen for 10 years if she tolerates

## 2023-08-06 ENCOUNTER — Inpatient Hospital Stay: Payer: BC Managed Care – PPO | Attending: Nurse Practitioner

## 2023-08-06 ENCOUNTER — Inpatient Hospital Stay: Payer: BC Managed Care – PPO | Admitting: Hematology

## 2023-08-06 DIAGNOSIS — C50211 Malignant neoplasm of upper-inner quadrant of right female breast: Secondary | ICD-10-CM

## 2023-08-12 ENCOUNTER — Ambulatory Visit (HOSPITAL_BASED_OUTPATIENT_CLINIC_OR_DEPARTMENT_OTHER): Payer: BC Managed Care – PPO | Admitting: Orthopaedic Surgery

## 2023-08-13 DIAGNOSIS — R232 Flushing: Secondary | ICD-10-CM | POA: Diagnosis not present

## 2023-08-24 ENCOUNTER — Ambulatory Visit: Payer: BC Managed Care – PPO | Attending: Hematology

## 2023-08-24 DIAGNOSIS — Z483 Aftercare following surgery for neoplasm: Secondary | ICD-10-CM | POA: Insufficient documentation

## 2023-09-13 ENCOUNTER — Other Ambulatory Visit: Payer: Self-pay

## 2023-09-13 ENCOUNTER — Encounter (HOSPITAL_COMMUNITY): Payer: Self-pay | Admitting: Emergency Medicine

## 2023-09-13 ENCOUNTER — Ambulatory Visit (HOSPITAL_COMMUNITY)
Admission: EM | Admit: 2023-09-13 | Discharge: 2023-09-13 | Disposition: A | Discharge status: Home / Self Care | Payer: BC Managed Care – PPO | Attending: Family Medicine | Admitting: Family Medicine

## 2023-09-13 DIAGNOSIS — M5412 Radiculopathy, cervical region: Secondary | ICD-10-CM

## 2023-09-13 DIAGNOSIS — M542 Cervicalgia: Secondary | ICD-10-CM | POA: Diagnosis not present

## 2023-09-13 MED ORDER — KETOROLAC TROMETHAMINE 30 MG/ML IJ SOLN
30.0000 mg | Freq: Once | INTRAMUSCULAR | Status: AC
  Administered 2023-09-13: 30 mg via INTRAMUSCULAR

## 2023-09-13 MED ORDER — KETOROLAC TROMETHAMINE 10 MG PO TABS: 10.0000 mg | ORAL_TABLET | Freq: Four times a day (QID) | ORAL | 0 refills | Status: AC | PRN

## 2023-09-13 MED ORDER — GABAPENTIN 300 MG PO CAPS: 300.0000 mg | ORAL_CAPSULE | Freq: Every day | ORAL | 0 refills | Status: AC

## 2023-09-13 MED ORDER — KETOROLAC TROMETHAMINE 30 MG/ML IJ SOLN
INTRAMUSCULAR | Status: AC
  Filled 2023-09-13: qty 1

## 2023-09-13 NOTE — ED Triage Notes (Signed)
Surgery on C -3, 5, 7 one year ago.  Now having pain in left shoulder and tingling in fingers-states this is "the exact pain that was endured prior to surgery" Patient has returned to office and told all is good per xray and prescribed muscle relaxer and prednisone.  These medicines did nothing for patient.  E=went for a follow up visit-could not pay co-pay and was refused an office visit.

## 2023-09-13 NOTE — ED Notes (Signed)
Spoke to patient/family.  Next patient for dr Marlinda Mike to see

## 2023-09-13 NOTE — Discharge Instructions (Signed)
You have been given a shot of Toradol 30 mg today.  Ketorolac 10 mg tablets--take 1 tablet every 6 hours as needed for pain.  This is the same medicine that is in the shot we just gave you  I have sent in a prescription for gabapentin 300 mg--take 1 every evening at bedtime.  Of the 100 mg capsules you have at home try taking 2 of those in the morning and 2 in the afternoon.  Please follow-up with your primary care, so they can adjust your medication doses any further if need, consider referrals to physical therapy or to other specialists.

## 2023-09-13 NOTE — ED Provider Notes (Signed)
[facility]    CSN: [id number] Arrival date & time: 09/13/23  1007      History   Chief Complaint Chief Complaint  Patient presents with   Arm Pain    HPI Emily Montoya is a 52 y.o. female.    Arm Pain  Here for pain in her left neck that radiates into her left arm and causes intense tingling in her second and third digits on the left hand.  No recent trauma or fall.  About 1 year ago she had C-spine surgery.  She had been doing well until about 2 months ago when she started having this pain in her left neck and arm.  She was seen by a midlevel at the office who surgeon had done her surgery.  Plain x-rays were done of her neck that were unrevealing and a prescription was done of prednisone and Flexeril.  The medications helped.  The Flexeril does at least let her sleep at night.  She already takes gabapentin 100 mg 3 times daily.  When she return for follow-up to the surgeons office, she did not have an enough money for the co-pay and was turned away and was not seen.  She does have a primary care provider though she has not seen them in a while.  Meloxicam causes some swelling in the feet, but she has tolerated Toradol and ibuprofen in the past.  She has had a hysterectomy.  Last EGFR was normal   She does have a history of breast cancer in September 30, 2021.  It was invasive, and she is on tamoxifen.  She did have surgery and radiation, but no chemo.  Past Medical History:  Diagnosis Date   Anemia 09/2021   Anxiety 30-Sep-2022   Follows w/ Georgiann Mohs, therapist. Currently taking Effexor.   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 Malachy Mood, LOV 08/08/22 in Epic   Cervical radiculopathy 11/11/2021   Heart murmur    as a child   History of kidney stones 2021-09-30   passed on own   Lymphedema Sep 30, 2022   right breast   Personal  history of radiation therapy    January through March 2023, right breast cancer   Pneumonia 05/2022   developed 2 weeks post-op neck surgery, treated w/ antibiotics and resolved per pt   Shoulder pain, right    Pt developed right shoulder pain after lymph node dissection surgery in 09/2021. S/p steroid injections x 4 in 2022-09-30.   Sickle cell trait (HCC)    Wears contact lenses    Wears glasses     Patient Active Problem List   Diagnosis Date Noted   Fibroids 09/30/2022   Abnormal uterine bleeding (AUB) 09/30/2022   Genetic testing 09/23/2021   Malignant neoplasm of upper-inner quadrant of right breast in female, estrogen receptor positive (HCC) 08/29/2021    Past Surgical History:  Procedure Laterality Date   BREAST BIOPSY Right 07/2021   BREAST LUMPECTOMY WITH RADIOACTIVE SEED AND SENTINEL LYMPH NODE BIOPSY Right 10/11/2021   Procedure: RIGHT BREAST LUMPECTOMY WITH RADIOACTIVE SEED LOCALIZATION AND SENTINEL LYMPH NODE BIOPSY;  Surgeon: Griselda Miner, MD;  Location: [facility];  Service: General;  Laterality: Right;   CESAREAN SECTION  09/30/2002   ENDOMETRIAL BIOPSY  08/25/2022   benign proliferative endometrium, negative for hyperplasia or malignancy  NECK SURGERY  05/2022   Dr. Marikay Alar   ROBOTIC ASSISTED LAPAROSCOPIC HYSTERECTOMY AND SALPINGECTOMY Bilateral 10/08/2022   Procedure: XI ROBOTIC ASSISTED LAPAROSCOPIC HYSTERECTOMY AND SALPINGO-OOPHORECTOMY;  Surgeon: Steva Ready, DO;  Location: [facility];  Service: Gynecology;  Laterality: Bilateral;   TUBAL LIGATION  2003    OB History   No obstetric history on file.      Home Medications    Prior to Admission medications   Medication Sig Start Date End Date Taking? Authorizing Provider  gabapentin (NEURONTIN) 300 MG capsule Take 1 capsule (300 mg total) by mouth at bedtime. (And take 1-2 of the gabapentin 100 mg capsules from the other rx every AM and afternoon) 09/13/23  Yes Casi Westerfeld,  Janace Aris, MD  ketorolac (TORADOL) 10 MG tablet Take 1 tablet (10 mg total) by mouth every 6 (six) hours as needed (pain). 09/13/23  Yes Zenia Resides, MD  anastrozole (ARIMIDEX) 1 MG tablet Take 1 tablet (1 mg total) by mouth daily. 02/05/23   Pollyann Samples, NP  gabapentin (NEURONTIN) 100 MG capsule Take 100 mg by mouth 3 (three) times daily.    Chevis Pretty III, MD  venlafaxine XR (EFFEXOR-XR) 75 MG 24 hr capsule Take 1 capsule (75 mg total) by mouth daily. 06/24/23   Pollyann Samples, NP    Family History Family History  Problem Relation Age of Onset   HIV/AIDS Mother    Cancer Maternal Grandmother        Throat    Social History Social History   Tobacco Use   Smoking status: Former    Current packs/day: 0.00    Average packs/day: 0.3 packs/day for 12.0 years (3.0 ttl pk-yrs)    Types: Cigarettes    Start date: 08/22/2009    Quit date: 08/22/2021    Years since quitting: 2.0   Smokeless tobacco: Never  Vaping Use   Vaping status: Never Used  Substance Use Topics   Alcohol use: Yes    Alcohol/week: 2.0 standard drinks of alcohol    Types: 2 Shots of liquor per week   Drug use: Never     Allergies   Meloxicam   Review of Systems Review of Systems   Physical Exam Triage Vital Signs ED Triage Vitals [09/13/23 1029]  Encounter Vitals Group     BP 122/85     Systolic BP Percentile      Diastolic BP Percentile      Pulse Rate 65     Resp 18     Temp 99.8 F (37.7 C)     Temp Source Oral     SpO2 96 %     Weight      Height      Head Circumference      Peak Flow      Pain Score      Pain Loc      Pain Education      Exclude from Growth Chart    No data found.  Updated Vital Signs BP 122/85 (BP Location: Left Arm)   Pulse 65   Temp 99.8 F (37.7 C) (Oral)   Resp 18   LMP 09/06/2022 (Approximate) Comment: irregular periods  SpO2 96%   Visual Acuity Right Eye Distance:   Left Eye Distance:   Bilateral Distance:    Right Eye Near:   Left Eye  Near:    Bilateral Near:     Physical Exam Vitals reviewed.  Constitutional:  General: She is not in acute distress.    Appearance: She is not ill-appearing, toxic-appearing or diaphoretic.  HENT:     Mouth/Throat:     Mouth: Mucous membranes are moist.  Eyes:     Extraocular Movements: Extraocular movements intact.     Pupils: Pupils are equal, round, and reactive to light.  Cardiovascular:     Rate and Rhythm: Normal rate and regular rhythm.     Heart sounds: No murmur heard. Pulmonary:     Effort: Pulmonary effort is normal.     Breath sounds: Normal breath sounds.  Musculoskeletal:     Cervical back: Tenderness (There is tenderness of the left trapezius.  There is no rash or erythema or deformity) present.  Skin:    Coloration: Skin is not jaundiced or pale.  Neurological:     General: No focal deficit present.     Mental Status: She is alert and oriented to person, place, and time.     Comments: There is some decreased light touch sensation over her hand  Psychiatric:        Behavior: Behavior normal.      UC Treatments / Results  Labs (all labs ordered are listed, but only abnormal results are displayed) Labs Reviewed - No data to display  EKG   Radiology No results found.  Procedures Procedures (including critical care time)  Medications Ordered in UC Medications  ketorolac (TORADOL) 30 MG/ML injection 30 mg (has no administration in time range)    Initial Impression / Assessment and Plan / UC Course  I have reviewed the triage vital signs and the nursing notes.  Pertinent labs & imaging results that were available during my care of the patient were reviewed by me and considered in my medical decision making (see chart for details).     At this time this pain seems typical of cervical radiculopathy.  No imaging is done today.  Toradol injection is given here and Toradol tablets are sent to the pharmacy for pain.  Also I have sent her a  prescription for gabapentin 300 mg to start taking every evening.  Discussed with her that she could try taking 2 of her 100 mg gabapentin's in the morning and afternoon.  I have asked her to follow-up with her primary care. Final Clinical Impressions(s) / UC Diagnoses   Final diagnoses:  Cervical radiculopathy  Neck pain     Discharge Instructions      You have been given a shot of Toradol 30 mg today.  Ketorolac 10 mg tablets--take 1 tablet every 6 hours as needed for pain.  This is the same medicine that is in the shot we just gave you  I have sent in a prescription for gabapentin 300 mg--take 1 every evening at bedtime.  Of the 100 mg capsules you have at home try taking 2 of those in the morning and 2 in the afternoon.  Please follow-up with your primary care, so they can adjust your medication doses any further if need, consider referrals to physical therapy or to other specialists.     ED Prescriptions     Medication Sig Dispense Auth. Provider   ketorolac (TORADOL) 10 MG tablet Take 1 tablet (10 mg total) by mouth every 6 (six) hours as needed (pain). 20 tablet Zenia Resides, MD   gabapentin (NEURONTIN) 300 MG capsule Take 1 capsule (300 mg total) by mouth at bedtime. (And take 1-2 of the gabapentin 100 mg capsules from the other  rx every AM and afternoon) 30 capsule Hadasah Brugger, Janace Aris, MD      I have reviewed the PDMP during this encounter.   Zenia Resides, MD 09/13/23 [phone]

## 2023-09-16 DIAGNOSIS — M25511 Pain in right shoulder: Secondary | ICD-10-CM | POA: Diagnosis not present

## 2023-09-16 DIAGNOSIS — M5090 Cervical disc disorder, unspecified, unspecified cervical region: Secondary | ICD-10-CM | POA: Diagnosis not present

## 2023-09-16 DIAGNOSIS — C50919 Malignant neoplasm of unspecified site of unspecified female breast: Secondary | ICD-10-CM | POA: Diagnosis not present

## 2023-09-22 DIAGNOSIS — M7502 Adhesive capsulitis of left shoulder: Secondary | ICD-10-CM | POA: Diagnosis not present

## 2023-09-30 ENCOUNTER — Other Ambulatory Visit: Payer: Self-pay | Admitting: Nurse Practitioner

## 2023-09-30 ENCOUNTER — Ambulatory Visit
Admission: RE | Admit: 2023-09-30 | Discharge: 2023-09-30 | Disposition: A | Discharge status: Home / Self Care | Payer: BC Managed Care – PPO | Source: Ambulatory Visit | Attending: Nurse Practitioner

## 2023-09-30 DIAGNOSIS — C50211 Malignant neoplasm of upper-inner quadrant of right female breast: Secondary | ICD-10-CM

## 2023-09-30 DIAGNOSIS — N6310 Unspecified lump in the right breast, unspecified quadrant: Secondary | ICD-10-CM | POA: Diagnosis not present

## 2023-10-05 ENCOUNTER — Telehealth: Payer: Self-pay | Admitting: Hematology

## 2023-11-03 ENCOUNTER — Ambulatory Visit: Payer: BC Managed Care – PPO | Admitting: Hematology

## 2023-11-03 ENCOUNTER — Other Ambulatory Visit: Payer: BC Managed Care – PPO

## 2023-11-04 ENCOUNTER — Other Ambulatory Visit: Payer: Self-pay

## 2023-11-04 ENCOUNTER — Ambulatory Visit (HOSPITAL_BASED_OUTPATIENT_CLINIC_OR_DEPARTMENT_OTHER): Payer: BC Managed Care – PPO | Attending: Sports Medicine | Admitting: Physical Therapy

## 2023-11-04 ENCOUNTER — Encounter (HOSPITAL_BASED_OUTPATIENT_CLINIC_OR_DEPARTMENT_OTHER): Payer: Self-pay | Admitting: Physical Therapy

## 2023-11-04 DIAGNOSIS — M25611 Stiffness of right shoulder, not elsewhere classified: Secondary | ICD-10-CM | POA: Insufficient documentation

## 2023-11-04 DIAGNOSIS — R29898 Other symptoms and signs involving the musculoskeletal system: Secondary | ICD-10-CM | POA: Diagnosis not present

## 2023-11-04 DIAGNOSIS — M6281 Muscle weakness (generalized): Secondary | ICD-10-CM | POA: Diagnosis not present

## 2023-11-04 DIAGNOSIS — M25512 Pain in left shoulder: Secondary | ICD-10-CM | POA: Insufficient documentation

## 2023-11-04 DIAGNOSIS — M25612 Stiffness of left shoulder, not elsewhere classified: Secondary | ICD-10-CM | POA: Diagnosis not present

## 2023-11-04 NOTE — Therapy (Signed)
 OUTPATIENT PHYSICAL THERAPY SHOULDER EVALUATION   Patient Name: Emily Montoya MRN: [id number] DOB:[birth year], 53 y.o., female Today's Date: 11/04/2023  END OF SESSION:  PT End of Session - 11/04/23 0806     Visit Number 1    Number of Visits 16   1-2x/week for 8 weeks   Date for PT Re-Evaluation 12/30/23    Authorization Type Primary [employer]; secondary [employer] medicaid    Progress Note Due on Visit 10    PT Start Time 0805    PT Stop Time 0837    PT Time Calculation (min) 32 min    Activity Tolerance Patient tolerated treatment well    Behavior During Therapy [facility] for tasks assessed/performed             Past Medical History:  Diagnosis Date   Anemia 10/31/2021   Anxiety 31-Oct-2022   Follows w/ Adina Sol, therapist. Currently taking Effexor .   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 Onita Mattock, LOV 08/08/22 in Epic   Cervical radiculopathy 11/11/2021   Heart murmur    as a child   History of kidney stones October 31, 2021   passed on own   Lymphedema 2022-10-31   right breast   Personal history of radiation therapy    January through March 2023, right breast cancer   Pneumonia 05/2022   developed 2 weeks post-op neck surgery, treated w/ antibiotics and resolved per pt   Shoulder pain, right    Pt developed right shoulder pain after lymph node dissection surgery in 2021-10-31. S/p steroid injections x 4 in 10/31/2022.   Sickle cell trait (HCC)    Wears contact lenses    Wears glasses    Past Surgical History:  Procedure Laterality Date   BREAST BIOPSY Right 07/2021   BREAST LUMPECTOMY Right 2021/10/31   BREAST LUMPECTOMY WITH RADIOACTIVE SEED AND SENTINEL LYMPH NODE BIOPSY Right 10/11/2021   Procedure: RIGHT BREAST LUMPECTOMY WITH RADIOACTIVE SEED LOCALIZATION AND SENTINEL LYMPH NODE BIOPSY;  Surgeon: Curvin Deward MOULD, MD;  Location: [facility];  Service: General;  Laterality: Right;   CESAREAN SECTION  October 31, 2002   ENDOMETRIAL BIOPSY  08/25/2022   benign proliferative endometrium, negative for hyperplasia or malignancy   NECK SURGERY  05/2022   Dr. Alm Molt   ROBOTIC ASSISTED LAPAROSCOPIC HYSTERECTOMY AND SALPINGECTOMY Bilateral 10/08/2022   Procedure: XI ROBOTIC ASSISTED LAPAROSCOPIC HYSTERECTOMY AND SALPINGO-OOPHORECTOMY;  Surgeon: Storm Setter, DO;  Location: [facility];  Service: Gynecology;  Laterality: Bilateral;   TUBAL LIGATION  2002/10/31   Patient Active Problem List   Diagnosis Date Noted   Fibroids 09/30/2022   Abnormal uterine bleeding (AUB) 09/30/2022   Genetic testing 09/23/2021   Malignant neoplasm of upper-inner quadrant of right breast in female, estrogen receptor positive (HCC) 08/29/2021    PCP: Ardell Manly MD  REFERRING PROVIDER: Dasie Fitch, MD  REFERRING DIAG: M75.02 (ICD-10-CM) - Adhesive capsulitis of left shoulder  THERAPY DIAG:  Stiffness of left shoulder, not elsewhere classified  Other symptoms and signs involving the musculoskeletal system  Muscle weakness (generalized)  Left shoulder pain, unspecified chronicity  Stiffness of right shoulder, not elsewhere classified  Rationale for Evaluation and Treatment: Rehabilitation  ONSET DATE: 4-5 month  SUBJECTIVE:  SUBJECTIVE STATEMENT: Patient states problems with both arms. R shoulder has been getting better and that's where they took the lymph nodes out. She was having issues with the discs in the neck causing L arm symptoms. Issues have returned in LUE since surgery. Still trouble raising the arm. Pain with dressing. Had a shot in her shoulder in about December. Had a frozen shoulder on R after the lymph nodes.  Hand dominance:  Right  PERTINENT HISTORY: R breast cancer ER+PR+, R breast lumpectomy and SLNB (0/7) on 10/11/21 , Cervical surgery on 06/13/2022, chronic shoulder pain, hx R frozen shoulder  PAIN:  Are you having pain? No at rest  PRECAUTIONS: None  RED FLAGS: None   WEIGHT BEARING RESTRICTIONS: No  FALLS:  Has patient fallen in last 6 months? No  OCCUPATION: Pharmacy tech  PLOF: Independent  PATIENT GOALS: needs her arm back so she can reach   OBJECTIVE: (objective measures from initial evaluation unless otherwise dated)  PATIENT SURVEYS:  FOTO 49% function  COGNITION: Overall cognitive status: Within functional limits for tasks assessed     SENSATION: WFL  POSTURE: rounded shoulders and forward head   UPPER EXTREMITY ROM:   Active ROM Right eval Left eval  Shoulder flexion 137  145 PROM 78 115 PROM  Shoulder extension    Shoulder abduction 115 70  Shoulder adduction    Shoulder internal rotation (functional) T5 PSIS  Shoulder external rotation (functional) T5 Scapular spine on L  Elbow flexion    Elbow extension    Wrist flexion    Wrist extension    Wrist ulnar deviation    Wrist radial deviation    Wrist pronation    Wrist supination    (Blank rows = not tested) *=pain/symptoms  UPPER EXTREMITY MMT:  MMT Right eval Left eval  Shoulder flexion 4+ 4 (in limited range)  Shoulder extension    Shoulder abduction 4+ 3+ (in limited range)  Shoulder adduction    Shoulder internal rotation 4+ 4+  Shoulder external rotation 4+ 4  Middle trapezius    Lower trapezius    Elbow flexion    Elbow extension    Wrist flexion    Wrist extension    Wrist ulnar deviation    Wrist radial deviation    Wrist pronation    Wrist supination    Grip strength (lbs)    (Blank rows = not tested) *=pain/symptoms    JOINT MOBILITY TESTING:  Hypomobile bilaterally L>R  PALPATION:  Grossly TTP L shoulder/bicep/tricep   TODAY'S TREATMENT:                                                                                                                                          DATE:  11/04/23 Supine AAROM flexion with wand 2 x 10 Supine AAROM ER with wand 2 x 10  PATIENT EDUCATION:  Education details: Patient educated on  exam findings, POC, scope of PT, HEP, and anatomy and PT progression. Person educated: Patient Education method: Explanation, Demonstration, and Handouts Education comprehension: verbalized understanding, returned demonstration, verbal cues required, and tactile cues required  HOME EXERCISE PROGRAM: Access Code: [email or web address] URL: [email or web address] Date: 11/04/2023 Prepared by: Prentice Jessica Seidman  Exercises - Supine Shoulder Flexion Extension AAROM with Dowel (Mirrored)  - 2-3 x daily - 7 x weekly - 2 sets - 10 reps - Supine Shoulder External Rotation with Dowel  - 2-3 x daily - 7 x weekly - 2 sets - 10 reps  ASSESSMENT:  CLINICAL IMPRESSION: Patient a 53 y.o. y.o. female who was seen today for physical therapy evaluation and treatment for Adhesive capsulitis of left shoulder. Patient presents with pain limited deficits in L shoulder strength, ROM, endurance, activity tolerance, and functional mobility with ADL. Patient is having to modify and restrict ADL as indicated by outcome measure score as well as subjective information and objective measures which is affecting overall participation. Patient will benefit from skilled physical therapy in order to improve function and reduce impairment.  OBJECTIVE IMPAIRMENTS: decreased activity tolerance, decreased mobility, decreased ROM, decreased strength, hypomobility, increased muscle spasms, impaired flexibility, improper body mechanics, postural dysfunction, and pain.   ACTIVITY LIMITATIONS: carrying, lifting, bending, bathing, dressing, reach over head, hygiene/grooming, and caring for others  PARTICIPATION LIMITATIONS: meal prep, cleaning, laundry, driving, shopping, community  activity, occupation, and yard work  PERSONAL FACTORS: Fitness, Past/current experiences, Time since onset of injury/illness/exacerbation, and 3+ comorbidities: R breast cancer ER+PR+, R breast lumpectomy and SLNB (0/7) on 10/11/21 , Cervical surgery on 06/13/2022, chronic shoulder pain, hx R frozen shoulder  are also affecting patient's functional outcome.   REHAB POTENTIAL: Good  CLINICAL DECISION MAKING: Stable/uncomplicated  EVALUATION COMPLEXITY: Low   GOALS: Goals reviewed with patient? Yes  SHORT TERM GOALS: Target date: 12/02/2023    Patient will be independent with HEP in order to improve functional outcomes. Baseline: Goal status: INITIAL  2.  Patient will report at least 25% improvement in symptoms for improved quality of life. Baseline:  Goal status: INITIAL  3.  Patient will demonstrate at least 110 degrees of L shoulder AROM flexion for improved ability to reach overhead.  Baseline:  Goal status: INITIAL    LONG TERM GOALS: Target date: 12/30/2023    Patient will report at least 75% improvement in symptoms for improved quality of life. Baseline:  Goal status: MET  2.  Patient will improve FOTO score to predicted outcomes in order to indicate improved tolerance to activity. Baseline:  Goal status: INITIAL  3.  Patient will demonstrate at least 140 in shoulder AROM in L shoulder flexion for improved ability lift overhead. Baseline:  Goal status: INITIAL  4.  Patient will demonstrate grade of 5/5 MMT grade in all tested musculature as evidence of improved strength to assist with lifting at home. Baseline:  Goal status: INITIAL   PLAN:  PT FREQUENCY: 1-2x/week  PT DURATION: 8 weeks  PLANNED INTERVENTIONS: [id number]- PT Re-evaluation, 97110-Therapeutic exercises, 97530- Therapeutic activity, 97112- Neuromuscular re-education, 97535- Self Care, [id number]- Manual therapy, [phone]- Gait training, [phone]- Orthotic Fit/training, [phone]- Canalith repositioning, [id number]-  Aquatic Therapy, [phone]- Splinting, Patient/Family education, Balance training, Stair training, Taping, Dry Needling, Joint mobilization, Joint manipulation, Spinal manipulation, Spinal mobilization, Scar mobilization, and DME instructions.   PLAN FOR NEXT SESSION: L shoulder mobility and strength, manual for mobility and pain if needed. Thoracic mobility   Prentice GORMAN Stains, PT 11/04/2023, 8:43 AM

## 2023-11-06 ENCOUNTER — Inpatient Hospital Stay: Payer: BC Managed Care – PPO | Admitting: Hematology

## 2023-11-06 ENCOUNTER — Telehealth: Payer: Self-pay

## 2023-11-06 ENCOUNTER — Telehealth: Payer: Self-pay | Admitting: Hematology

## 2023-11-06 ENCOUNTER — Inpatient Hospital Stay: Payer: BC Managed Care – PPO

## 2023-11-06 NOTE — Telephone Encounter (Signed)
 Called patient due to weather to see if she could come in early. She stated they were short handed at work would not be able to leave early but we could reschedule her. Sent a message to scheduling to reschedule her to next week.

## 2023-11-06 NOTE — Telephone Encounter (Signed)
 Left patient a message regarding scheduled appointment times/dates; left callback number for scheduling

## 2023-11-09 ENCOUNTER — Telehealth: Payer: Self-pay | Admitting: Hematology

## 2023-11-09 NOTE — Telephone Encounter (Signed)
 Left patient a voicemail in regards to scheduled appointment times/dates; left callback if needed for rescheduling

## 2023-11-10 ENCOUNTER — Ambulatory Visit (HOSPITAL_BASED_OUTPATIENT_CLINIC_OR_DEPARTMENT_OTHER): Payer: BC Managed Care – PPO | Admitting: Physical Therapy

## 2023-11-12 ENCOUNTER — Telehealth (HOSPITAL_BASED_OUTPATIENT_CLINIC_OR_DEPARTMENT_OTHER): Payer: Self-pay | Admitting: Physical Therapy

## 2023-11-12 DIAGNOSIS — D571 Sickle-cell disease without crisis: Secondary | ICD-10-CM | POA: Diagnosis not present

## 2023-11-12 DIAGNOSIS — Z13220 Encounter for screening for lipoid disorders: Secondary | ICD-10-CM | POA: Diagnosis not present

## 2023-11-12 DIAGNOSIS — Z0000 Encounter for general adult medical examination without abnormal findings: Secondary | ICD-10-CM | POA: Diagnosis not present

## 2023-11-12 DIAGNOSIS — Z23 Encounter for immunization: Secondary | ICD-10-CM | POA: Diagnosis not present

## 2023-11-12 DIAGNOSIS — Z853 Personal history of malignant neoplasm of breast: Secondary | ICD-10-CM | POA: Diagnosis not present

## 2023-11-12 NOTE — Telephone Encounter (Signed)
Patient no show on 11/10/23. Spoke with patient regarding missed appointment and reminded her of next appointment.   8:09 AM, 11/12/23 Wyman Songster PT, DPT Physical Therapist at [facility]

## 2023-11-13 ENCOUNTER — Inpatient Hospital Stay: Payer: BC Managed Care – PPO | Attending: Nurse Practitioner

## 2023-11-13 ENCOUNTER — Encounter: Payer: Self-pay | Admitting: Hematology

## 2023-11-13 ENCOUNTER — Inpatient Hospital Stay (HOSPITAL_BASED_OUTPATIENT_CLINIC_OR_DEPARTMENT_OTHER): Payer: BC Managed Care – PPO | Admitting: Hematology

## 2023-11-13 VITALS — BP 115/60 | HR 84 | Temp 98.2°F | Resp 18 | Wt 158.2 lb

## 2023-11-13 DIAGNOSIS — Z170 Estrogen receptor positive status [ER+]: Secondary | ICD-10-CM

## 2023-11-13 DIAGNOSIS — C50211 Malignant neoplasm of upper-inner quadrant of right female breast: Secondary | ICD-10-CM | POA: Insufficient documentation

## 2023-11-13 DIAGNOSIS — Z79899 Other long term (current) drug therapy: Secondary | ICD-10-CM | POA: Diagnosis not present

## 2023-11-13 DIAGNOSIS — Z1721 Progesterone receptor positive status: Secondary | ICD-10-CM | POA: Insufficient documentation

## 2023-11-13 DIAGNOSIS — N951 Menopausal and female climacteric states: Secondary | ICD-10-CM | POA: Insufficient documentation

## 2023-11-13 DIAGNOSIS — F32A Depression, unspecified: Secondary | ICD-10-CM | POA: Insufficient documentation

## 2023-11-13 DIAGNOSIS — Z1732 Human epidermal growth factor receptor 2 negative status: Secondary | ICD-10-CM | POA: Insufficient documentation

## 2023-11-13 DIAGNOSIS — Z923 Personal history of irradiation: Secondary | ICD-10-CM | POA: Diagnosis not present

## 2023-11-13 LAB — CMP (CANCER CENTER ONLY)
ALT: 12 U/L (ref 0–44)
AST: 16 U/L (ref 15–41)
Albumin: 4 g/dL (ref 3.5–5.0)
Alkaline Phosphatase: 80 U/L (ref 38–126)
Anion gap: 5 (ref 5–15)
BUN: 16 mg/dL (ref 6–20)
CO2: 29 mmol/L (ref 22–32)
Calcium: 9.6 mg/dL (ref 8.9–10.3)
Chloride: 108 mmol/L (ref 98–111)
Creatinine: 0.84 mg/dL (ref 0.44–1.00)
GFR, Estimated: >60 mL/min (ref >60)
Glucose, Bld: 98 mg/dL (ref 70–99)
Potassium: 3.9 mmol/L (ref 3.5–5.1)
Sodium: 142 mmol/L (ref 135–145)
Total Bilirubin: 0.6 mg/dL (ref 0.0–1.2)
Total Protein: 6.8 g/dL (ref 6.5–8.1)

## 2023-11-13 LAB — CBC WITH DIFFERENTIAL (CANCER CENTER ONLY)
Abs Immature Granulocytes: 0.03 10*3/uL (ref 0.00–0.07)
Basophils Absolute: 0 10*3/uL (ref 0.0–0.1)
Basophils Relative: 0 %
Eosinophils Absolute: 0.1 10*3/uL (ref 0.0–0.5)
Eosinophils Relative: 1 %
HCT: 39.6 % (ref 36.0–46.0)
Hemoglobin: 12.5 g/dL (ref 12.0–15.0)
Immature Granulocytes: 0 %
Lymphocytes Relative: 23 %
Lymphs Abs: 1.6 10*3/uL (ref 0.7–4.0)
MCH: 26.5 pg (ref 26.0–34.0)
MCHC: 31.6 g/dL (ref 30.0–36.0)
MCV: 83.9 fL (ref 80.0–100.0)
Monocytes Absolute: 0.6 10*3/uL (ref 0.1–1.0)
Monocytes Relative: 8 %
Neutro Abs: 4.9 10*3/uL (ref 1.7–7.7)
Neutrophils Relative %: 68 %
Platelet Count: 325 10*3/uL (ref 150–400)
RBC: 4.72 MIL/uL (ref 3.87–5.11)
RDW: 14.3 % (ref 11.5–15.5)
WBC Count: 7.2 10*3/uL (ref 4.0–10.5)
nRBC: 0 % (ref 0.0–0.2)

## 2023-11-13 MED ORDER — TAMOXIFEN CITRATE 20 MG PO TABS: 20.0000 mg | ORAL_TABLET | Freq: Every day | ORAL | 3 refills | Status: AC

## 2023-11-13 NOTE — Progress Notes (Signed)
[facility]   Telephone:([phone]) [phone] Fax:([phone]   Clinic Follow up Note   Patient Care Team: Georgann Housekeeper, MD as PCP - General (Internal Medicine) Griselda Miner, MD as Consulting Physician (General Surgery) Malachy Mood, MD as Consulting Physician (Hematology) Donnelly Angelica, RN as Oncology Nurse Navigator Pershing Proud, RN as Oncology Nurse Navigator Pollyann Samples, NP as Nurse Practitioner (Nurse Practitioner)  Date of Service:  11/13/2023  CHIEF COMPLAINT: f/u of right breast cancer  CURRENT THERAPY:  Tamoxifen 20 mg daily  Oncology History   Malignant neoplasm of upper-inner quadrant of right breast in female, estrogen receptor positive (HCC) Stage IA, p(T1c, N0), ER+/PR+/HER2-, Grade 2  -presented with palpable right breast lump x6 months. Biopsy 08/20/21 showed IDC, grade 2, Ki67 of 15%. -she was started on tamoxifen neoadjuvantly due to heavy menses. -S/p right lumpectomy on 10/11/21 by Dr. Carolynne Edouard showed 1.4 cm invasive and in situ ductal carcinoma. Margins and lymph nodes negative. -oncotype RS of 16, low risk, Adjuvant chemo is not recommended  -She resumed tamoxifen in early 10/2021, held 02/2022 for depression/mood swings. Effexor started and depression improved. Resumed low dose tamoxifen 10 mg 05/05/22, tolerating better -Mammogram 09/23/2022 was benign, breast density category B -She underwent total hysterectomy and BSO for heavy bleeding in 09/2022, all path was benign.  She recovered well -Due to postmenopausal status, she started Anastrozole 02/25/23, but stopped after 1 week due to severe nausea. She changed back to Tamoxifen late 02/2023   Assessment and Plan    Breast Cancer Follow-up for breast cancer. Underwent surgery and radiation therapy in 2023. Currently on tamoxifen 20 mg daily, tolerating well with minor insomnia initially. Recent mammogram and ultrasound in December 2024 were negative. No new lumps or pain reported. Breast exam  today is normal with expected post-radiation changes including slight firmness and darker skin on the left side. Discussed continuing tamoxifen for 10 years if well-tolerated, with a minimum of 7 years if not. Recommended follow-up every six months for the first five years, then annually thereafter. Discussed optional breast MRI due to dense breast tissue, although not mandatory without family history or DCIS. - Continue tamoxifen 20 mg daily - Schedule follow-up in six months - Continue annual mammograms - Consider breast MRI due to dense breast tissue, although not mandatory without family history or DCIS  Depression and Hot Flashes On Effexor for depression and hot flashes. Reports difficulty when skipping doses, indicating a need for consistent use. Discussed that Effexor does not cause dependence but should be tapered gradually if discontinuation is considered. - Continue Effexor as prescribed - Advise against skipping doses and discuss gradual tapering if discontinuation is considered  Cervical Spine Surgery Underwent cervical spine surgery (C3-C7) with ongoing issues in the right arm, currently undergoing physical therapy. Taking gabapentin 300 mg for neuropathic pain. - Continue physical therapy - Continue gabapentin 300 mg   Plan -Lab reviewed, exam was unremarkable, patient is doing well, no clinical concern for recurrence. -Continue tamoxifen 20 mg daily - Schedule follow-up appointment in six months with NP         SUMMARY OF ONCOLOGIC HISTORY: Oncology History Overview Note   Cancer Staging  Malignant neoplasm of upper-inner quadrant of right breast in female, estrogen receptor positive (HCC) Staging form: Breast, AJCC 8th Edition - Clinical stage from 08/20/2021: Stage IA (cT1c, cN0, cM0, G2, ER+, PR+, HER2-) - Signed by Malachy Mood, MD on 09/02/2021 Method of lymph node assessment: Clinical Histologic grading system:  3 grade system - Pathologic stage from 10/11/2021:  Stage IA (pT1c, pN0, cM0, G2, ER+, PR+, HER2-, Oncotype DX score: 16) - Signed by Malachy Mood, MD on 01/08/2022 Stage prefix: Initial diagnosis Multigene prognostic tests performed: Oncotype DX Recurrence score range: Greater than or equal to 11 Histologic grading system: 3 grade system     Malignant neoplasm of upper-inner quadrant of right breast in female, estrogen receptor positive (HCC)  08/14/2021 Mammogram   EXAM: DIGITAL DIAGNOSTIC BILATERAL MAMMOGRAM WITH TOMOSYNTHESIS AND CAD; ULTRASOUND RIGHT BREAST LIMITED  IMPRESSION: 1. Suspicious right breast mass at the 1 o'clock position 15 cm from the nipple on the right. It measures 1.1 x 1.3 x 0.9 cm. Recommendation is for ultrasound-guided biopsy. 2. No suspicious right axillary lymphadenopathy. 3. No mammographic evidence of malignancy on the left.   08/20/2021 Pathology Results   Diagnosis Breast, right, needle core biopsy, 1 o'clock, 15 cmfn, ribbon clip - INVASIVE DUCTAL CARCINOMA - SEE COMMENT  Microscopic Comment Based on the biopsy, the carcinoma appears Nottingham grade 2 of 3 and measures 1.2 cm in greatest linear extent.  PROGNOSTIC INDICATORS Results: The tumor cells are NEGATIVE for Her2 (1+). Estrogen Receptor: 90%, POSITIVE, MODERATE STAINNG INTENSITY Progesterone Receptor: 90%, POSITIVE, STRONG STAINING INTENSITY Proliferation Marker Ki67: 15%   08/20/2021 Cancer Staging   Staging form: Breast, AJCC 8th Edition - Clinical stage from 08/20/2021: Stage IA (cT1c, cN0, cM0, G2, ER+, PR+, HER2-) - Signed by Malachy Mood, MD on 09/02/2021 Method of lymph node assessment: Clinical Histologic grading system: 3 grade system   08/29/2021 Initial Diagnosis   Malignant neoplasm of upper-inner quadrant of right breast in female, estrogen receptor positive (HCC)   09/22/2021 Genetic Testing   Negative genetic testing on the CancerNext-Expanded+RNAinsight panel.  The report date is September 20, 2021.  The  CancerNext-Expanded gene panel offered by [facility] and includes sequencing and rearrangement analysis for the following 77 genes: AIP, ALK, APC*, ATM*, AXIN2, BAP1, BARD1, BLM, BMPR1A, BRCA1*, BRCA2*, BRIP1*, CDC73, CDH1*, CDK4, CDKN1B, CDKN2A, CHEK2*, CTNNA1, DICER1, FANCC, FH, FLCN, GALNT12, KIF1B, LZTR1, MAX, MEN1, MET, MLH1*, MSH2*, MSH3, MSH6*, MUTYH*, NBN, NF1*, NF2, NTHL1, PALB2*, PHOX2B, PMS2*, POT1, PRKAR1A, PTCH1, PTEN*, RAD51C*, RAD51D*, RB1, RECQL, RET, SDHA, SDHAF2, SDHB, SDHC, SDHD, SMAD4, SMARCA4, SMARCB1, SMARCE1, STK11, SUFU, TMEM127, TP53*, TSC1, TSC2, VHL and XRCC2 (sequencing and deletion/duplication); EGFR, EGLN1, HOXB13, KIT, MITF, PDGFRA, POLD1, and POLE (sequencing only); EPCAM and GREM1 (deletion/duplication only). DNA and RNA analyses performed for * genes.   10/11/2021 Cancer Staging   Staging form: Breast, AJCC 8th Edition - Pathologic stage from 10/11/2021: Stage IA (pT1c, pN0, cM0, G2, ER+, PR+, HER2-, Oncotype DX score: 16) - Signed by Malachy Mood, MD on 01/08/2022 Stage prefix: Initial diagnosis Multigene prognostic tests performed: Oncotype DX Recurrence score range: Greater than or equal to 11 Histologic grading system: 3 grade system   10/11/2021 Definitive Surgery   FINAL MICROSCOPIC DIAGNOSIS:   A. LYMPH NODE, RIGHT AXILLARY #1, SENTINEL, EXCISION:  - One lymph node negative for metastatic carcinoma (0/1).   B. LYMPH NODE, RIGHT AXILLARY, SENTINEL, EXCISION:  - One lymph node negative for metastatic carcinoma (0/1).   C. LYMPH NODE, RIGHT AXILLARY, SENTINEL, EXCISION:  - One lymph node negative for metastatic carcinoma (0/1).   D. LYMPH NODE, RIGHT AXILLARY #2, SENTINEL, EXCISION:  - One lymph node negative for metastatic carcinoma (0/1).   E. LYMPH NODE, RIGHT AXILLARY, SENTINEL, EXCISION:  - One lymph node negative for metastatic carcinoma (0/1).   F. BREAST, RIGHT,  LUMPECTOMY:  - Invasive and in situ ductal carcinoma, 1.4 cm.  - Margins  negative for carcinoma.  - Biopsy site and biopsy clip.  - See oncology table.    10/11/2021 Oncotype testing   Oncotype DX was obtained on the final surgical sample and the recurrence score of 16 predicts a risk of recurrence outside the breast over the next 9 years of 4%, if the patient's only systemic therapy is an antiestrogen for 5 years.  It also predicts no significant benefit from chemotherapy.    05/05/2022 Survivorship   SCP delivered by Santiago Glad, NP      Discussed the use of AI scribe software for clinical note transcription with the patient, who gave verbal consent to proceed.  History of Present Illness   The patient, a 53 year old with a history of breast cancer, presents for a routine follow-up. She underwent a hysterectomy at the end of 2021-12-19 and was initially prescribed anastrozole, which she did not tolerate well. She has since switched back to tamoxifen 20mg , which she reports tolerating well after an initial adjustment period. The patient reports experiencing some insomnia as a side effect of the tamoxifen.  The patient also reports a lump in her breast, which was investigated with a mammogram and ultrasound in December 2024. The results of these tests were negative, and the patient reports no pain associated with the lump.  In addition to her breast cancer treatment, the patient is also managing other health issues. She underwent neck surgery to remove her C3 and C7, which has resulted in limited mobility in her arm. She is currently attending physical therapy for this issue and taking gabapentin 300mg  for pain management.  The patient is also taking Effexor, which she sometimes skips. She reports feeling unstable when she misses a dose. The medication is used to manage her mood and hot flashes.         All other systems were reviewed with the patient and are negative.  MEDICAL HISTORY:  Past Medical History:  Diagnosis Date   Anemia 09/2021   Anxiety 12/19/21    Follows w/ Georgiann Mohs, therapist. Currently taking Effexor.   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 Malachy Mood, LOV 08/08/22 in Epic   Cervical radiculopathy 11/11/2021   Heart murmur    as a child   History of kidney stones 12/19/2020   passed on own   Lymphedema 12-19-2021   right breast   Personal history of radiation therapy    2023-12-20 through March 2023, right breast cancer   Pneumonia 05/2022   developed 2 weeks post-op neck surgery, treated w/ antibiotics and resolved per pt   Shoulder pain, right    Pt developed right shoulder pain after lymph node dissection surgery in 09/2021. S/p steroid injections x 4 in 12/19/21.   Sickle cell trait (HCC)    Wears contact lenses    Wears glasses     SURGICAL HISTORY: Past Surgical History:  Procedure Laterality Date   BREAST BIOPSY Right 07/2021   BREAST LUMPECTOMY Right 09/2021   BREAST LUMPECTOMY WITH RADIOACTIVE SEED AND SENTINEL LYMPH NODE BIOPSY Right 10/11/2021   Procedure: RIGHT BREAST LUMPECTOMY WITH RADIOACTIVE SEED LOCALIZATION AND SENTINEL LYMPH NODE BIOPSY;  Surgeon: Griselda Miner, MD;  Location: [facility];  Service: General;  Laterality: Right;  CESAREAN SECTION  2003   ENDOMETRIAL BIOPSY  08/25/2022   benign proliferative endometrium, negative for hyperplasia or malignancy   NECK SURGERY  05/2022   Dr. Marikay Alar   ROBOTIC ASSISTED LAPAROSCOPIC HYSTERECTOMY AND SALPINGECTOMY Bilateral 10/08/2022   Procedure: XI ROBOTIC ASSISTED LAPAROSCOPIC HYSTERECTOMY AND SALPINGO-OOPHORECTOMY;  Surgeon: Steva Ready, DO;  Location: [facility] ;  Service: Gynecology;  Laterality: Bilateral;   TUBAL LIGATION  2003    I have reviewed the social history and family history with the patient and they are unchanged from previous note.  ALLERGIES:  is allergic to  meloxicam.  MEDICATIONS:  Current Outpatient Medications  Medication Sig Dispense Refill   tamoxifen (NOLVADEX) 20 MG tablet Take 1 tablet (20 mg total) by mouth daily. 30 tablet 3   gabapentin (NEURONTIN) 100 MG capsule Take 100 mg by mouth 3 (three) times daily.     gabapentin (NEURONTIN) 300 MG capsule Take 1 capsule (300 mg total) by mouth at bedtime. (And take 1-2 of the gabapentin 100 mg capsules from the other rx every AM and afternoon) 30 capsule 0   ketorolac (TORADOL) 10 MG tablet Take 1 tablet (10 mg total) by mouth every 6 (six) hours as needed (pain). 20 tablet 0   venlafaxine XR (EFFEXOR-XR) 75 MG 24 hr capsule Take 1 capsule (75 mg total) by mouth daily. 90 capsule 0   No current facility-administered medications for this visit.    PHYSICAL EXAMINATION: ECOG PERFORMANCE STATUS: 0 - Asymptomatic  Vitals:   11/13/23 1022  BP: 115/60  Pulse: 84  Resp: 18  Temp: 98.2 F (36.8 C)  SpO2: 99%   Wt Readings from Last 3 Encounters:  11/13/23 158 lb 3.2 oz (71.8 kg)  02/05/23 161 lb 14.4 oz (73.4 kg)  02/02/23 160 lb 8 oz (72.8 kg)     GENERAL:alert, no distress and comfortable SKIN: skin color, texture, turgor are normal, no rashes or significant lesions EYES: normal, Conjunctiva are pink and non-injected, sclera clear NECK: supple, thyroid normal size, non-tender, without nodularity LYMPH:  no palpable lymphadenopathy in the cervical, axillary  LUNGS: clear to auscultation and percussion with normal breathing effort HEART: regular rate & rhythm and no murmurs and no lower extremity edema ABDOMEN:abdomen soft, non-tender and normal bowel sounds Musculoskeletal:no cyanosis of digits and no clubbing  NEURO: alert & oriented x 3 with fluent speech, no focal motor/sensory deficits BREAST: Right breast incision site slightly tender, and slightly darker than left breast, firmness noted due to radiation.  No palpable breast mass or axillary adenopathy. MUSCULOSKELETAL:  Sternum protrusion noted, consistent with normal anatomical variation.      LABORATORY DATA:  I have reviewed the data as listed    Latest Ref Rng & Units 11/13/2023   10:06 AM 02/05/2023   10:15 AM 10/06/2022    9:09 AM  CBC  WBC 4.0 - 10.5 K/uL 7.2  4.6  5.7   Hemoglobin 12.0 - 15.0 g/dL 16.1  09.6  04.5   Hematocrit 36.0 - 46.0 % 39.6  37.0  37.1   Platelets 150 - 400 K/uL 325  302  297         Latest Ref Rng & Units 11/13/2023   10:06 AM 02/05/2023   10:15 AM 08/08/2022    9:09 AM  CMP  Glucose 70 - 99 mg/dL 98  409  81   BUN 6 - 20 mg/dL 16  12  10    Creatinine 0.44 - 1.00 mg/dL 8.11  9.14  0.86   Sodium 135 - 145 mmol/L 142  142  141   Potassium 3.5 - 5.1 mmol/L 3.9  3.7  3.6   Chloride 98 - 111 mmol/L 108  109  107   CO2 22 - 32 mmol/L 29  27  30    Calcium 8.9 - 10.3 mg/dL 9.6  9.5  9.2   Total Protein 6.5 - 8.1 g/dL 6.8  6.8  7.1   Total Bilirubin 0.0 - 1.2 mg/dL 0.6  0.5  0.4   Alkaline Phos 38 - 126 U/L 80  60  69   AST 15 - 41 U/L 16  17  18    ALT 0 - 44 U/L 12  14  19        RADIOGRAPHIC STUDIES: I have personally reviewed the radiological images as listed and agreed with the findings in the report. No results found.    No orders of the defined types were placed in this encounter.  All questions were answered. The patient knows to call the clinic with any problems, questions or concerns. No barriers to learning was detected. The total time spent in the appointment was 25 minutes.     Malachy Mood, MD 11/13/2023

## 2023-11-13 NOTE — Assessment & Plan Note (Signed)
Stage IA, p(T1c, N0), ER+/PR+/HER2-, Grade 2  -presented with palpable right breast lump x6 months. Biopsy 08/20/21 showed IDC, grade 2, Ki67 of 15%. -she was started on tamoxifen neoadjuvantly due to heavy menses. -S/p right lumpectomy on 10/11/21 by Dr. Carolynne Edouard showed 1.4 cm invasive and in situ ductal carcinoma. Margins and lymph nodes negative. -oncotype RS of 16, low risk, Adjuvant chemo is not recommended  -She resumed tamoxifen in early 10/2021, held 02/2022 for depression/mood swings. Effexor started and depression improved. Resumed low dose tamoxifen 10 mg 05/05/22, tolerating better -Mammogram 09/23/2022 was benign, breast density category B -She underwent total hysterectomy and BSO for heavy bleeding in 09/2022, all path was benign.  She recovered well -Due to postmenopausal status, she started Anastrozole 02/25/23, but stopped after 1 week due to severe nausea. She changed back to Tamoxifen late 02/2023

## 2023-11-25 ENCOUNTER — Encounter (HOSPITAL_BASED_OUTPATIENT_CLINIC_OR_DEPARTMENT_OTHER): Payer: Self-pay | Admitting: Physical Therapy

## 2023-11-25 ENCOUNTER — Ambulatory Visit (HOSPITAL_BASED_OUTPATIENT_CLINIC_OR_DEPARTMENT_OTHER): Payer: BC Managed Care – PPO | Admitting: Physical Therapy

## 2023-11-25 DIAGNOSIS — M25512 Pain in left shoulder: Secondary | ICD-10-CM

## 2023-11-25 DIAGNOSIS — M6281 Muscle weakness (generalized): Secondary | ICD-10-CM

## 2023-11-25 DIAGNOSIS — M25611 Stiffness of right shoulder, not elsewhere classified: Secondary | ICD-10-CM

## 2023-11-25 DIAGNOSIS — R29898 Other symptoms and signs involving the musculoskeletal system: Secondary | ICD-10-CM

## 2023-11-25 DIAGNOSIS — M25612 Stiffness of left shoulder, not elsewhere classified: Secondary | ICD-10-CM | POA: Diagnosis not present

## 2023-11-25 NOTE — Therapy (Signed)
OUTPATIENT PHYSICAL THERAPY TREATMENT   Patient Name: Emily Montoya MRN: [id number] DOB:Feb 07, 1971, 53 y.o., female Today's Date: 11/25/2023  END OF SESSION:  PT End of Session - 11/25/23 0717     Visit Number 2    Number of Visits 16   1-2x/week for 8 weeks   Date for PT Re-Evaluation 12/30/23    Authorization Type Primary [employer]; secondary [employer] medicaid    Progress Note Due on Visit 10    PT Start Time 0715    PT Stop Time 0753    PT Time Calculation (min) 38 min    Activity Tolerance Patient tolerated treatment well    Behavior During Therapy [facility] for tasks assessed/performed             Past Medical History:  Diagnosis Date   Anemia 09/2021   Anxiety December 18, 2021   Follows w/ Georgiann Mohs, therapist. Currently taking Effexor.   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 Malachy Mood, LOV 08/08/22 in Epic   Cervical radiculopathy 11/11/2021   Heart murmur    as a child   History of kidney stones December 18, 2020   passed on own   Lymphedema 2021-12-18   right breast   Personal history of radiation therapy    January through March 2023, right breast cancer   Pneumonia 05/2022   developed 2 weeks post-op neck surgery, treated w/ antibiotics and resolved per pt   Shoulder pain, right    Pt developed right shoulder pain after lymph node dissection surgery in 09/2021. S/p steroid injections x 4 in 2021/12/18.   Sickle cell trait (HCC)    Wears contact lenses    Wears glasses    Past Surgical History:  Procedure Laterality Date   BREAST BIOPSY Right 07/2021   BREAST LUMPECTOMY Right 09/2021   BREAST LUMPECTOMY WITH RADIOACTIVE SEED AND SENTINEL LYMPH NODE BIOPSY Right 10/11/2021   Procedure: RIGHT BREAST LUMPECTOMY WITH RADIOACTIVE SEED LOCALIZATION AND SENTINEL LYMPH NODE BIOPSY;  Surgeon: Griselda Miner, MD;  Location: [facility];   Service: General;  Laterality: Right;   CESAREAN SECTION  12-18-01   ENDOMETRIAL BIOPSY  08/25/2022   benign proliferative endometrium, negative for hyperplasia or malignancy   NECK SURGERY  05/2022   Dr. Marikay Alar   ROBOTIC ASSISTED LAPAROSCOPIC HYSTERECTOMY AND SALPINGECTOMY Bilateral 10/08/2022   Procedure: XI ROBOTIC ASSISTED LAPAROSCOPIC HYSTERECTOMY AND SALPINGO-OOPHORECTOMY;  Surgeon: Steva Ready, DO;  Location: [facility];  Service: Gynecology;  Laterality: Bilateral;   TUBAL LIGATION  Dec 18, 2001   Patient Active Problem List   Diagnosis Date Noted   Fibroids 09/30/2022   Abnormal uterine bleeding (AUB) 09/30/2022   Genetic testing 09/23/2021   Malignant neoplasm of upper-inner quadrant of right breast in female, estrogen receptor positive (HCC) 08/29/2021    PCP: Georgann Housekeeper MD  REFERRING PROVIDER: Hurman Horn, MD  REFERRING DIAG: M75.02 (ICD-10-CM) - Adhesive capsulitis of left shoulder  THERAPY DIAG:  Stiffness of left shoulder, not elsewhere classified  Other symptoms and signs involving the musculoskeletal system  Muscle weakness (generalized)  Left shoulder pain, unspecified chronicity  Stiffness of right shoulder, not elsewhere classified  Rationale for Evaluation and Treatment: Rehabilitation  ONSET DATE: 4-5 month  SUBJECTIVE:  SUBJECTIVE STATEMENT: Patient states HEP going well. Overslept and missed last appointment. Too early to tell if things are getting better.   EVAL: Patient states problems with both arms. R shoulder has been getting better and that's where they took the lymph nodes out. She was having issues with the discs in the neck causing L arm symptoms. Issues have returned in LUE since surgery. Still trouble raising the arm. Pain with dressing. Had  a shot in her shoulder in about December. Had a frozen shoulder on R after the lymph nodes.  Hand dominance: Right  PERTINENT HISTORY: R breast cancer ER+PR+, R breast lumpectomy and SLNB (0/7) on 10/11/21 , Cervical surgery on 06/13/2022, chronic shoulder pain, hx R frozen shoulder  PAIN:  Are you having pain? No at rest  PRECAUTIONS: None  RED FLAGS: None   WEIGHT BEARING RESTRICTIONS: No  FALLS:  Has patient fallen in last 6 months? No  OCCUPATION: Pharmacy tech  PLOF: Independent  PATIENT GOALS: needs her arm back so she can reach   OBJECTIVE: (objective measures from initial evaluation unless otherwise dated)  PATIENT SURVEYS:  FOTO 49% function  COGNITION: Overall cognitive status: Within functional limits for tasks assessed     SENSATION: WFL  POSTURE: rounded shoulders and forward head   UPPER EXTREMITY ROM:   Active ROM Right eval Left eval  Shoulder flexion 137  145 PROM 78 115 PROM  Shoulder extension    Shoulder abduction 115 70  Shoulder adduction    Shoulder internal rotation (functional) T5 PSIS  Shoulder external rotation (functional) T5 Scapular spine on L  Elbow flexion    Elbow extension    Wrist flexion    Wrist extension    Wrist ulnar deviation    Wrist radial deviation    Wrist pronation    Wrist supination    (Blank rows = not tested) *=pain/symptoms  UPPER EXTREMITY MMT:  MMT Right eval Left eval  Shoulder flexion 4+ 4 (in limited range)  Shoulder extension    Shoulder abduction 4+ 3+ (in limited range)  Shoulder adduction    Shoulder internal rotation 4+ 4+  Shoulder external rotation 4+ 4  Middle trapezius    Lower trapezius    Elbow flexion    Elbow extension    Wrist flexion    Wrist extension    Wrist ulnar deviation    Wrist radial deviation    Wrist pronation    Wrist supination    Grip strength (lbs)    (Blank rows = not tested) *=pain/symptoms    JOINT MOBILITY TESTING:  Hypomobile bilaterally  L>R  PALPATION:  Grossly TTP L shoulder/bicep/tricep   TODAY'S TREATMENT:                                                                                                                                         DATE:  11/25/23 Supine AAROM flexion  with wand 2 x 10 Supine AAROM ER with wand 2 x 10 Manual: PROM L shoulder flexion, abd, ER; Grade II-III L GHJ inferior glide in progressive flexion Supine shoulder flexion with band in hands RTB 2 x 10 Supine bilateral shoulder ER RTB 2 x 10 Supine shoulder horizontal abduction RTB 2 x 10 Standing Row RTB 2 x 10 Standing shoulder extension RTB 2 x 10  11/04/23 Supine AAROM flexion with wand 2 x 10 Supine AAROM ER with wand 2 x 10  PATIENT EDUCATION:  Education details: Patient educated on exam findings, POC, scope of PT, HEP, and anatomy and PT progression.11/25/23: HEP Person educated: Patient Education method: Explanation, Demonstration, and Handouts Education comprehension: verbalized understanding, returned demonstration, verbal cues required, and tactile cues required  HOME EXERCISE PROGRAM: Access Code: [email or web address] URL: [email or web address]   ASSESSMENT:  CLINICAL IMPRESSION: Continued with previously completed exercises with cueing for mechanics and positioning. Patient continues to lack ROM L>R. ROM improves slightly with manual. Shoulder and periscap strengthening tolerated well. Patient will continue to benefit from physical therapy in order to improve function and reduce impairment.   OBJECTIVE IMPAIRMENTS: decreased activity tolerance, decreased mobility, decreased ROM, decreased strength, hypomobility, increased muscle spasms, impaired flexibility, improper body mechanics, postural dysfunction, and pain.   ACTIVITY LIMITATIONS: carrying, lifting, bending, bathing, dressing, reach over head, hygiene/grooming, and caring for others  PARTICIPATION LIMITATIONS: meal prep, cleaning, laundry, driving, shopping,  community activity, occupation, and yard work  PERSONAL FACTORS: Fitness, Past/current experiences, Time since onset of injury/illness/exacerbation, and 3+ comorbidities: R breast cancer ER+PR+, R breast lumpectomy and SLNB (0/7) on 10/11/21 , Cervical surgery on 06/13/2022, chronic shoulder pain, hx R frozen shoulder  are also affecting patient's functional outcome.   REHAB POTENTIAL: Good  CLINICAL DECISION MAKING: Stable/uncomplicated  EVALUATION COMPLEXITY: Low   GOALS: Goals reviewed with patient? Yes  SHORT TERM GOALS: Target date: 12/02/2023    Patient will be independent with HEP in order to improve functional outcomes. Baseline: Goal status: INITIAL  2.  Patient will report at least 25% improvement in symptoms for improved quality of life. Baseline:  Goal status: INITIAL  3.  Patient will demonstrate at least 110 degrees of L shoulder AROM flexion for improved ability to reach overhead.  Baseline:  Goal status: INITIAL    LONG TERM GOALS: Target date: 12/30/2023    Patient will report at least 75% improvement in symptoms for improved quality of life. Baseline:  Goal status: MET  2.  Patient will improve FOTO score to predicted outcomes in order to indicate improved tolerance to activity. Baseline:  Goal status: INITIAL  3.  Patient will demonstrate at least 140 in shoulder AROM in L shoulder flexion for improved ability lift overhead. Baseline:  Goal status: INITIAL  4.  Patient will demonstrate grade of 5/5 MMT grade in all tested musculature as evidence of improved strength to assist with lifting at home. Baseline:  Goal status: INITIAL   PLAN:  PT FREQUENCY: 1-2x/week  PT DURATION: 8 weeks  PLANNED INTERVENTIONS: 97164- PT Re-evaluation, 97110-Therapeutic exercises, 97530- Therapeutic activity, 97112- Neuromuscular re-education, 97535- Self Care, 54098- Manual therapy, [phone]- Gait training, [phone]- Orthotic Fit/training, [phone]- Canalith repositioning,  [id number]- Aquatic Therapy, [phone]- Splinting, Patient/Family education, Balance training, Stair training, Taping, Dry Needling, Joint mobilization, Joint manipulation, Spinal manipulation, Spinal mobilization, Scar mobilization, and DME instructions.   PLAN FOR NEXT SESSION: L shoulder mobility and strength, manual for mobility and pain if needed. Thoracic mobility   Reola Mosher Adeena Bernabe, PT 11/25/2023, 7:52  AM

## 2023-12-01 ENCOUNTER — Ambulatory Visit (HOSPITAL_BASED_OUTPATIENT_CLINIC_OR_DEPARTMENT_OTHER): Payer: BC Managed Care – PPO | Attending: Sports Medicine

## 2023-12-01 DIAGNOSIS — R29898 Other symptoms and signs involving the musculoskeletal system: Secondary | ICD-10-CM | POA: Insufficient documentation

## 2023-12-01 DIAGNOSIS — M25512 Pain in left shoulder: Secondary | ICD-10-CM | POA: Insufficient documentation

## 2023-12-01 DIAGNOSIS — M25612 Stiffness of left shoulder, not elsewhere classified: Secondary | ICD-10-CM | POA: Insufficient documentation

## 2023-12-01 DIAGNOSIS — M6281 Muscle weakness (generalized): Secondary | ICD-10-CM | POA: Insufficient documentation

## 2023-12-01 DIAGNOSIS — M25611 Stiffness of right shoulder, not elsewhere classified: Secondary | ICD-10-CM | POA: Insufficient documentation

## 2023-12-03 ENCOUNTER — Ambulatory Visit (HOSPITAL_BASED_OUTPATIENT_CLINIC_OR_DEPARTMENT_OTHER): Payer: BC Managed Care – PPO

## 2023-12-08 ENCOUNTER — Telehealth (HOSPITAL_BASED_OUTPATIENT_CLINIC_OR_DEPARTMENT_OTHER): Payer: Self-pay

## 2023-12-08 ENCOUNTER — Ambulatory Visit (HOSPITAL_BASED_OUTPATIENT_CLINIC_OR_DEPARTMENT_OTHER): Payer: BC Managed Care – PPO

## 2023-12-08 NOTE — Telephone Encounter (Signed)
Called pt and left VM regarding missed appt. Reminded of next scheduled appt.

## 2023-12-11 ENCOUNTER — Ambulatory Visit (HOSPITAL_BASED_OUTPATIENT_CLINIC_OR_DEPARTMENT_OTHER): Payer: BC Managed Care – PPO

## 2023-12-12 ENCOUNTER — Telehealth (HOSPITAL_BASED_OUTPATIENT_CLINIC_OR_DEPARTMENT_OTHER): Payer: Self-pay

## 2023-12-12 ENCOUNTER — Encounter (HOSPITAL_BASED_OUTPATIENT_CLINIC_OR_DEPARTMENT_OTHER): Payer: Self-pay

## 2023-12-12 NOTE — Telephone Encounter (Signed)
Called pt and left VM regarding her no showed appointments. Made pt aware that per our attendance policy, we will be cancelling her remaining visits and if she would like to continue with PT she will need to provide a new MD referral.

## 2023-12-15 ENCOUNTER — Encounter (HOSPITAL_BASED_OUTPATIENT_CLINIC_OR_DEPARTMENT_OTHER): Payer: BC Managed Care – PPO

## 2023-12-18 ENCOUNTER — Encounter (HOSPITAL_BASED_OUTPATIENT_CLINIC_OR_DEPARTMENT_OTHER): Payer: BC Managed Care – PPO

## 2023-12-21 ENCOUNTER — Encounter (HOSPITAL_BASED_OUTPATIENT_CLINIC_OR_DEPARTMENT_OTHER): Payer: BC Managed Care – PPO

## 2023-12-23 ENCOUNTER — Encounter (HOSPITAL_BASED_OUTPATIENT_CLINIC_OR_DEPARTMENT_OTHER): Payer: BC Managed Care – PPO | Admitting: Physical Therapy

## 2023-12-29 ENCOUNTER — Encounter (HOSPITAL_BASED_OUTPATIENT_CLINIC_OR_DEPARTMENT_OTHER): Payer: BC Managed Care – PPO

## 2023-12-31 ENCOUNTER — Encounter (HOSPITAL_BASED_OUTPATIENT_CLINIC_OR_DEPARTMENT_OTHER): Payer: BC Managed Care – PPO

## 2024-01-05 ENCOUNTER — Encounter (HOSPITAL_BASED_OUTPATIENT_CLINIC_OR_DEPARTMENT_OTHER): Payer: BC Managed Care – PPO

## 2024-01-07 ENCOUNTER — Encounter (HOSPITAL_BASED_OUTPATIENT_CLINIC_OR_DEPARTMENT_OTHER): Payer: BC Managed Care – PPO

## 2024-04-30 ENCOUNTER — Emergency Department (HOSPITAL_BASED_OUTPATIENT_CLINIC_OR_DEPARTMENT_OTHER)
Admission: EM | Admit: 2024-04-30 | Discharge: 2024-04-30 | Disposition: A | Discharge status: Home / Self Care | Attending: Emergency Medicine | Admitting: Emergency Medicine

## 2024-04-30 ENCOUNTER — Encounter (HOSPITAL_BASED_OUTPATIENT_CLINIC_OR_DEPARTMENT_OTHER): Payer: Self-pay | Admitting: Urology

## 2024-04-30 ENCOUNTER — Emergency Department (HOSPITAL_BASED_OUTPATIENT_CLINIC_OR_DEPARTMENT_OTHER)

## 2024-04-30 DIAGNOSIS — Z87891 Personal history of nicotine dependence: Secondary | ICD-10-CM | POA: Insufficient documentation

## 2024-04-30 DIAGNOSIS — N3001 Acute cystitis with hematuria: Secondary | ICD-10-CM | POA: Insufficient documentation

## 2024-04-30 DIAGNOSIS — Z853 Personal history of malignant neoplasm of breast: Secondary | ICD-10-CM | POA: Insufficient documentation

## 2024-04-30 DIAGNOSIS — K8020 Calculus of gallbladder without cholecystitis without obstruction: Secondary | ICD-10-CM | POA: Diagnosis not present

## 2024-04-30 DIAGNOSIS — R319 Hematuria, unspecified: Secondary | ICD-10-CM | POA: Diagnosis not present

## 2024-04-30 DIAGNOSIS — K429 Umbilical hernia without obstruction or gangrene: Secondary | ICD-10-CM | POA: Diagnosis not present

## 2024-04-30 DIAGNOSIS — Z87442 Personal history of urinary calculi: Secondary | ICD-10-CM | POA: Insufficient documentation

## 2024-04-30 DIAGNOSIS — N200 Calculus of kidney: Secondary | ICD-10-CM | POA: Diagnosis not present

## 2024-04-30 LAB — COMPREHENSIVE METABOLIC PANEL WITH GFR
ALT: 20 U/L (ref 0–44)
AST: 24 U/L (ref 15–41)
Albumin: 4.3 g/dL (ref 3.5–5.0)
Alkaline Phosphatase: 89 U/L (ref 38–126)
Anion gap: 11 (ref 5–15)
BUN: 12 mg/dL (ref 6–20)
CO2: 26 mmol/L (ref 22–32)
Calcium: 9.7 mg/dL (ref 8.9–10.3)
Chloride: 103 mmol/L (ref 98–111)
Creatinine, Ser: 0.93 mg/dL (ref 0.44–1.00)
GFR, Estimated: >60 mL/min (ref >60)
Glucose, Bld: 99 mg/dL (ref 70–99)
Potassium: 3.6 mmol/L (ref 3.5–5.1)
Sodium: 140 mmol/L (ref 135–145)
Total Bilirubin: 0.5 mg/dL (ref 0.0–1.2)
Total Protein: 7.1 g/dL (ref 6.5–8.1)

## 2024-04-30 LAB — CBC
HCT: 39.6 % (ref 36.0–46.0)
Hemoglobin: 12.9 g/dL (ref 12.0–15.0)
MCH: 26.2 pg (ref 26.0–34.0)
MCHC: 32.6 g/dL (ref 30.0–36.0)
MCV: 80.3 fL (ref 80.0–100.0)
Platelets: 365 K/uL (ref 150–400)
RBC: 4.93 MIL/uL (ref 3.87–5.11)
RDW: 14.6 % (ref 11.5–15.5)
WBC: 7 K/uL (ref 4.0–10.5)
nRBC: 0 % (ref 0.0–0.2)

## 2024-04-30 LAB — URINALYSIS, MICROSCOPIC (REFLEX)

## 2024-04-30 LAB — URINALYSIS, ROUTINE W REFLEX MICROSCOPIC
Bilirubin Urine: NEGATIVE
Glucose, UA: NEGATIVE mg/dL
Ketones, ur: NEGATIVE mg/dL
Nitrite: POSITIVE — AB
Protein, ur: 30 mg/dL — AB
Specific Gravity, Urine: 1.015 (ref 1.005–1.030)
pH: 7 (ref 5.0–8.0)

## 2024-04-30 MED ORDER — SODIUM CHLORIDE 0.9 % IV SOLN
1.0000 g | Freq: Once | INTRAVENOUS | Status: AC
  Administered 2024-04-30: 1 g via INTRAVENOUS

## 2024-04-30 MED ORDER — PHENAZOPYRIDINE HCL 200 MG PO TABS: 200.0000 mg | ORAL_TABLET | Freq: Three times a day (TID) | ORAL | 0 refills | Status: AC | PRN

## 2024-04-30 MED ORDER — CEFADROXIL 500 MG PO CAPS: 500.0000 mg | ORAL_CAPSULE | Freq: Two times a day (BID) | ORAL | 0 refills | Status: AC

## 2024-04-30 NOTE — ED Triage Notes (Signed)
 Pt states noticed blood in her urine last night and again this morning  States slight migraine  No pain with urination at this time   H/o breast CA and hysterectomy

## 2024-04-30 NOTE — ED Provider Notes (Signed)
 [place] [facility] Provider Note   CSN: [id number] Arrival date & time: 04/30/24  1325     Patient presents with: Hematuria   Emily Montoya is a 53 y.o. female.    Hematuria   53 year old female presents emergency department complaints of hematuria, urinary frequency, urinary urgency.  States that she first noticed bloody Urine yesterday which persisted to this morning prompting visit to the ED.  States that her urine sample in the ED was not as bloody in appearance as the past 2 times.  States that last week, was having intermittent left lower abdominal pain.  States that this would not necessarily radiate.  States that it has been on and off ever since symptom onset.  Does report history of kidney stones and states this feels vaguely similar although not as intense as other kidney stone she has had in the past.  Denies any current pain.  Denies any fevers, chills, vaginal symptoms, change in bowel habits.  Denies any blood thinner use.  Past medical history significant for breast cancer, kidney stone, sickle cell trait, lymphedema, cervical uropathy  Prior to Admission medications   Medication Sig Start Date End Date Taking? Authorizing Provider  gabapentin  (NEURONTIN ) 100 MG capsule Take 100 mg by mouth 3 (three) times daily.    Curvin Mt III, MD  gabapentin  (NEURONTIN ) 300 MG capsule Take 1 capsule (300 mg total) by mouth at bedtime. (And take 1-2 of the gabapentin  100 mg capsules from the other rx every AM and afternoon) 09/13/23   Vonna, Sharlet POUR, MD  ketorolac  (TORADOL ) 10 MG tablet Take 1 tablet (10 mg total) by mouth every 6 (six) hours as needed (pain). 09/13/23   Vonna Sharlet POUR, MD  tamoxifen  (NOLVADEX ) 20 MG tablet Take 1 tablet (20 mg total) by mouth daily. 11/13/23   Lanny Callander, MD  venlafaxine  XR (EFFEXOR -XR) 75 MG 24 hr capsule Take 1 capsule (75 mg total) by mouth daily. 06/24/23   Burton, Lacie K, NP    Allergies:  Meloxicam    Review of Systems  Genitourinary:  Positive for hematuria.  All other systems reviewed and are negative.   Updated Vital Signs BP 132/89 (BP Location: Left Arm)   Pulse 70   Temp 98 F (36.7 C) (Oral)   Resp 18   Ht 5' 2 (1.575 m)   Wt 71.8 kg   LMP 09/06/2022 (Approximate) Comment: irregular periods  SpO2 100%   BMI 28.95 kg/m   Physical Exam Vitals and nursing note reviewed.  Constitutional:      General: She is not in acute distress.    Appearance: She is well-developed.  HENT:     Head: Normocephalic and atraumatic.  Eyes:     Conjunctiva/sclera: Conjunctivae normal.  Cardiovascular:     Rate and Rhythm: Normal rate and regular rhythm.     Heart sounds: No murmur heard. Pulmonary:     Effort: Pulmonary effort is normal. No respiratory distress.     Breath sounds: Normal breath sounds.  Abdominal:     Palpations: Abdomen is soft.     Tenderness: There is no right CVA tenderness or left CVA tenderness.     Comments: Mild tenderness left lower quadrant of abdomen.  Musculoskeletal:        General: No swelling.     Cervical back: Neck supple.  Skin:    General: Skin is warm and dry.     Capillary Refill: Capillary refill takes less than  2 seconds.  Neurological:     Mental Status: She is alert.  Psychiatric:        Mood and Affect: Mood normal.     (all labs ordered are listed, but only abnormal results are displayed) Labs Reviewed  URINALYSIS, ROUTINE W REFLEX MICROSCOPIC - Abnormal; Notable for the following components:      Result Value   APPearance CLOUDY (*)    Hgb urine dipstick MODERATE (*)    Protein, ur 30 (*)    Nitrite POSITIVE (*)    Leukocytes,Ua LARGE (*)    All other components within normal limits  URINALYSIS, MICROSCOPIC (REFLEX) - Abnormal; Notable for the following components:   Bacteria, UA MANY (*)    All other components within normal limits  URINE CULTURE  CBC  COMPREHENSIVE METABOLIC PANEL WITH GFR     EKG: None  Radiology: No results found.   Procedures   Medications Ordered in the ED  cefTRIAXone  (ROCEPHIN ) 1 g in sodium chloride  0.9 % 100 mL IVPB (has no administration in time range)                                    Medical Decision Making Amount and/or Complexity of Data Reviewed Labs: ordered. Radiology: ordered.   This patient presents to the ED for concern of hematuria, this involves an extensive number of treatment options, and is a complaint that carries with it a high risk of complications and morbidity.  The differential diagnosis includes UTI, bladder mass, malignancy, pyelonephritis, nephrolithiasis, other   Co morbidities that complicate the patient evaluation  See HPI   Additional history obtained:  Additional history obtained from EMR External records from outside source obtained and reviewed including hospital records   Lab Tests:  I Ordered, and personally interpreted labs.  The pertinent results include: UA with cloudy appearance, moderate hemoglobin, 30 protein, nitrite, leukocyte positive with many bacteria 6-10 RBCs with 21-50 WBCs.  WBC cast present.  No leukocytosis.  No evidence of anemia.  Platelets within normal range.  No electrolyte abnormalities.  No transaminitis.  No renal dysfunction..  Urine culture pending.   Imaging Studies ordered:  I ordered imaging studies including CT renal study I independently visualized and interpreted imaging which showed bilateral staghorn calculi with multiple additional stones in right kidney.  Mild chronic upper pole calyceal dilation of right.  7 mm stone posterior right bladder near at UPJ.  No upstream hydroureter ureter or frank hydronephrosis.  Gallstones.  Aortic atherosclerosis. I agree with the radiologist interpretation  Cardiac Monitoring: / EKG:  The patient was maintained on a cardiac monitor.  I personally viewed and interpreted the cardiac monitored which showed an underlying  rhythm of: [place] rhythm   Consultations Obtained:  N/a   Problem List / ED Course / Critical interventions / Medication management  UTI I ordered medication including Rocephin    Reevaluation of the patient after these medicines showed that the patient improved I have reviewed the patients home medicines and have made adjustments as needed   Social Determinants of Health:  Former cigarette use.  Denies illicit drug use.   Test / Admission - Considered:  UTI Vitals signs within normal range and stable throughout visit. Laboratory/imaging studies significant for: See above 53 year old female presents emergency department complaints of hematuria, urinary frequency, urinary urgency.  States that she first noticed bloody Urine yesterday which persisted to this morning prompting visit  to the ED.  States that her urine sample in the ED was not as bloody in appearance as the past 2 times.  States that last week, was having intermittent left lower abdominal pain.  States that this would not necessarily radiate.  States that it has been on and off ever since symptom onset.  Does report history of kidney stones and states this feels vaguely similar although not as intense as other kidney stone she has had in the past.  Denies any current pain.  Denies any fevers, chills, vaginal symptoms, change in bowel habits.  Denies any blood thinner use. On exam, slight tenderness appreciated in left lower quadrant abdomen.  Auscultation bilateral.  Labs concerning for significant UTI as above.  Otherwise, labs negative for any acute emergent pathology.  CT imaging is obtained given patient's intermittent left-sided abdominal pain which was reassuring from an acute perspective.  Did show evidence of staghorn calculi as above with 1 stone present in the bladder, which seems to be chronic without any acute change.  Otherwise, CT imaging reassuring.  Will place patient empirically on antibiotics and culture urine.   Working follow-up with urology in outpatient setting given amount of stone burden in kidneys.  Treatment plan discussed with patient and she acknowledged understanding was agreeable to said plan.  Patient well-appearing, afebrile in no acute distress. Worrisome signs and symptoms were discussed with the patient, and the patient acknowledged understanding to return to the ED if noticed. Patient was stable upon discharge.       Final diagnoses:  None    ED Discharge Orders     None          Silver Wonda LABOR, [place] 04/30/24 1519    Pamella Ozell LABOR, DO 05/01/24 1109

## 2024-04-30 NOTE — Discharge Instructions (Addendum)
 As discussed, you do have kidney stones in the kidney not causing obstruction or problems at this time.  Your labs were reassuring besides your urine which showed infection.  I suspect that your symptoms are likely coming from a urine infection.  Will put on antibiotics for this as well as send in Pyridium  to take for discomfort.  Given your stones in your kidneys, will recommend follow-up with urology in the outpatient setting for reassessment.  Please do not hesitate to return to the emergency department if the worrisome signs and symptoms we discussed become apparent.

## 2024-05-02 LAB — URINE CULTURE

## 2024-05-11 ENCOUNTER — Other Ambulatory Visit: Payer: Self-pay | Admitting: Nurse Practitioner

## 2024-05-11 DIAGNOSIS — Z170 Estrogen receptor positive status [ER+]: Secondary | ICD-10-CM

## 2024-05-11 NOTE — Assessment & Plan Note (Deleted)
 Stage IA, p(T1c, N0), ER+/PR+/HER2-, Grade 2  -presented with palpable right breast lump x6 months. Biopsy 08/20/21 showed IDC, grade 2, Ki67 of 15%. -she was started on tamoxifen  neoadjuvantly due to heavy menses. -S/p right lumpectomy on 10/11/21 by Dr. Curvin showed 1.4 cm invasive and in situ ductal carcinoma. Margins and lymph nodes negative. -oncotype RS of 16, low risk, Adjuvant chemo is not recommended  -She resumed tamoxifen  in early 10/2021, held 02/2022 for depression/mood swings. Effexor  started and depression improved. Resumed low dose tamoxifen  10 mg 05/05/22, tolerating better -Mammogram 09/23/2022 was benign, breast density category B -She underwent total hysterectomy and BSO for heavy bleeding in 09/2022, all path was benign.  She recovered well -Due to postmenopausal status, she started Anastrozole  02/25/23, but stopped after 1 week due to severe nausea. She changed back to Tamoxifen  late 02/2023 - Diagnostic mammogram 09/30/2023 -breast density category C.  Negative results overall.

## 2024-05-11 NOTE — Progress Notes (Deleted)
 Patient Care Team: Ransom Other, MD as PCP - General (Internal Medicine) Curvin Deward MOULD, MD as Consulting Physician (General Surgery) Lanny Callander, MD as Consulting Physician (Hematology) Tyree Nanetta SAILOR, RN as Oncology Nurse Navigator Glean, Stephane BROCKS, RN (Inactive) as Oncology Nurse Navigator Burton, Lacie K, NP as Nurse Practitioner (Nurse Practitioner)  Clinic Day:  05/11/2024  Referring physician: Ransom Other, MD  ASSESSMENT & PLAN:   Assessment & Plan: Malignant neoplasm of upper-inner quadrant of right breast in female, estrogen receptor positive (HCC) Stage IA, p(T1c, N0), ER+/PR+/HER2-, Grade 2  -presented with palpable right breast lump x6 months. Biopsy 08/20/21 showed IDC, grade 2, Ki67 of 15%. -she was started on tamoxifen  neoadjuvantly due to heavy menses. -S/p right lumpectomy on 10/11/21 by Dr. Curvin showed 1.4 cm invasive and in situ ductal carcinoma. Margins and lymph nodes negative. -oncotype RS of 16, low risk, Adjuvant chemo is not recommended  -She resumed tamoxifen  in early 10/2021, held 02/2022 for depression/mood swings. Effexor  started and depression improved. Resumed low dose tamoxifen  10 mg 05/05/22, tolerating better -Mammogram 09/23/2022 was benign, breast density category B -She underwent total hysterectomy and BSO for heavy bleeding in 09/2022, all path was benign.  She recovered well -Due to postmenopausal status, she started Anastrozole  02/25/23, but stopped after 1 week due to severe nausea. She changed back to Tamoxifen  late 02/2023 - Diagnostic mammogram 09/30/2023 -breast density category C.  Negative results overall.    The patient understands the plans discussed today and is in agreement with them.  She knows to contact our office if she develops concerns prior to her next appointment.  I provided *** minutes of face-to-face time during this encounter and > 50% was spent counseling as documented under my assessment and plan.    Powell FORBES Lessen, NP   [facility] - A DEPT OF [facility] [address] [place] [address] Dept: [phone] Dept Fax: [phone]   No orders of the defined types were placed in this encounter.     CHIEF COMPLAINT:  CC: Right breast cancer, estrogen receptor positive  Current Treatment: Tamoxifen  20 mg daily  INTERVAL HISTORY:  Emily Montoya is here today for repeat clinical assessment.  She last saw Dr. Lanny on 11/13/2023.  Diagnostic mammogram performed on 09/30/2023.  She does have category C breast density.  Results were negative.  Diagnostic mammogram recommended in 1 year.  She denies fevers or chills. She denies pain. Her appetite is good. Her weight {Weight change:10426}.  I have reviewed the past medical history, past surgical history, social history and family history with the patient and they are unchanged from previous note.  ALLERGIES:  is allergic to meloxicam.  MEDICATIONS:  Current Outpatient Medications  Medication Sig Dispense Refill   cefadroxil  (DURICEF) 500 MG capsule Take 1 capsule (500 mg total) by mouth 2 (two) times daily. 12 capsule 0   gabapentin  (NEURONTIN ) 100 MG capsule Take 100 mg by mouth 3 (three) times daily.     gabapentin  (NEURONTIN ) 300 MG capsule Take 1 capsule (300 mg total) by mouth at bedtime. (And take 1-2 of the gabapentin  100 mg capsules from the other rx every AM and afternoon) 30 capsule 0   ketorolac  (TORADOL ) 10 MG tablet Take 1 tablet (10 mg total) by mouth every 6 (six) hours as needed (pain). 20 tablet 0   phenazopyridine  (PYRIDIUM ) 200 MG tablet Take 1 tablet (200 mg total) by mouth 3 (three)  times daily as needed for pain. 6 tablet 0   tamoxifen  (NOLVADEX ) 20 MG tablet Take 1 tablet (20 mg total) by mouth daily. 30 tablet 3   venlafaxine  XR (EFFEXOR -XR) 75 MG 24 hr capsule Take 1 capsule (75 mg total) by mouth daily. 90 capsule 0   No current facility-administered medications for this  visit.    HISTORY OF PRESENT ILLNESS:   Oncology History Overview Note   Cancer Staging  Malignant neoplasm of upper-inner quadrant of right breast in female, estrogen receptor positive (HCC) Staging form: Breast, AJCC 8th Edition - Clinical stage from 08/20/2021: Stage IA (cT1c, cN0, cM0, G2, ER+, PR+, HER2-) - Signed by Lanny Callander, MD on 09/02/2021 Method of lymph node assessment: Clinical Histologic grading system: 3 grade system - Pathologic stage from 10/11/2021: Stage IA (pT1c, pN0, cM0, G2, ER+, PR+, HER2-, Oncotype DX score: 16) - Signed by Lanny Callander, MD on 01/08/2022 Stage prefix: Initial diagnosis Multigene prognostic tests performed: Oncotype DX Recurrence score range: Greater than or equal to 11 Histologic grading system: 3 grade system     Malignant neoplasm of upper-inner quadrant of right breast in female, estrogen receptor positive (HCC)  08/14/2021 Mammogram   EXAM: DIGITAL DIAGNOSTIC BILATERAL MAMMOGRAM WITH TOMOSYNTHESIS AND CAD; ULTRASOUND RIGHT BREAST LIMITED  IMPRESSION: 1. Suspicious right breast mass at the 1 o'clock position 15 cm from the nipple on the right. It measures 1.1 x 1.3 x 0.9 cm. Recommendation is for ultrasound-guided biopsy. 2. No suspicious right axillary lymphadenopathy. 3. No mammographic evidence of malignancy on the left.   08/20/2021 Pathology Results   Diagnosis Breast, right, needle core biopsy, 1 o'clock, 15 cmfn, ribbon clip - INVASIVE DUCTAL CARCINOMA - SEE COMMENT  Microscopic Comment Based on the biopsy, the carcinoma appears Nottingham grade 2 of 3 and measures 1.2 cm in greatest linear extent.  PROGNOSTIC INDICATORS Results: The tumor cells are NEGATIVE for Her2 (1+). Estrogen Receptor: 90%, POSITIVE, MODERATE STAINNG INTENSITY Progesterone Receptor: 90%, POSITIVE, STRONG STAINING INTENSITY Proliferation Marker Ki67: 15%   08/20/2021 Cancer Staging   Staging form: Breast, AJCC 8th Edition - Clinical stage from  08/20/2021: Stage IA (cT1c, cN0, cM0, G2, ER+, PR+, HER2-) - Signed by Lanny Callander, MD on 09/02/2021 Method of lymph node assessment: Clinical Histologic grading system: 3 grade system   08/29/2021 Initial Diagnosis   Malignant neoplasm of upper-inner quadrant of right breast in female, estrogen receptor positive (HCC)   09/22/2021 Genetic Testing   Negative genetic testing on the CancerNext-Expanded+RNAinsight panel.  The report date is September 20, 2021.  The CancerNext-Expanded gene panel offered by [facility] and includes sequencing and rearrangement analysis for the following 77 genes: AIP, ALK, APC*, ATM*, AXIN2, BAP1, BARD1, BLM, BMPR1A, BRCA1*, BRCA2*, BRIP1*, CDC73, CDH1*, CDK4, CDKN1B, CDKN2A, CHEK2*, CTNNA1, DICER1, FANCC, FH, FLCN, GALNT12, KIF1B, LZTR1, MAX, MEN1, MET, MLH1*, MSH2*, MSH3, MSH6*, MUTYH*, NBN, NF1*, NF2, NTHL1, PALB2*, PHOX2B, PMS2*, POT1, PRKAR1A, PTCH1, PTEN*, RAD51C*, RAD51D*, RB1, RECQL, RET, SDHA, SDHAF2, SDHB, SDHC, SDHD, SMAD4, SMARCA4, SMARCB1, SMARCE1, STK11, SUFU, TMEM127, TP53*, TSC1, TSC2, VHL and XRCC2 (sequencing and deletion/duplication); EGFR, EGLN1, HOXB13, KIT, MITF, PDGFRA, POLD1, and POLE (sequencing only); EPCAM and GREM1 (deletion/duplication only). DNA and RNA analyses performed for * genes.   10/11/2021 Cancer Staging   Staging form: Breast, AJCC 8th Edition - Pathologic stage from 10/11/2021: Stage IA (pT1c, pN0, cM0, G2, ER+, PR+, HER2-, Oncotype DX score: 16) - Signed by Lanny Callander, MD on 01/08/2022 Stage prefix: Initial diagnosis Multigene prognostic tests performed: Oncotype  DX Recurrence score range: Greater than or equal to 11 Histologic grading system: 3 grade system   10/11/2021 Definitive Surgery   FINAL MICROSCOPIC DIAGNOSIS:   A. LYMPH NODE, RIGHT AXILLARY #1, SENTINEL, EXCISION:  - One lymph node negative for metastatic carcinoma (0/1).   B. LYMPH NODE, RIGHT AXILLARY, SENTINEL, EXCISION:  - One lymph node negative for  metastatic carcinoma (0/1).   C. LYMPH NODE, RIGHT AXILLARY, SENTINEL, EXCISION:  - One lymph node negative for metastatic carcinoma (0/1).   D. LYMPH NODE, RIGHT AXILLARY #2, SENTINEL, EXCISION:  - One lymph node negative for metastatic carcinoma (0/1).   E. LYMPH NODE, RIGHT AXILLARY, SENTINEL, EXCISION:  - One lymph node negative for metastatic carcinoma (0/1).   F. BREAST, RIGHT, LUMPECTOMY:  - Invasive and in situ ductal carcinoma, 1.4 cm.  - Margins negative for carcinoma.  - Biopsy site and biopsy clip.  - See oncology table.    10/11/2021 Oncotype testing   Oncotype DX was obtained on the final surgical sample and the recurrence score of 16 predicts a risk of recurrence outside the breast over the next 9 years of 4%, if the patient's only systemic therapy is an antiestrogen for 5 years.  It also predicts no significant benefit from chemotherapy.    05/05/2022 Survivorship   SCP delivered by Lacie Burton, NP       REVIEW OF SYSTEMS:   Constitutional: Denies fevers, chills or abnormal weight loss Eyes: Denies blurriness of vision Ears, nose, mouth, throat, and face: Denies mucositis or sore throat Respiratory: Denies cough, dyspnea or wheezes Cardiovascular: Denies palpitation, chest discomfort or lower extremity swelling Gastrointestinal:  Denies nausea, heartburn or change in bowel habits Skin: Denies abnormal skin rashes Lymphatics: Denies new lymphadenopathy or easy bruising Neurological:Denies numbness, tingling or new weaknesses Behavioral/Psych: Mood is stable, no new changes  All other systems were reviewed with the patient and are negative.   VITALS:  Last menstrual period 09/06/2022.  Wt Readings from Last 3 Encounters:  04/30/24 158 lb 4.6 oz (71.8 kg)  11/13/23 158 lb 3.2 oz (71.8 kg)  02/05/23 161 lb 14.4 oz (73.4 kg)    There is no height or weight on file to calculate BMI.  Performance status (ECOG): {CHL ONC [id number]  PHYSICAL EXAM:    GENERAL:alert, no distress and comfortable SKIN: skin color, texture, turgor are normal, no rashes or significant lesions EYES: normal, Conjunctiva are pink and non-injected, sclera clear OROPHARYNX:no exudate, no erythema and lips, buccal mucosa, and tongue normal  NECK: supple, thyroid normal size, non-tender, without nodularity LYMPH:  no palpable lymphadenopathy in the cervical, axillary or inguinal LUNGS: clear to auscultation and percussion with normal breathing effort HEART: regular rate & rhythm and no murmurs and no lower extremity edema ABDOMEN:abdomen soft, non-tender and normal bowel sounds Musculoskeletal:no cyanosis of digits and no clubbing  NEURO: alert & oriented x 3 with fluent speech, no focal motor/sensory deficits  LABORATORY DATA:  I have reviewed the data as listed    Component Value Date/Time   NA 140 04/30/2024 1447   K 3.6 04/30/2024 1447   CL 103 04/30/2024 1447   CO2 26 04/30/2024 1447   GLUCOSE 99 04/30/2024 1447   BUN 12 04/30/2024 1447   CREATININE 0.93 04/30/2024 1447   CREATININE 0.84 11/13/2023 1006   CALCIUM 9.7 04/30/2024 1447   PROT 7.1 04/30/2024 1447   ALBUMIN 4.3 04/30/2024 1447   AST 24 04/30/2024 1447   AST 16 11/13/2023 1006   ALT 20  04/30/2024 1447   ALT 12 11/13/2023 1006   ALKPHOS 89 04/30/2024 1447   BILITOT 0.5 04/30/2024 1447   BILITOT 0.6 11/13/2023 1006   GFRNONAA >60 04/30/2024 1447   GFRNONAA >60 11/13/2023 1006   GFRAA >60 06/14/2019 0613    No results found for: SPEP, UPEP  Lab Results  Component Value Date   WBC 7.0 04/30/2024   NEUTROABS 4.9 11/13/2023   HGB 12.9 04/30/2024   HCT 39.6 04/30/2024   MCV 80.3 04/30/2024   PLT 365 04/30/2024      Chemistry      Component Value Date/Time   NA 140 04/30/2024 1447   K 3.6 04/30/2024 1447   CL 103 04/30/2024 1447   CO2 26 04/30/2024 1447   BUN 12 04/30/2024 1447   CREATININE 0.93 04/30/2024 1447   CREATININE 0.84 11/13/2023 1006      Component Value  Date/Time   CALCIUM 9.7 04/30/2024 1447   ALKPHOS 89 04/30/2024 1447   AST 24 04/30/2024 1447   AST 16 11/13/2023 1006   ALT 20 04/30/2024 1447   ALT 12 11/13/2023 1006   BILITOT 0.5 04/30/2024 1447   BILITOT 0.6 11/13/2023 1006       RADIOGRAPHIC STUDIES: I have personally reviewed the radiological images as listed and agreed with the findings in the report. CT Renal Stone Study Result Date: 04/30/2024 CLINICAL DATA:  Blood in urine flank pain EXAM: CT ABDOMEN AND PELVIS WITHOUT CONTRAST TECHNIQUE: Multidetector CT imaging of the abdomen and pelvis was performed following the standard protocol without IV contrast. RADIATION DOSE REDUCTION: This exam was performed according to the departmental dose-optimization program which includes automated exposure control, adjustment of the mA and/or kV according to patient size and/or use of iterative reconstruction technique. COMPARISON:  CT 03/19/2021 FINDINGS: Lower chest: Lung bases demonstrate no acute airspace disease. Hepatobiliary: Stable hepatic cyst. Small gallstones. No biliary dilatation Pancreas: Unremarkable. No pancreatic ductal dilatation or surrounding inflammatory changes. Spleen: Normal in size without focal abnormality. Adrenals/Urinary Tract: Adrenal glands are normal. Mild atrophy and multifocal cortical scarring of right kidney. 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 There are multiple additional stones in the right kidney. Mild chronic upper pole calyceal dilatation on the right. No ureteral stone. 7 mm small stone in the right posterior bladder near or the UPJ. Stomach/Bowel: Stomach is within normal limits. Appendix appears normal. No evidence of bowel wall thickening, distention, or inflammatory changes. Vascular/Lymphatic: Aortic atherosclerosis. No enlarged abdominal or pelvic lymph nodes. Reproductive: Status post hysterectomy. No adnexal masses. Other: Negative for pelvic effusion or free air. Small fat containing umbilical  hernia Musculoskeletal: No acute or suspicious osseous abnormality IMPRESSION: 1. Interim finding of bilateral staghorn calculi with multiple additional stones in the right kidney. Mild chronic upper pole calyceal dilatation on the right. Atrophy and multifocal cortical scarring of the right kidney. 7 mm stone in the right posterior bladder, near or at the UPJ. No upstream hydroureter or frank hydronephrosis. 2. Gallstones 3. Aortic atherosclerosis. Aortic Atherosclerosis (ICD10-I70.0). Electronically Signed   By: Luke Bun M.D.   On: 04/30/2024 14:57

## 2024-05-12 ENCOUNTER — Inpatient Hospital Stay: Payer: BC Managed Care – PPO | Admitting: Nurse Practitioner

## 2024-05-12 ENCOUNTER — Other Ambulatory Visit: Payer: Self-pay

## 2024-05-12 ENCOUNTER — Inpatient Hospital Stay: Payer: BC Managed Care – PPO | Attending: Nurse Practitioner

## 2024-05-12 DIAGNOSIS — Z170 Estrogen receptor positive status [ER+]: Secondary | ICD-10-CM

## 2024-05-12 NOTE — Progress Notes (Signed)
 Called patient due to not showing up for her 10am lab app and her 1030am app with Powell Lessen NP. Left a VM message due to no one answering the phone. Will no show patient for todays app and reschedule.

## 2024-08-30 ENCOUNTER — Other Ambulatory Visit: Payer: Self-pay | Admitting: Nurse Practitioner

## 2024-08-30 DIAGNOSIS — N6310 Unspecified lump in the right breast, unspecified quadrant: Secondary | ICD-10-CM

## 2024-08-31 ENCOUNTER — Telehealth: Payer: Self-pay

## 2024-08-31 NOTE — Telephone Encounter (Signed)
 Called to scheduled pt and she is aware of appt times and date.

## 2024-10-07 ENCOUNTER — Encounter

## 2024-10-11 ENCOUNTER — Inpatient Hospital Stay: Admitting: Nurse Practitioner

## 2024-10-11 ENCOUNTER — Inpatient Hospital Stay

## 2024-10-17 ENCOUNTER — Encounter

## 2024-10-23 NOTE — Progress Notes (Deleted)
 "     [facility]   [phone]) [phone] [phone]    Patient Care Team: Ransom Other, MD as PCP - General (Internal Medicine) Curvin Deward MOULD, MD as Consulting Physician (General Surgery) Lanny Callander, MD as Consulting Physician (Hematology) Tyree Nanetta SAILOR, RN as Oncology Nurse Navigator Dariah Mcsorley K, NP as Nurse Practitioner (Nurse Practitioner)   CHIEF COMPLAINT:   Oncology History Overview Note   Cancer Staging  Malignant neoplasm of upper-inner quadrant of right breast in female, estrogen receptor positive [facility]) Staging form: Breast, AJCC 8th Edition - Clinical stage from 08/20/2021: Stage IA (cT1c, cN0, cM0, G2, ER+, PR+, HER2-) - Signed by Lanny Callander, MD on 09/02/2021 Method of lymph node assessment: Clinical Histologic grading system: 3 grade system - Pathologic stage from 10/11/2021: Stage IA (pT1c, pN0, cM0, G2, ER+, PR+, HER2-, Oncotype DX score: 16) - Signed by Lanny Callander, MD on 01/08/2022 Stage prefix: Initial diagnosis Multigene prognostic tests performed: Oncotype DX Recurrence score range: Greater than or equal to 11 Histologic grading system: 3 grade system     Malignant neoplasm of upper-inner quadrant of right breast in female, estrogen receptor positive (HCC)  08/14/2021 Mammogram   EXAM: DIGITAL DIAGNOSTIC BILATERAL MAMMOGRAM WITH TOMOSYNTHESIS AND CAD; ULTRASOUND RIGHT BREAST LIMITED  IMPRESSION: 1. Suspicious right breast mass at the 1 o'clock position 15 cm from the nipple on the right. It measures 1.1 x 1.3 x 0.9 cm. Recommendation is for ultrasound-guided biopsy. 2. No suspicious right axillary lymphadenopathy. 3. No mammographic evidence of malignancy on the left.   08/20/2021 Pathology Results   Diagnosis Breast, right, needle core biopsy, 1 o'clock, 15 cmfn, ribbon clip - INVASIVE DUCTAL CARCINOMA - SEE COMMENT  Microscopic Comment Based on the biopsy, the carcinoma appears Nottingham grade 2 of 3 and measures 1.2  cm in greatest linear extent.  PROGNOSTIC INDICATORS Results: The tumor cells are NEGATIVE for Her2 (1+). Estrogen Receptor: 90%, POSITIVE, MODERATE STAINNG INTENSITY Progesterone Receptor: 90%, POSITIVE, STRONG STAINING INTENSITY Proliferation Marker Ki67: 15%   08/20/2021 Cancer Staging   Staging form: Breast, AJCC 8th Edition - Clinical stage from 08/20/2021: Stage IA (cT1c, cN0, cM0, G2, ER+, PR+, HER2-) - Signed by Lanny Callander, MD on 09/02/2021 Method of lymph node assessment: Clinical Histologic grading system: 3 grade system   08/29/2021 Initial Diagnosis   Malignant neoplasm of upper-inner quadrant of right breast in female, estrogen receptor positive (HCC)   09/22/2021 Genetic Testing   Negative genetic testing on the CancerNext-Expanded+RNAinsight panel.  The report date is September 20, 2021.  The CancerNext-Expanded gene panel offered by [facility] and includes sequencing and rearrangement analysis for the following 77 genes: AIP, ALK, APC*, ATM*, AXIN2, BAP1, BARD1, BLM, BMPR1A, BRCA1*, BRCA2*, BRIP1*, CDC73, CDH1*, CDK4, CDKN1B, CDKN2A, CHEK2*, CTNNA1, DICER1, FANCC, FH, FLCN, GALNT12, KIF1B, LZTR1, MAX, MEN1, MET, MLH1*, MSH2*, MSH3, MSH6*, MUTYH*, NBN, NF1*, NF2, NTHL1, PALB2*, PHOX2B, PMS2*, POT1, PRKAR1A, PTCH1, PTEN*, RAD51C*, RAD51D*, RB1, RECQL, RET, SDHA, SDHAF2, SDHB, SDHC, SDHD, SMAD4, SMARCA4, SMARCB1, SMARCE1, STK11, SUFU, TMEM127, TP53*, TSC1, TSC2, VHL and XRCC2 (sequencing and deletion/duplication); EGFR, EGLN1, HOXB13, KIT, MITF, PDGFRA, POLD1, and POLE (sequencing only); EPCAM and GREM1 (deletion/duplication only). DNA and RNA analyses performed for * genes.   10/11/2021 Cancer Staging   Staging form: Breast, AJCC 8th Edition - Pathologic stage from 10/11/2021: Stage IA (pT1c, pN0, cM0, G2, ER+, PR+, HER2-, Oncotype DX score: 16) - Signed by Lanny Callander, MD on 01/08/2022 Stage prefix: Initial diagnosis Multigene prognostic tests performed: Oncotype  DX Recurrence  score range: Greater than or equal to 11 Histologic grading system: 3 grade system   10/11/2021 Definitive Surgery   FINAL MICROSCOPIC DIAGNOSIS:   A. LYMPH NODE, RIGHT AXILLARY #1, SENTINEL, EXCISION:  - One lymph node negative for metastatic carcinoma (0/1).   B. LYMPH NODE, RIGHT AXILLARY, SENTINEL, EXCISION:  - One lymph node negative for metastatic carcinoma (0/1).   C. LYMPH NODE, RIGHT AXILLARY, SENTINEL, EXCISION:  - One lymph node negative for metastatic carcinoma (0/1).   D. LYMPH NODE, RIGHT AXILLARY #2, SENTINEL, EXCISION:  - One lymph node negative for metastatic carcinoma (0/1).   E. LYMPH NODE, RIGHT AXILLARY, SENTINEL, EXCISION:  - One lymph node negative for metastatic carcinoma (0/1).   F. BREAST, RIGHT, LUMPECTOMY:  - Invasive and in situ ductal carcinoma, 1.4 cm.  - Margins negative for carcinoma.  - Biopsy site and biopsy clip.  - See oncology table.    10/11/2021 Oncotype testing   Oncotype DX was obtained on the final surgical sample and the recurrence score of 16 predicts a risk of recurrence outside the breast over the next 9 years of 4%, if the patient's only systemic therapy is an antiestrogen for 5 years.  It also predicts no significant benefit from chemotherapy.    05/05/2022 Survivorship   SCP delivered by Sharla Tankard, NP      CURRENT THERAPY:   INTERVAL HISTORY   ROS   Past Medical History:  Diagnosis Date   Anemia 09/2021   Anxiety 12-01-2021   Follows w/ Adina Sol, therapist. Currently taking Effexor .   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 Onita Mattock, LOV 08/08/22 in Epic   Cervical radiculopathy 11/11/2021   Heart murmur    as a child   History of kidney stones 2020-12-01   passed on own   Lymphedema 12/01/2021   right breast   Personal history of radiation therapy    2024/12/01 through March  2023, right breast cancer   Pneumonia 05/2022   developed 2 weeks post-op neck surgery, treated w/ antibiotics and resolved per pt   Shoulder pain, right    Pt developed right shoulder pain after lymph node dissection surgery in 09/2021. S/p steroid injections x 4 in 12-01-21.   Sickle cell trait    Wears contact lenses    Wears glasses      Past Surgical History:  Procedure Laterality Date   BREAST BIOPSY Right 07/2021   BREAST LUMPECTOMY Right 09/2021   BREAST LUMPECTOMY WITH RADIOACTIVE SEED AND SENTINEL LYMPH NODE BIOPSY Right 10/11/2021   Procedure: RIGHT BREAST LUMPECTOMY WITH RADIOACTIVE SEED LOCALIZATION AND SENTINEL LYMPH NODE BIOPSY;  Surgeon: Curvin Deward MOULD, MD;  Location: [facility];  Service: General;  Laterality: Right;   CESAREAN SECTION  12/01/01   ENDOMETRIAL BIOPSY  08/25/2022   benign proliferative endometrium, negative for hyperplasia or malignancy   NECK SURGERY  05/2022   Dr. Alm Molt   ROBOTIC ASSISTED LAPAROSCOPIC HYSTERECTOMY AND SALPINGECTOMY Bilateral 10/08/2022   Procedure: XI ROBOTIC ASSISTED LAPAROSCOPIC HYSTERECTOMY AND SALPINGO-OOPHORECTOMY;  Surgeon: Storm Setter, DO;  Location: [facility];  Service: Gynecology;  Laterality: Bilateral;   TUBAL LIGATION  2001-12-01     Outpatient Encounter Medications as of 10/25/2024  Medication Sig   cefadroxil  (DURICEF) 500 MG capsule Take 1 capsule (500  mg total) by mouth 2 (two) times daily.   gabapentin  (NEURONTIN ) 100 MG capsule Take 100 mg by mouth 3 (three) times daily.   gabapentin  (NEURONTIN ) 300 MG capsule Take 1 capsule (300 mg total) by mouth at bedtime. (And take 1-2 of the gabapentin  100 mg capsules from the other rx every AM and afternoon)   ketorolac  (TORADOL ) 10 MG tablet Take 1 tablet (10 mg total) by mouth every 6 (six) hours as needed (pain).   phenazopyridine  (PYRIDIUM ) 200 MG tablet Take 1 tablet (200 mg total) by mouth 3 (three) times daily as needed for pain.   tamoxifen   (NOLVADEX ) 20 MG tablet Take 1 tablet (20 mg total) by mouth daily.   venlafaxine  XR (EFFEXOR -XR) 75 MG 24 hr capsule Take 1 capsule (75 mg total) by mouth daily.   No facility-administered encounter medications on file as of 10/25/2024.     There were no vitals filed for this visit. There is no height or weight on file to calculate BMI.   ECOG PERFORMANCE STATUS: {CHL ONC ECOG PS:[phone]}  PHYSICAL EXAM GENERAL:alert, no distress and comfortable SKIN: no rash  EYES: sclera clear NECK: without mass LYMPH:  no palpable cervical or supraclavicular lymphadenopathy  LUNGS: clear with normal breathing effort HEART: regular rate & rhythm, no lower extremity edema ABDOMEN: abdomen soft, non-tender and normal bowel sounds NEURO: alert & oriented x 3 with fluent speech, no focal motor/sensory deficits Breast exam:  PAC without erythema    CBC    Latest Ref Rng & Units 04/30/2024    2:47 PM 11/13/2023   10:06 AM 02/05/2023   10:15 AM  CBC  WBC 4.0 - 10.5 K/uL 7.0  7.2  4.6   Hemoglobin 12.0 - 15.0 g/dL 87.0  87.4  88.1   Hematocrit 36.0 - 46.0 % 39.6  39.6  37.0   Platelets 150 - 400 K/uL 365  325  302       CMP     Latest Ref Rng & Units 04/30/2024    2:47 PM 11/13/2023   10:06 AM 02/05/2023   10:15 AM  CMP  Glucose 70 - 99 mg/dL 99  98  887   BUN 6 - 20 mg/dL 12  16  12    Creatinine 0.44 - 1.00 mg/dL 9.06  9.15  9.10   Sodium 135 - 145 mmol/L 140  142  142   Potassium 3.5 - 5.1 mmol/L 3.6  3.9  3.7   Chloride 98 - 111 mmol/L 103  108  109   CO2 22 - 32 mmol/L 26  29  27    Calcium 8.9 - 10.3 mg/dL 9.7  9.6  9.5   Total Protein 6.5 - 8.1 g/dL 7.1  6.8  6.8   Total Bilirubin 0.0 - 1.2 mg/dL 0.5  0.6  0.5   Alkaline Phos 38 - 126 U/L 89  80  60   AST 15 - 41 U/L 24  16  17    ALT 0 - 44 U/L 20  12  14        ASSESSMENT & PLAN:53 year old female     Malignant neoplasm of upper-inner quadrant of right breast, Stage IA, p(T1c, N0), ER+/PR+/HER2-, Grade 2  -presented with  palpable right breast lump x6 months. Biopsy 08/20/21 showed IDC, grade 2, Ki67 of 15%. -she was started on tamoxifen  neoadjuvantly due to heavy menses. -S/p right lumpectomy on 10/11/21 by Dr. Curvin showed 1.4 cm invasive and in situ ductal carcinoma. Margins and lymph nodes negative. -  oncotype RS of 16, low risk, Adjuvant chemo is not recommended  -She resumed tamoxifen  in early 10/2021, held 02/2022 for depression/mood swings. Effexor  started and depression improved. Resumed low dose tamoxifen  10 mg 05/05/22, tolerating better -Mammogram 09/23/2022 was benign, breast density category B -Recently underwent total hysterectomy and BSO for heavy bleeding, all path was benign.  She recovered well -Due to postmenopausal status, she started Anastrozole  02/25/23, but stopped after 1 week due to severe nausea. She changed back to Tamoxifen  late 02/2023     Genetics -testing was negative 09/05/21    PLAN:  No orders of the defined types were placed in this encounter.     All questions were answered. The patient knows to call the clinic with any problems, questions or concerns. No barriers to learning were detected. I spent *** counseling the patient face to face. The total time spent in the appointment was *** and more than 50% was on counseling, review of test results, and coordination of care.   Amirah Goerke K Delphin Funes, NP 10/23/2024 5:01 PM  "

## 2024-10-24 ENCOUNTER — Other Ambulatory Visit: Payer: Self-pay

## 2024-10-24 DIAGNOSIS — C50211 Malignant neoplasm of upper-inner quadrant of right female breast: Secondary | ICD-10-CM

## 2024-10-24 DIAGNOSIS — N6310 Unspecified lump in the right breast, unspecified quadrant: Secondary | ICD-10-CM

## 2024-10-25 ENCOUNTER — Inpatient Hospital Stay

## 2024-10-25 ENCOUNTER — Inpatient Hospital Stay: Admitting: Nurse Practitioner

## 2024-10-25 ENCOUNTER — Telehealth: Payer: Self-pay

## 2024-10-25 NOTE — Telephone Encounter (Signed)
 Patient did not arrive to her appointments today. Attempted to contact the patient via telephone call.  LVM to contact facility.  Appointments no showed for today, 10/25/24.
